# Patient Record
Sex: Male | Born: 1965 | Race: White | Hispanic: No | Marital: Single | Smoking: Never smoker
Health system: Southern US, Community
[De-identification: ages and names within clinical notes are randomized; demographics above are authoritative.]

## PROBLEM LIST (undated history)

## (undated) DIAGNOSIS — E538 Deficiency of other specified B group vitamins: Secondary | ICD-10-CM

## (undated) DIAGNOSIS — F1010 Alcohol abuse, uncomplicated: Secondary | ICD-10-CM

## (undated) DIAGNOSIS — F1910 Other psychoactive substance abuse, uncomplicated: Secondary | ICD-10-CM

## (undated) DIAGNOSIS — K635 Polyp of colon: Secondary | ICD-10-CM

## (undated) DIAGNOSIS — I219 Acute myocardial infarction, unspecified: Secondary | ICD-10-CM

## (undated) DIAGNOSIS — B009 Herpesviral infection, unspecified: Secondary | ICD-10-CM

## (undated) HISTORY — PX: BLADDER SURGERY: SHX569

## (undated) HISTORY — DX: Polyp of colon: K63.5

## (undated) HISTORY — DX: Deficiency of other specified B group vitamins: E53.8

## (undated) HISTORY — DX: Herpesviral infection, unspecified: B00.9

## (undated) HISTORY — PX: KNEE ARTHROSCOPY: SHX127

---

## 2005-12-21 ENCOUNTER — Emergency Department (HOSPITAL_COMMUNITY): Admission: EM | Admit: 2005-12-21 | Discharge: 2005-12-21 | Payer: Self-pay | Admitting: Emergency Medicine

## 2012-03-19 ENCOUNTER — Emergency Department: Payer: Self-pay | Admitting: Internal Medicine

## 2014-07-30 DIAGNOSIS — K635 Polyp of colon: Secondary | ICD-10-CM

## 2014-07-30 HISTORY — DX: Polyp of colon: K63.5

## 2015-06-02 ENCOUNTER — Other Ambulatory Visit: Payer: Self-pay | Admitting: Gastroenterology

## 2015-06-02 DIAGNOSIS — K921 Melena: Secondary | ICD-10-CM

## 2015-06-09 ENCOUNTER — Ambulatory Visit
Admission: RE | Admit: 2015-06-09 | Discharge: 2015-06-09 | Disposition: A | Discharge status: Home / Self Care | Payer: 59 | Source: Ambulatory Visit | Attending: Gastroenterology | Admitting: Gastroenterology

## 2015-06-09 DIAGNOSIS — K921 Melena: Secondary | ICD-10-CM | POA: Insufficient documentation

## 2015-06-09 DIAGNOSIS — Z7289 Other problems related to lifestyle: Secondary | ICD-10-CM | POA: Diagnosis present

## 2015-06-09 DIAGNOSIS — K760 Fatty (change of) liver, not elsewhere classified: Secondary | ICD-10-CM | POA: Insufficient documentation

## 2015-07-06 HISTORY — PX: COLONOSCOPY: SHX174

## 2015-07-06 HISTORY — PX: UPPER GASTROINTESTINAL ENDOSCOPY: SHX188

## 2016-03-21 ENCOUNTER — Encounter: Payer: Self-pay | Admitting: *Deleted

## 2016-03-22 ENCOUNTER — Ambulatory Visit (INDEPENDENT_AMBULATORY_CARE_PROVIDER_SITE_OTHER): Payer: No Typology Code available for payment source | Admitting: General Surgery

## 2016-03-22 ENCOUNTER — Encounter: Payer: Self-pay | Admitting: General Surgery

## 2016-03-22 VITALS — BP 112/80 | HR 88 | Resp 14 | Ht 75.0 in | Wt 200.0 lb

## 2016-03-22 DIAGNOSIS — K6289 Other specified diseases of anus and rectum: Secondary | ICD-10-CM

## 2016-03-22 DIAGNOSIS — R198 Other specified symptoms and signs involving the digestive system and abdomen: Secondary | ICD-10-CM

## 2016-03-22 NOTE — Patient Instructions (Addendum)
The patient is aware to call back for any questions or concerns.  The patient is scheduled for surgery at [facility] on 03/28/16. He will pre admit by phone, the patent is aware of date and instructions.

## 2016-03-22 NOTE — Progress Notes (Addendum)
Patient ID: Edward [name], male   DOB: 03/01/1966, 49 y.o.   MRN: [id number]  Chief Complaint  Patient presents with  . Rectal Problems    mass    HPI Edward [name] is a 49 y.o. male.  Here today for evaluation of palpable anal mass. He states he noticed a mass about 1-2 months ago after using a port a potty. He states it is about the size of jelly bean. He took Bactrim but it did not help. Denies rectal pain. He is here today with Edward [name], girlfriend.   HPI  Past Medical History:  Diagnosis Date  . Colon polyp 2016  . Herpes   . Vitamin B 12 deficiency     Past Surgical History:  Procedure Laterality Date  . BLADDER SURGERY    . COLONOSCOPY  07/06/2015   Dr Rein  . KNEE ARTHROSCOPY Left   . UPPER GASTROINTESTINAL ENDOSCOPY  07/06/2015    Family History  Problem Relation Age of Onset  . Colon cancer Maternal Grandmother   . Prostate cancer Paternal Uncle   . Leukemia Paternal Uncle     Social History Social History  Substance Use Topics  . Smoking status: Never Smoker  . Smokeless tobacco: Never Used  . Alcohol use Yes    Allergies  Allergen Reactions  . Codeine Nausea And Vomiting    Current Outpatient Prescriptions  Medication Sig Dispense Refill  . co-enzyme Q-10 30 MG capsule Take 30 mg by mouth daily.    . Omega-3 Fatty Acids (FISH OIL) 1000 MG CPDR Take by mouth.    . vitamin B-12 (CYANOCOBALAMIN) 500 MCG tablet Take 500 mcg by mouth daily.    . Zinc Sulfate (ZINC-220 PO) Take by mouth.     No current facility-administered medications for this visit.     Review of Systems Review of Systems  Constitutional: Negative.   Respiratory: Negative.     Blood pressure 112/80, pulse 88, resp. rate 14, height 6' 3" (1.905 m), weight 200 lb (90.7 kg).  Physical Exam Physical Exam  Constitutional: He is oriented to person, place, and time. He appears well-developed and well-nourished.  HENT:  Mouth/Throat: Oropharynx is clear and moist.  Eyes:  Conjunctivae are normal. No scleral icterus.  Neck: Neck supple.  Cardiovascular: Normal rate, regular rhythm and normal heart sounds.   Pulmonary/Chest: Effort normal and breath sounds normal.  Abdominal: Soft. Normal appearance and bowel sounds are normal. There is no tenderness.  Genitourinary:  Genitourinary Comments: 1 cm firm smooth perianal mass right side 9 o'clock   Lymphadenopathy:    He has no cervical adenopathy.  Neurological: He is alert and oriented to person, place, and time.  Skin: Skin is warm and dry.  Psychiatric: His behavior is normal.    Data Reviewed Progress notes, prior colonoscopy.  Assessment    Perianal mass-uncertain nature    Plan    Recommend excision. Pt is agreeable.    The patient is scheduled for surgery at [facility] on 03/28/16. He will pre admit by phone, the patent is aware of date and instructions.   This information has been scribed by Marsha Hatch RN, BSN,BC.    SANKAR,SEEPLAPUTHUR G 03/23/2016, 10:01 AM   

## 2016-03-23 ENCOUNTER — Encounter: Payer: Self-pay | Admitting: General Surgery

## 2016-03-26 ENCOUNTER — Other Ambulatory Visit: Payer: PRIVATE HEALTH INSURANCE

## 2016-03-26 ENCOUNTER — Encounter: Payer: Self-pay | Admitting: *Deleted

## 2016-03-26 NOTE — Patient Instructions (Signed)
  Your procedure is scheduled on: 03/28/16 Report to Day Surgery. [facility]OND FLOOR To find out your arrival time please call [phone] between 1PM - 3PM on  03/27/16  Remember: Instructions that are not followed completely may result in serious medical risk, up to and including death, or upon the discretion of your surgeon and anesthesiologist your surgery may need to be rescheduled.    _X___ 1. Do not eat food or drink liquids after midnight. No gum chewing or hard candies.     __X__ 2. No Alcohol for 24 hours before or after surgery.   _X___ 3. Do Not Smoke For 24 Hours Prior to Your Surgery.   ____ 4. Bring all medications with you on the day of surgery if instructed.    __X__ 5. Notify your doctor if there is any change in your medical condition     (cold, fever, infections).       Do not wear jewelry, make-up, hairpins, clips or nail polish.  Do not wear lotions, powders, or perfumes. You may wear deodorant.  Do not shave 48 hours prior to surgery. Men may shave face and neck.  Do not bring valuables to the hospital.    [facility] is not responsible for any belongings or valuables.               Contacts, dentures or bridgework may not be worn into surgery.  Leave your suitcase in the car. After surgery it may be brought to your room.  For patients admitted to the hospital, discharge time is determined by your                treatment team.   Patients discharged the day of surgery will not be allowed to drive home.   Please read over the following fact sheets that you were given:   Surgical Site Infection Prevention   ____ Take these medicines the morning of surgery with A SIP OF WATER:    1. NONE  2.   3.   4.  5.  6.  __X__ Fleet Enema (as directed) 1-2 HOURS BEFORE COMING TO HOSPITAL AM SURGERY  __X__ Use CHG Soap as directed  ____ Use inhalers on the day of surgery  ____ Stop metformin 2 days prior to surgery    ____ Take 1/2 of usual insulin  dose the night before surgery and none on the morning of surgery.   ____ Stop Coumadin/Plavix/aspirin on   ____ Stop Anti-inflammatories on   _X___ Stop supplements until after surgery.    ____ Bring C-Pap to the hospital.

## 2016-03-27 ENCOUNTER — Encounter
Admission: RE | Admit: 2016-03-27 | Discharge: 2016-03-27 | Disposition: A | Discharge status: Home / Self Care | Payer: No Typology Code available for payment source | Source: Ambulatory Visit | Attending: General Surgery | Admitting: General Surgery

## 2016-03-28 ENCOUNTER — Ambulatory Visit: Payer: No Typology Code available for payment source | Admitting: Anesthesiology

## 2016-03-28 ENCOUNTER — Ambulatory Visit
Admission: RE | Admit: 2016-03-28 | Discharge: 2016-03-28 | Disposition: A | Discharge status: Home / Self Care | Payer: No Typology Code available for payment source | Source: Ambulatory Visit | Attending: General Surgery | Admitting: General Surgery

## 2016-03-28 ENCOUNTER — Encounter: Payer: Self-pay | Admitting: *Deleted

## 2016-03-28 ENCOUNTER — Encounter
Admission: RE | Disposition: A | Discharge status: Home / Self Care | Payer: Self-pay | Source: Ambulatory Visit | Attending: General Surgery

## 2016-03-28 DIAGNOSIS — K603 Anal fistula: Secondary | ICD-10-CM | POA: Diagnosis not present

## 2016-03-28 DIAGNOSIS — K6289 Other specified diseases of anus and rectum: Secondary | ICD-10-CM

## 2016-03-28 DIAGNOSIS — Z539 Procedure and treatment not carried out, unspecified reason: Secondary | ICD-10-CM | POA: Insufficient documentation

## 2016-03-28 LAB — URINE DRUG SCREEN, QUALITATIVE (ARMC ONLY)
Amphetamines, Ur Screen: NOT DETECTED
BARBITURATES, UR SCREEN: NOT DETECTED
Benzodiazepine, Ur Scrn: NOT DETECTED
COCAINE METABOLITE, UR ~~LOC~~: POSITIVE — AB
Cannabinoid 50 Ng, Ur ~~LOC~~: POSITIVE — AB
MDMA (ECSTASY) UR SCREEN: NOT DETECTED
METHADONE SCREEN, URINE: NOT DETECTED
Opiate, Ur Screen: NOT DETECTED
Phencyclidine (PCP) Ur S: NOT DETECTED
TRICYCLIC, UR SCREEN: NOT DETECTED

## 2016-03-28 SURGERY — EXCISION MASS LOWER EXTREMITIES
Anesthesia: General

## 2016-03-28 MED ORDER — BACITRACIN ZINC 500 UNIT/GM EX OINT
TOPICAL_OINTMENT | CUTANEOUS | Status: AC
  Filled 2016-03-28: qty 28.35

## 2016-03-28 MED ORDER — LACTATED RINGERS IV SOLN: INTRAVENOUS | Status: DC

## 2016-03-28 MED ORDER — FAMOTIDINE 20 MG PO TABS
20.0000 mg | ORAL_TABLET | Freq: Once | ORAL | Status: AC
  Administered 2016-03-28: 20 mg via ORAL

## 2016-03-28 MED ORDER — BUPIVACAINE-EPINEPHRINE (PF) 0.5% -1:200000 IJ SOLN
INTRAMUSCULAR | Status: AC
  Filled 2016-03-28: qty 30

## 2016-03-28 MED ORDER — FLEET ENEMA 7-19 GM/118ML RE ENEM: 1.0000 | ENEMA | Freq: Once | RECTAL | Status: DC

## 2016-03-28 MED ORDER — FAMOTIDINE 20 MG PO TABS
ORAL_TABLET | ORAL | Status: AC
  Filled 2016-03-28: qty 1

## 2016-03-28 SURGICAL SUPPLY — 23 items
BRIEF STRETCH MATERNITY 2XLG (MISCELLANEOUS) ×3 IMPLANT
CANISTER SUCT 1200ML W/VALVE (MISCELLANEOUS) ×3 IMPLANT
DRAPE LAPAROTOMY 77X122 PED (DRAPES) ×3 IMPLANT
DRAPE LEGGINS SURG 28X43 STRL (DRAPES) ×3 IMPLANT
DRAPE UNDER BUTTOCK W/FLU (DRAPES) ×3 IMPLANT
ELECT REM PT RETURN 9FT ADLT (ELECTROSURGICAL) ×3
ELECTRODE REM PT RTRN 9FT ADLT (ELECTROSURGICAL) ×1 IMPLANT
GAUZE SPONGE 4X4 12PLY STRL (GAUZE/BANDAGES/DRESSINGS) ×3 IMPLANT
GLOVE BIO SURGEON STRL SZ7 (GLOVE) ×3 IMPLANT
GOWN STRL REUS W/ TWL LRG LVL3 (GOWN DISPOSABLE) ×2 IMPLANT
GOWN STRL REUS W/TWL LRG LVL3 (GOWN DISPOSABLE) ×4
KIT RM TURNOVER CYSTO AR (KITS) ×3 IMPLANT
LABEL OR SOLS (LABEL) ×3 IMPLANT
NEEDLE HYPO 25X1 1.5 SAFETY (NEEDLE) ×3 IMPLANT
NS IRRIG 500ML POUR BTL (IV SOLUTION) ×3 IMPLANT
PACK BASIN MINOR ARMC (MISCELLANEOUS) IMPLANT
PAD ABD DERMACEA PRESS 5X9 (GAUZE/BANDAGES/DRESSINGS) ×3 IMPLANT
PAD PREP 24X41 OB/GYN DISP (PERSONAL CARE ITEMS) ×3 IMPLANT
SOL PREP PVP 2OZ (MISCELLANEOUS) ×3
SOLUTION PREP PVP 2OZ (MISCELLANEOUS) ×1 IMPLANT
SURGILUBE 2OZ TUBE FLIPTOP (MISCELLANEOUS) ×3 IMPLANT
SUT CHROMIC 0 SH (SUTURE) IMPLANT
SYRINGE 10CC LL (SYRINGE) ×3 IMPLANT

## 2016-03-28 NOTE — H&P (View-Only) (Signed)
Patient ID: Edward [name], male   DOB: 09/05/1965, 49 y.o.   MRN: [id number]  Chief Complaint  Patient presents with  . Rectal Problems    mass    HPI Edward [name] is a 49 y.o. male.  Here today for evaluation of palpable anal mass. He states he noticed a mass about 1-2 months ago after using a port a potty. He states it is about the size of jelly bean. He took Bactrim but it did not help. Denies rectal pain. He is here today with Edward [name], girlfriend.   HPI  Past Medical History:  Diagnosis Date  . Colon polyp 2016  . Herpes   . Vitamin B 12 deficiency     Past Surgical History:  Procedure Laterality Date  . BLADDER SURGERY    . COLONOSCOPY  07/06/2015   Dr Rein  . KNEE ARTHROSCOPY Left   . UPPER GASTROINTESTINAL ENDOSCOPY  07/06/2015    Family History  Problem Relation Age of Onset  . Colon cancer Maternal Grandmother   . Prostate cancer Paternal Uncle   . Leukemia Paternal Uncle     Social History Social History  Substance Use Topics  . Smoking status: Never Smoker  . Smokeless tobacco: Never Used  . Alcohol use Yes    Allergies  Allergen Reactions  . Codeine Nausea And Vomiting    Current Outpatient Prescriptions  Medication Sig Dispense Refill  . co-enzyme Q-10 30 MG capsule Take 30 mg by mouth daily.    . Omega-3 Fatty Acids (FISH OIL) 1000 MG CPDR Take by mouth.    . vitamin B-12 (CYANOCOBALAMIN) 500 MCG tablet Take 500 mcg by mouth daily.    . Zinc Sulfate (ZINC-220 PO) Take by mouth.     No current facility-administered medications for this visit.     Review of Systems Review of Systems  Constitutional: Negative.   Respiratory: Negative.     Blood pressure 112/80, pulse 88, resp. rate 14, height 6' 3" (1.905 m), weight 200 lb (90.7 kg).  Physical Exam Physical Exam  Constitutional: He is oriented to person, place, and time. He appears well-developed and well-nourished.  HENT:  Mouth/Throat: Oropharynx is clear and moist.  Eyes:  Conjunctivae are normal. No scleral icterus.  Neck: Neck supple.  Cardiovascular: Normal rate, regular rhythm and normal heart sounds.   Pulmonary/Chest: Effort normal and breath sounds normal.  Abdominal: Soft. Normal appearance and bowel sounds are normal. There is no tenderness.  Genitourinary:  Genitourinary Comments: 1 cm firm smooth perianal mass right side 9 o'clock   Lymphadenopathy:    He has no cervical adenopathy.  Neurological: He is alert and oriented to person, place, and time.  Skin: Skin is warm and dry.  Psychiatric: His behavior is normal.    Data Reviewed Progress notes, prior colonoscopy.  Assessment    Perianal mass-uncertain nature    Plan    Recommend excision. Pt is agreeable.    The patient is scheduled for surgery at [facility] on 03/28/16. He will pre admit by phone, the patent is aware of date and instructions.   This information has been scribed by Marsha Hatch RN, BSN,BC.    SANKAR,SEEPLAPUTHUR G 03/23/2016, 10:01 AM   

## 2016-03-28 NOTE — Progress Notes (Signed)
+  UDS for cocaine. Dr. Evette Cristal and anesthesia notified. Both spoke with patient and family. Surgery cancelled and will be rescheduled per MD and patient. IV DCed and patient left ambulatory with family.

## 2016-03-28 NOTE — Interval H&P Note (Signed)
History and Physical Interval Note:  03/28/2016 2:24 PM  Edward [name]  has presented today for surgery, with the diagnosis of PERIANAL MASS  The various methods of treatment have been discussed with the patient and family. After consideration of risks, benefits and other options for treatment, the patient has consented to  Procedure(s): EXCISION MASS PERIANAL (N/A) as a surgical intervention .  The patient's history has been reviewed, patient examined, no change in status, stable for surgery.  I have reviewed the patient's chart and labs.  Questions were answered to the patient's satisfaction.     [name]

## 2016-03-28 NOTE — Anesthesia Preprocedure Evaluation (Deleted)
Anesthesia Evaluation  Patient identified by MRN, date of birth, ID band Patient awake    Reviewed: Allergy & Precautions, NPO status , Patient's Chart, lab work & pertinent test results  History of Anesthesia Complications Negative for: history of anesthetic complications  Airway Mallampati: II       Dental  (+) Teeth Intact   Pulmonary neg pulmonary ROS,    breath sounds clear to auscultation       Cardiovascular negative cardio ROS   Rhythm:Regular     Neuro/Psych negative neurological ROS     GI/Hepatic negative GI ROS, Neg liver ROS,   Endo/Other  negative endocrine ROS  Renal/GU negative Renal ROS     Musculoskeletal negative musculoskeletal ROS (+)   Abdominal Normal abdominal exam  (+)   Peds negative pediatric ROS (+)  Hematology negative hematology ROS (+)   Anesthesia Other Findings   Reproductive/Obstetrics                             Anesthesia Physical Anesthesia Plan  ASA: II  Anesthesia Plan: General   Post-op Pain Management:    Induction: Intravenous  Airway Management Planned: LMA  Additional Equipment:   Intra-op Plan:   Post-operative Plan: Extubation in OR  Informed Consent: I have reviewed the patients History and Physical, chart, labs and discussed the procedure including the risks, benefits and alternatives for the proposed anesthesia with the patient or authorized representative who has indicated his/her understanding and acceptance.     Plan Discussed with: CRNA  Anesthesia Plan Comments:         Anesthesia Quick Evaluation

## 2016-03-30 ENCOUNTER — Other Ambulatory Visit: Payer: Self-pay | Admitting: General Surgery

## 2016-03-30 DIAGNOSIS — K6289 Other specified diseases of anus and rectum: Secondary | ICD-10-CM

## 2016-04-03 ENCOUNTER — Encounter: Payer: Self-pay | Admitting: *Deleted

## 2016-04-03 ENCOUNTER — Ambulatory Visit
Admission: RE | Admit: 2016-04-03 | Discharge: 2016-04-03 | Disposition: A | Discharge status: Home / Self Care | Payer: No Typology Code available for payment source | Source: Ambulatory Visit | Attending: General Surgery | Admitting: General Surgery

## 2016-04-03 ENCOUNTER — Ambulatory Visit: Payer: No Typology Code available for payment source | Admitting: Certified Registered Nurse Anesthetist

## 2016-04-03 ENCOUNTER — Encounter
Admission: RE | Disposition: A | Discharge status: Home / Self Care | Payer: Self-pay | Source: Ambulatory Visit | Attending: General Surgery

## 2016-04-03 DIAGNOSIS — Z806 Family history of leukemia: Secondary | ICD-10-CM | POA: Insufficient documentation

## 2016-04-03 DIAGNOSIS — Z800 Family history of malignant neoplasm of digestive organs: Secondary | ICD-10-CM | POA: Diagnosis not present

## 2016-04-03 DIAGNOSIS — Z885 Allergy status to narcotic agent status: Secondary | ICD-10-CM | POA: Insufficient documentation

## 2016-04-03 DIAGNOSIS — E538 Deficiency of other specified B group vitamins: Secondary | ICD-10-CM | POA: Diagnosis not present

## 2016-04-03 DIAGNOSIS — K6289 Other specified diseases of anus and rectum: Secondary | ICD-10-CM

## 2016-04-03 DIAGNOSIS — K603 Anal fistula: Secondary | ICD-10-CM | POA: Insufficient documentation

## 2016-04-03 DIAGNOSIS — Z79899 Other long term (current) drug therapy: Secondary | ICD-10-CM | POA: Diagnosis not present

## 2016-04-03 DIAGNOSIS — B009 Herpesviral infection, unspecified: Secondary | ICD-10-CM | POA: Diagnosis not present

## 2016-04-03 DIAGNOSIS — Z86010 Personal history of colonic polyps: Secondary | ICD-10-CM | POA: Diagnosis not present

## 2016-04-03 DIAGNOSIS — Z9889 Other specified postprocedural states: Secondary | ICD-10-CM | POA: Insufficient documentation

## 2016-04-03 HISTORY — PX: ANAL FISTULOTOMY: SHX6423

## 2016-04-03 LAB — URINE DRUG SCREEN, QUALITATIVE (ARMC ONLY)
AMPHETAMINES, UR SCREEN: NOT DETECTED
BENZODIAZEPINE, UR SCRN: NOT DETECTED
Barbiturates, Ur Screen: NOT DETECTED
COCAINE METABOLITE, UR ~~LOC~~: NOT DETECTED
Cannabinoid 50 Ng, Ur ~~LOC~~: POSITIVE — AB
MDMA (Ecstasy)Ur Screen: NOT DETECTED
METHADONE SCREEN, URINE: NOT DETECTED
OPIATE, UR SCREEN: NOT DETECTED
Phencyclidine (PCP) Ur S: NOT DETECTED
Tricyclic, Ur Screen: NOT DETECTED

## 2016-04-03 SURGERY — ANAL FISTULOTOMY
Anesthesia: Monitor Anesthesia Care | Wound class: Clean Contaminated

## 2016-04-03 MED ORDER — ONDANSETRON HCL 4 MG/2ML IJ SOLN: 4.0000 mg | Freq: Once | INTRAMUSCULAR | Status: DC | PRN

## 2016-04-03 MED ORDER — FLEET ENEMA 7-19 GM/118ML RE ENEM
1.0000 | ENEMA | Freq: Once | RECTAL | Status: DC
Start: 2016-04-04 — End: 2016-04-03

## 2016-04-03 MED ORDER — FAMOTIDINE 20 MG PO TABS
20.0000 mg | ORAL_TABLET | Freq: Once | ORAL | Status: AC
  Administered 2016-04-03: 20 mg via ORAL

## 2016-04-03 MED ORDER — BACITRACIN 500 UNIT/GM EX OINT
TOPICAL_OINTMENT | CUTANEOUS | Status: DC | PRN
  Administered 2016-04-03: 1 via TOPICAL

## 2016-04-03 MED ORDER — BUPIVACAINE-EPINEPHRINE (PF) 0.5% -1:200000 IJ SOLN
INTRAMUSCULAR | Status: AC
  Filled 2016-04-03: qty 30

## 2016-04-03 MED ORDER — HYDRALAZINE HCL 20 MG/ML IJ SOLN
10.0000 mg | Freq: Once | INTRAMUSCULAR | Status: AC
  Administered 2016-04-03: 10 mg via INTRAVENOUS

## 2016-04-03 MED ORDER — FENTANYL CITRATE (PF) 100 MCG/2ML IJ SOLN
INTRAMUSCULAR | Status: DC | PRN
  Administered 2016-04-03 (×2): 25 ug via INTRAVENOUS
  Administered 2016-04-03: 50 ug via INTRAVENOUS

## 2016-04-03 MED ORDER — FENTANYL CITRATE (PF) 100 MCG/2ML IJ SOLN: 25.0000 ug | INTRAMUSCULAR | Status: DC | PRN

## 2016-04-03 MED ORDER — LIDOCAINE HCL (CARDIAC) 20 MG/ML IV SOLN
INTRAVENOUS | Status: DC | PRN
  Administered 2016-04-03: 60 mg via INTRAVENOUS

## 2016-04-03 MED ORDER — BUPIVACAINE-EPINEPHRINE (PF) 0.5% -1:200000 IJ SOLN
INTRAMUSCULAR | Status: DC | PRN
  Administered 2016-04-03: 15 mL via PERINEURAL

## 2016-04-03 MED ORDER — PROPOFOL 10 MG/ML IV BOLUS
INTRAVENOUS | Status: DC | PRN
  Administered 2016-04-03: 200 mg via INTRAVENOUS

## 2016-04-03 MED ORDER — LACTATED RINGERS IV SOLN
INTRAVENOUS | Status: DC
  Administered 2016-04-03 (×2): via INTRAVENOUS

## 2016-04-03 MED ORDER — KETOROLAC TROMETHAMINE 30 MG/ML IJ SOLN
INTRAMUSCULAR | Status: DC | PRN
  Administered 2016-04-03: 30 mg via INTRAVENOUS

## 2016-04-03 MED ORDER — OXYCODONE-ACETAMINOPHEN 5-325 MG PO TABS: 1.0000 | ORAL_TABLET | ORAL | 0 refills | Status: DC | PRN

## 2016-04-03 MED ORDER — HYDRALAZINE HCL 20 MG/ML IJ SOLN
INTRAMUSCULAR | Status: AC
  Administered 2016-04-03: 10 mg via INTRAVENOUS
  Filled 2016-04-03: qty 1

## 2016-04-03 MED ORDER — MIDAZOLAM HCL 5 MG/5ML IJ SOLN
INTRAMUSCULAR | Status: DC | PRN
  Administered 2016-04-03: 2 mg via INTRAVENOUS

## 2016-04-03 MED ORDER — FAMOTIDINE 20 MG PO TABS
ORAL_TABLET | ORAL | Status: AC
  Filled 2016-04-03: qty 1

## 2016-04-03 MED ORDER — ONDANSETRON HCL 4 MG/2ML IJ SOLN
INTRAMUSCULAR | Status: DC | PRN
  Administered 2016-04-03: 4 mg via INTRAVENOUS

## 2016-04-03 MED ORDER — BACITRACIN ZINC 500 UNIT/GM EX OINT
TOPICAL_OINTMENT | CUTANEOUS | Status: AC
  Filled 2016-04-03: qty 28.35

## 2016-04-03 SURGICAL SUPPLY — 23 items
BRIEF STRETCH MATERNITY 2XLG (MISCELLANEOUS) ×3 IMPLANT
CANISTER SUCT 1200ML W/VALVE (MISCELLANEOUS) ×3 IMPLANT
DRAPE LAPAROTOMY 77X122 PED (DRAPES) ×3 IMPLANT
DRAPE LEGGINS SURG 28X43 STRL (DRAPES) ×3 IMPLANT
DRAPE UNDER BUTTOCK W/FLU (DRAPES) ×3 IMPLANT
ELECT REM PT RETURN 9FT ADLT (ELECTROSURGICAL) ×3
ELECTRODE REM PT RTRN 9FT ADLT (ELECTROSURGICAL) ×2 IMPLANT
GAUZE SPONGE 4X4 12PLY STRL (GAUZE/BANDAGES/DRESSINGS) IMPLANT
GLOVE BIO SURGEON STRL SZ7 (GLOVE) ×18 IMPLANT
GOWN STRL REUS W/ TWL LRG LVL3 (GOWN DISPOSABLE) ×6 IMPLANT
GOWN STRL REUS W/TWL LRG LVL3 (GOWN DISPOSABLE) ×3
KIT RM TURNOVER CYSTO AR (KITS) ×3 IMPLANT
LABEL OR SOLS (LABEL) ×3 IMPLANT
NEEDLE HYPO 25X1 1.5 SAFETY (NEEDLE) ×3 IMPLANT
NS IRRIG 500ML POUR BTL (IV SOLUTION) ×3 IMPLANT
PACK BASIN MINOR ARMC (MISCELLANEOUS) ×3 IMPLANT
PAD ABD DERMACEA PRESS 5X9 (GAUZE/BANDAGES/DRESSINGS) ×3 IMPLANT
PAD PREP 24X41 OB/GYN DISP (PERSONAL CARE ITEMS) ×3 IMPLANT
SOL PREP PVP 2OZ (MISCELLANEOUS) ×3
SOLUTION PREP PVP 2OZ (MISCELLANEOUS) ×2 IMPLANT
SURGILUBE 2OZ TUBE FLIPTOP (MISCELLANEOUS) ×3 IMPLANT
SUT CHROMIC 0 SH (SUTURE) IMPLANT
SYRINGE 10CC LL (SYRINGE) ×3 IMPLANT

## 2016-04-03 NOTE — Anesthesia Preprocedure Evaluation (Signed)
Anesthesia Evaluation  Patient identified by MRN, date of birth, ID band Patient awake    Reviewed: Allergy & Precautions, H&P , NPO status , Patient's Chart, lab work & pertinent test results, reviewed documented beta blocker date and time   Airway Mallampati: II  TM Distance: >3 FB Neck ROM: full    Dental no notable dental hx. (+) Teeth Intact   Pulmonary neg pulmonary ROS,    Pulmonary exam normal breath sounds clear to auscultation       Cardiovascular Exercise Tolerance: Good hypertension, negative cardio ROS   Rhythm:regular Rate:Normal     Neuro/Psych negative neurological ROS  negative psych ROS   GI/Hepatic negative GI ROS, Neg liver ROS,   Endo/Other  negative endocrine ROSdiabetes  Renal/GU      Musculoskeletal   Abdominal   Peds  Hematology negative hematology ROS (+)   Anesthesia Other Findings   Reproductive/Obstetrics negative OB ROS                             Anesthesia Physical Anesthesia Plan  ASA: II  Anesthesia Plan: MAC   Post-op Pain Management:    Induction:   Airway Management Planned:   Additional Equipment:   Intra-op Plan:   Post-operative Plan:   Informed Consent: I have reviewed the patients History and Physical, chart, labs and discussed the procedure including the risks, benefits and alternatives for the proposed anesthesia with the patient or authorized representative who has indicated his/her understanding and acceptance.     Plan Discussed with: CRNA  Anesthesia Plan Comments:         Anesthesia Quick Evaluation  

## 2016-04-03 NOTE — Progress Notes (Signed)
Final dose of hydralazine given  Heart rate 51  Blood pressure 148/98

## 2016-04-03 NOTE — Progress Notes (Signed)
Heart rate ranging from mid 40's to low to mid 50's  Blood pressure DBP ranging mid 80" to 90"   Dr Pernell Dupre ordered hydralalzine 10 mg to follow with 10mg  in 15 minutes

## 2016-04-03 NOTE — H&P (View-Only) (Signed)
Patient ID: Edward [name], male   DOB: 05-27-1966, 50 y.o.   MRN: [id number]  Chief Complaint  Patient presents with  . Rectal Problems    mass    HPI MICHOLAS [name] is a 50 y.o. male.  Here today for evaluation of palpable anal mass. He states he noticed a mass about 1-2 months ago after using a port a potty. He states it is about the size of jelly bean. He took Bactrim but it did not help. Denies rectal pain. He is here today with [name], girlfriend.   HPI  Past Medical History:  Diagnosis Date  . Colon polyp 2016  . Herpes   . Vitamin B 12 deficiency     Past Surgical History:  Procedure Laterality Date  . BLADDER SURGERY    . COLONOSCOPY  07/06/2015   Dr Shelle Iron  . KNEE ARTHROSCOPY Left   . UPPER GASTROINTESTINAL ENDOSCOPY  07/06/2015    Family History  Problem Relation Age of Onset  . Colon cancer Maternal Grandmother   . Prostate cancer Paternal Uncle   . Leukemia Paternal Uncle     Social History Social History  Substance Use Topics  . Smoking status: Never Smoker  . Smokeless tobacco: Never Used  . Alcohol use Yes    Allergies  Allergen Reactions  . Codeine Nausea And Vomiting    Current Outpatient Prescriptions  Medication Sig Dispense Refill  . co-enzyme Q-10 30 MG capsule Take 30 mg by mouth daily.    . Omega-3 Fatty Acids (FISH OIL) 1000 MG CPDR Take by mouth.    . vitamin B-12 (CYANOCOBALAMIN) 500 MCG tablet Take 500 mcg by mouth daily.    . Zinc Sulfate (ZINC-220 PO) Take by mouth.     No current facility-administered medications for this visit.     Review of Systems Review of Systems  Constitutional: Negative.   Respiratory: Negative.     Blood pressure 112/80, pulse 88, resp. rate 14, height 6\' 3"  (1.905 m), weight 200 lb (90.7 kg).  Physical Exam Physical Exam  Constitutional: He is oriented to person, place, and time. He appears well-developed and well-nourished.  HENT:  Mouth/Throat: Oropharynx is clear and moist.  Eyes:  Conjunctivae are normal. No scleral icterus.  Neck: Neck supple.  Cardiovascular: Normal rate, regular rhythm and normal heart sounds.   Pulmonary/Chest: Effort normal and breath sounds normal.  Abdominal: Soft. Normal appearance and bowel sounds are normal. There is no tenderness.  Genitourinary:  Genitourinary Comments: 1 cm firm smooth perianal mass right side 9 o'clock   Lymphadenopathy:    He has no cervical adenopathy.  Neurological: He is alert and oriented to person, place, and time.  Skin: Skin is warm and dry.  Psychiatric: His behavior is normal.    Data Reviewed Progress notes, prior colonoscopy.  Assessment    Perianal mass-uncertain nature    Plan    Recommend excision. Pt is agreeable.    The patient is scheduled for surgery at [facility] on 03/28/16. He will pre admit by phone, the patent is aware of date and instructions.   This information has been scribed by Dorathy Daft RN, BSN,BC.    SANKAR,SEEPLAPUTHUR G 03/23/2016, 10:01 AM

## 2016-04-03 NOTE — Anesthesia Procedure Notes (Signed)
Procedure Name: LMA Insertion Date/Time: 04/03/2016 11:42 AM Performed by: Lily Kocher Pre-anesthesia Checklist: Patient identified, Patient being monitored, Timeout performed, Emergency Drugs available and Suction available Patient Re-evaluated:Patient Re-evaluated prior to inductionOxygen Delivery Method: Circle system utilized Preoxygenation: Pre-oxygenation with 100% oxygen Intubation Type: IV induction Ventilation: Mask ventilation without difficulty LMA: LMA inserted LMA Size: 5.0 Tube type: Oral Number of attempts: 1 Placement Confirmation: positive ETCO2 and breath sounds checked- equal and bilateral Tube secured with: Tape Dental Injury: Teeth and Oropharynx as per pre-operative assessment

## 2016-04-03 NOTE — Discharge Instructions (Signed)

## 2016-04-03 NOTE — Transfer of Care (Signed)
Immediate Anesthesia Transfer of Care Note  Patient: Edward [name]  Procedure(s) Performed: Procedure(s): ANAL FISTULOTOMY  Patient Location: PACU  Anesthesia Type:General  Level of Consciousness: awake  Airway & Oxygen Therapy: Patient Spontanous Breathing  Post-op Assessment: Report given to RN  Post vital signs: stable  Last Vitals:  Vitals:   04/03/16 1024  BP: (!) 166/106  Pulse: 60  Resp: 18  Temp: 37.1 C    Last Pain:  Vitals:   04/03/16 1024  TempSrc: Oral         Complications: No apparent anesthesia complications 

## 2016-04-03 NOTE — Anesthesia Postprocedure Evaluation (Signed)
Anesthesia Post Note  Patient: Edward [name]  Procedure(s) Performed: Procedure(s): ANAL FISTULOTOMY  Patient location during evaluation: PACU Anesthesia Type: General Level of consciousness: awake and alert Pain management: pain level controlled Vital Signs Assessment: post-procedure vital signs reviewed and stable Respiratory status: spontaneous breathing, nonlabored ventilation, respiratory function stable and patient connected to nasal cannula oxygen Cardiovascular status: blood pressure returned to baseline and stable Postop Assessment: no signs of nausea or vomiting Anesthetic complications: no    Last Vitals:  Vitals:   04/03/16 1359 04/03/16 1402  BP: (!) 142/78 (!) 161/81  Pulse: 68 77  Resp: 15 16  Temp: 36.5 C     Last Pain:  Vitals:   04/03/16 1024  TempSrc: Oral                 Yevette Edwards

## 2016-04-03 NOTE — H&P (Signed)
  Pt was scheduled for surgery last week but tested positive for cocaine and surgery was deferred. Pt was advised then on harms of drugs that may cause problems during anesthesia. Pt returns today for the same planned procedure Repeat test shows he is negative for cocaine. Still positive for cannabinoids

## 2016-04-03 NOTE — Progress Notes (Signed)
Heart rate 55   Blood pressure 134/87  Dr Pernell Dupre aware no  New orders

## 2016-04-03 NOTE — Progress Notes (Signed)
Pt. States area to his rectum has gone away, States it burst while he was working a few days Ago, states area is scabbed over.

## 2016-04-03 NOTE — Op Note (Signed)
Preop diagnosis: Perianal mass  Post op diagnosis: Submucous perianal fistula  Operation: Anal fistulotomy  Surgeon: Reina Fuse are  Assistant:     Anesthesia: Gen.  Complications: None  EBL: Minimal  Drains: None  Description: Patient was put to sleep in supine position the operating table and after placed in lithotomy. The anal area was prepped and draped as sterile field and timeout performed. The patient had a palpable 1 cm subcutaneous mass located just to the right of the anal canal but this morning he reported that he had become much smaller and he is not short of fluid drained any. With the careful distal palpation speculum exam and palpation of the skin in the 8:00 location where the mass was felt it was apparent the patient had a small fistulous tract with an obvious internal opening that was easily identified. Accordingly the skin was opened near the external end of this fistulous tract and then a probe was inserted into the tract through the internal opening in this was noted be pink predominantly submucous and subcutaneous the fistula was then laid open completely and cauterized. There did not appear to be any other abnormality in the anterolateral region. The wound was covered with the bacitracin ointment and dressed patient subsequently extubated and returned recovery room stable condition.

## 2016-04-03 NOTE — Interval H&P Note (Signed)
History and Physical Interval Note:  04/03/2016 11:16 AM  Edward [name]  has presented today for surgery, with the diagnosis of PERIANAL MASS  The various methods of treatment have been discussed with the patient and family. After consideration of risks, benefits and other options for treatment, the patient has consented to  Procedure(s): EXCISION PERIANAL MASS (N/A) as a surgical intervention .  The patient's history has been reviewed, patient examined, no change in status, stable for surgery.  I have reviewed the patient's chart and labs.  Questions were answered to the patient's satisfaction.     SANKAR,SEEPLAPUTHUR G

## 2016-04-03 NOTE — Transfer of Care (Signed)
Immediate Anesthesia Transfer of Care Note  Patient: Edward [name]  Procedure(s) Performed: Procedure(s): ANAL FISTULOTOMY  Patient Location: PACU  Anesthesia Type:General  Level of Consciousness: awake  Airway & Oxygen Therapy: Patient Spontanous Breathing  Post-op Assessment: Report given to RN  Post vital signs: stable  Last Vitals:  Vitals:   04/03/16 1024  BP: (!) 166/106  Pulse: 60  Resp: 18  Temp: 37.1 C    Last Pain:  Vitals:   04/03/16 1024  TempSrc: Oral         Complications: No apparent anesthesia complications

## 2016-04-10 ENCOUNTER — Encounter: Payer: Self-pay | Admitting: General Surgery

## 2016-04-10 ENCOUNTER — Ambulatory Visit (INDEPENDENT_AMBULATORY_CARE_PROVIDER_SITE_OTHER): Payer: No Typology Code available for payment source | Admitting: General Surgery

## 2016-04-10 VITALS — BP 140/80 | HR 68 | Resp 12 | Ht 75.0 in | Wt 205.0 lb

## 2016-04-10 DIAGNOSIS — K603 Anal fistula: Secondary | ICD-10-CM

## 2016-04-10 NOTE — Patient Instructions (Signed)
The patient is aware to call back for any questions or concerns.  

## 2016-04-10 NOTE — Progress Notes (Signed)
Patient ID: Edward [name], male   DOB: November 13, 1965, 50 y.o.   MRN: [id number]  Chief Complaint  Patient presents with  . Routine Post Op    HPI Edward [name] is a 50 y.o. male.  Here today for postop visit, anal fistulotomy. Still bleeding some with BM. Bowels are back to routine. Otherwise doing well.  HPI  Past Medical History:  Diagnosis Date  . Colon polyp 2016  . Herpes   . Vitamin B 12 deficiency     Past Surgical History:  Procedure Laterality Date  . ANAL FISTULOTOMY  04/03/2016   Procedure: ANAL FISTULOTOMY;  Surgeon: Kieth Brightly, MD;  Location: [facility] ORS;  Service: General;;  . BLADDER SURGERY    . COLONOSCOPY  07/06/2015   Dr Shelle Iron  . KNEE ARTHROSCOPY Left   . UPPER GASTROINTESTINAL ENDOSCOPY  07/06/2015    Family History  Problem Relation Age of Onset  . Colon cancer Maternal Grandmother   . Prostate cancer Paternal Uncle   . Leukemia Paternal Uncle     Social History Social History  Substance Use Topics  . Smoking status: Never Smoker  . Smokeless tobacco: Never Used  . Alcohol use Yes     Comment: Occasionally     Allergies  Allergen Reactions  . Codeine Nausea And Vomiting    Current Outpatient Prescriptions  Medication Sig Dispense Refill  . co-enzyme Q-10 30 MG capsule Take 30 mg by mouth daily.    . Omega-3 Fatty Acids (FISH OIL) 1000 MG CPDR Take by mouth.    . vitamin B-12 (CYANOCOBALAMIN) 500 MCG tablet Take 500 mcg by mouth daily.    . Zinc Sulfate (ZINC-220 PO) Take by mouth.     No current facility-administered medications for this visit.     Review of Systems Review of Systems  Respiratory: Negative.   Cardiovascular: Negative.   Neurological: Positive for weakness.    Blood pressure 140/80, pulse 68, resp. rate 12, height 6\' 3"  (1.905 m), weight 205 lb (93 kg).  Physical Exam Physical Exam  Constitutional: He is oriented to person, place, and time. He appears well-developed and well-nourished.  Genitourinary:   Genitourinary Comments: Fistulotomy site healing well. No signs of infection noted.  Neurological: He is alert and oriented to person, place, and time.  Skin: Skin is warm and dry.  Psychiatric: His behavior is normal.    Data Reviewed Prior notes.  Assessment    Stable postop exam.    Plan    Follow up one month May return to work      The patient is aware to call back for any questions or concerns. This information has been scribed by Dorathy Daft RN, BSN,BC.   SANKAR,SEEPLAPUTHUR G 04/11/2016, 7:48 PM

## 2016-04-11 ENCOUNTER — Encounter: Payer: Self-pay | Admitting: General Surgery

## 2016-05-09 ENCOUNTER — Ambulatory Visit (INDEPENDENT_AMBULATORY_CARE_PROVIDER_SITE_OTHER): Payer: No Typology Code available for payment source | Admitting: General Surgery

## 2016-05-09 ENCOUNTER — Encounter: Payer: Self-pay | Admitting: General Surgery

## 2016-05-09 VITALS — BP 140/80 | HR 82 | Resp 12 | Ht 71.0 in | Wt 203.0 lb

## 2016-05-09 DIAGNOSIS — K603 Anal fistula: Secondary | ICD-10-CM

## 2016-05-09 NOTE — Progress Notes (Signed)
Patient ID: Edward [name], male   DOB: 04-Mar-1966, 50 y.o.   MRN: [id number]  Chief Complaint  Patient presents with  . Routine Post Op    anal fiaaure    HPI Edward [name] is a 50 y.o. male here today for his post op anal fistulotomy done on 04/03/2016. Patient states he is doing well. No bleeding or pain.  I have reviewed the history of present illness with the patient.  HPI  Past Medical History:  Diagnosis Date  . Colon polyp 2016  . Herpes   . Vitamin B 12 deficiency     Past Surgical History:  Procedure Laterality Date  . ANAL FISTULOTOMY  04/03/2016   Procedure: ANAL FISTULOTOMY;  Surgeon: Kieth Brightly, MD;  Location: [facility] ORS;  Service: General;;  . BLADDER SURGERY    . COLONOSCOPY  07/06/2015   Dr Shelle Iron  . KNEE ARTHROSCOPY Left   . UPPER GASTROINTESTINAL ENDOSCOPY  07/06/2015    Family History  Problem Relation Age of Onset  . Colon cancer Maternal Grandmother   . Prostate cancer Paternal Uncle   . Leukemia Paternal Uncle     Social History Social History  Substance Use Topics  . Smoking status: Never Smoker  . Smokeless tobacco: Never Used  . Alcohol use Yes     Comment: Occasionally     Allergies  Allergen Reactions  . Codeine Nausea And Vomiting    Current Outpatient Prescriptions  Medication Sig Dispense Refill  . co-enzyme Q-10 30 MG capsule Take 30 mg by mouth daily.    . Omega-3 Fatty Acids (FISH OIL) 1000 MG CPDR Take by mouth.    . vitamin B-12 (CYANOCOBALAMIN) 500 MCG tablet Take 500 mcg by mouth daily.    . Zinc Sulfate (ZINC-220 PO) Take by mouth.     No current facility-administered medications for this visit.     Review of Systems Review of Systems  Constitutional: Negative.   Respiratory: Negative.   Cardiovascular: Negative.     Blood pressure 140/80, pulse 82, resp. rate 12, height 5\' 11"  (1.803 m), weight 203 lb (92.1 kg).  Physical Exam Physical Exam  Constitutional: He is oriented to person, place, and time.  He appears well-developed and well-nourished.  Neurological: He is alert and oriented to person, place, and time.  Skin: Skin is warm.  Anal exqm shows fistulotomy site is fully healed. No new findings  Data Reviewed Progress notes  Assessment    Anal fistula- resolved    Plan    Follow up as needed. The patient is aware to call back for any questions or concerns.      This information has been scribed by Ples Specter CMA.  Endora Teresi G 05/14/2016, 11:29 AM

## 2016-05-09 NOTE — Patient Instructions (Addendum)
Patient to return as needed. The patient is aware to call back for any questions or concerns. 

## 2016-05-14 ENCOUNTER — Encounter: Payer: Self-pay | Admitting: General Surgery

## 2018-06-23 ENCOUNTER — Ambulatory Visit (INDEPENDENT_AMBULATORY_CARE_PROVIDER_SITE_OTHER): Payer: No Typology Code available for payment source | Admitting: Nurse Practitioner

## 2018-06-23 ENCOUNTER — Encounter (INDEPENDENT_AMBULATORY_CARE_PROVIDER_SITE_OTHER): Payer: Self-pay | Admitting: Nurse Practitioner

## 2018-06-23 DIAGNOSIS — R209 Unspecified disturbances of skin sensation: Secondary | ICD-10-CM | POA: Diagnosis not present

## 2018-06-23 DIAGNOSIS — I8312 Varicose veins of left lower extremity with inflammation: Secondary | ICD-10-CM

## 2018-06-23 DIAGNOSIS — I8311 Varicose veins of right lower extremity with inflammation: Secondary | ICD-10-CM | POA: Diagnosis not present

## 2018-06-23 DIAGNOSIS — G629 Polyneuropathy, unspecified: Secondary | ICD-10-CM | POA: Diagnosis not present

## 2018-06-23 DIAGNOSIS — I8393 Asymptomatic varicose veins of bilateral lower extremities: Secondary | ICD-10-CM | POA: Insufficient documentation

## 2018-06-23 NOTE — Progress Notes (Signed)
Subjective:    Patient ID: Edward [name], male    DOB: 07-28-1966, 52 y.o.   MRN: [id number] Chief Complaint  Patient presents with  . New Patient (Initial Visit)    ref Pointer for Varicose veins    HPI  Edward [name] is a 52 y.o. male that is referred by his primary care provider Pointer, FNP.  The patient states that his bilateral toes become extremely cold and numb, and somewhat painful.  He states that the pain is worse when he is laying down or sitting.  The pain is better when he walks.  He also states that missing his dosages of Neurontin also make the pain worse.  He states that following an increase in his Neurontin it helped his numbness.  He states that this is been going on for several months.  He denies any claudication-like symptoms.  Patient also has a history of varicose veins.  He denies any significant issues with his varicose veins.  He states that generally in the morning they are better but they do swell more during the evening.  Varicose veins generally do not cause him pain or swelling.  The patient denies a history of degenerative spine disease.  The patient denies rest pain. The patient denies dangling off the affected extremity during the night for pain relief. There are no open wounds or sores at this time.  No prior vascular interventions or vascular surgeries.  The patient denies amaurosis fugax or recent TIA symptoms. There are no recent neurological changes noted. The patient denies history of DVT, PE or superficial thrombophlebitis. The patient denies recent episodes of angina or shortness of breath.    Past Medical History:  Diagnosis Date  . Colon polyp 2016  . Herpes   . Vitamin B 12 deficiency     Past Surgical History:  Procedure Laterality Date  . ANAL FISTULOTOMY  04/03/2016   Procedure: ANAL FISTULOTOMY;  Surgeon: Kieth Brightly, MD;  Location: [facility] OR[facility];  Service: General;;  . BLADDER SURGERY    . COLONOSCOPY  07/06/2015   Dr Shelle Iron    . KNEE ARTHROSCOPY Left   . UPPER GASTROINTESTINAL ENDOSCOPY  07/06/2015    Social History   Socioeconomic History  . Marital status: Single    Spouse name: Not on file  . Number of children: Not on file  . Years of education: Not on file  . Highest education level: Not on file  Occupational History  . Not on file  Social Needs  . Financial resource strain: Not on file  . Food insecurity:    Worry: Not on file    Inability: Not on file  . Transportation needs:    Medical: Not on file    Non-medical: Not on file  Tobacco Use  . Smoking status: Never Smoker  . Smokeless tobacco: Never Used  Substance and Sexual Activity  . Alcohol use: Yes    Comment: Occasionally   . Drug use: Yes    Types: Marijuana  . Sexual activity: Not on file  Lifestyle  . Physical activity:    Days per week: Not on file    Minutes per session: Not on file  . Stress: Not on file  Relationships  . Social connections:    Talks on phone: Not on file    Gets together: Not on file    Attends religious service: Not on file    Active member of club or organization: Not on file  Attends meetings of clubs or organizations: Not on file    Relationship status: Not on file  . Intimate partner violence:    Fear of current or ex partner: Not on file    Emotionally abused: Not on file    Physically abused: Not on file    Forced sexual activity: Not on file  Other Topics Concern  . Not on file  Social History Narrative  . Not on file    Family History  Problem Relation Age of Onset  . Colon cancer Maternal Grandmother   . Prostate cancer Paternal Uncle   . Leukemia Paternal Uncle     Allergies  Allergen Reactions  . Codeine Nausea And Vomiting     Review of Systems   Review of Systems: Negative Unless Checked Constitutional: [] Weight loss  [] Fever  [] Chills Cardiac: [] Chest pain   []  Atrial Fibrillation  [] Palpitations   [] Shortness of breath when laying flat   [] Shortness of breath  with exertion. Vascular:  [] Pain in legs with walking   [] Pain in legs with standing  [] History of DVT   [] Phlebitis   [] Swelling in legs   [x] Varicose veins   [] Non-healing ulcers Pulmonary:   [] Uses home oxygen   [] Productive cough   [] Hemoptysis   [] Wheeze  [] COPD   [] Asthma Neurologic:  [] Dizziness   [] Seizures   [] History of stroke   [] History of TIA  [] Aphasia   [] Vissual changes   [] Weakness or numbness in arm   [x] Weakness or numbness in leg Musculoskeletal:   [] Joint swelling   [] Joint pain   [] Low back pain  []  History of Knee Replacement Hematologic:  [] Easy bruising  [] Easy bleeding   [] Hypercoagulable state   [] Anemic Gastrointestinal:  [] Diarrhea   [] Vomiting  [] Gastroesophageal reflux/heartburn   [] Difficulty swallowing. Genitourinary:  [] Chronic kidney disease   [] Difficult urination  [] Anuric   [] Blood in urine Skin:  [] Rashes   [] Ulcers  Psychological:  [] History of anxiety   []  History of major depression  []  Memory Difficulties     Objective:   Physical Exam  BP (!) 149/90 (BP Location: Right Arm)   Pulse 67   Resp 16   Ht 6\' 3"  (1.905 m)   Wt 194 lb (88 kg)   BMI 24.25 kg/m   Gen: WD/WN, NAD Head: [place]/AT, No temporalis wasting.  Ear/Nose/Throat: Hearing grossly intact, nares w/o erythema or drainage Eyes: PER, EOMI, sclera nonicteric.  Neck: Supple, no masses.  No JVD.  Pulmonary:  Good air movement, no use of accessory muscles.  Cardiac: RRR Vascular:  Vessel Right Left  Radial Palpable Palpable  Dorsalis Pedis Palpable Palpable  Posterior Tibial Palpable Palpable   Gastrointestinal: soft, non-distended. No guarding/no peritoneal signs.  Musculoskeletal: M/S 5/5 throughout.  No deformity or atrophy.  Neurologic: Pain and light touch intact in extremities.  Symmetrical.  Speech is fluent. Motor exam as listed above. Psychiatric: Judgment intact, Mood & affect appropriate for pt's clinical situation. Dermatologic: No Venous rashes. No Ulcers Noted.  No  changes consistent with cellulitis. Lymph : No Cervical lymphadenopathy, no lichenification or skin changes of chronic lymphedema.      Assessment & Plan:   1. Neuropathy Patient has a current history of neuropathy for which she is currently taking gabapentin.  His current issues may be a worsening of his neuropathy.  See below plan.  2. Varicose veins of both lower extremities with inflammation  Recommend:  The patient has large symptomatic varicose veins that are painful and associated with  swelling.  I have had a long discussion with the patient regarding  varicose veins and why they cause symptoms.  Patient will begin wearing graduated compression stockings class 1 on a daily basis, beginning first thing in the morning and removing them in the evening. The patient is instructed specifically not to sleep in the stockings.    The patient  will also begin using over-the-counter analgesics such as Motrin 600 mg po TID to help control the symptoms.    In addition, behavioral modification including elevation during the day will be initiated.    Pending the results of these changes the  patient will be reevaluated in three months.   An  ultrasound of the venous system will be obtained.   Further plans will be based on the ultrasound results and whether conservative therapies are successful at eliminating the pain and swelling.  - VAS [place] LOWER EXTREMITY VENOUS REFLUX; Future  3. Bilateral cold feet Based upon the patient's description of pain as well as relief factors it sounds as if it could be an exacerbation of his neuropathy.  We will obtain bilateral ABIs with toe digit PPG's in order to assess blood flow to his lower extremities.  If all is normal, we will have patient follow-up with his primary care provider for further treatment of his neuropathy.  Patient understands and agrees with plan. - VAS [place] ABI WITH/WO TBI; Future    Current Outpatient Medications on File Prior to Visit    Medication Sig Dispense Refill  . gabapentin (NEURONTIN) 100 MG capsule     . meloxicam (MOBIC) 7.5 MG tablet 1 BY MOUTH DAILY FOR JOINT PAIN  5  . vitamin B-12 (CYANOCOBALAMIN) 500 MCG tablet Take 500 mcg by mouth daily.    Marland Kitchen co-enzyme Q-10 30 MG capsule Take 30 mg by mouth daily.    . Omega-3 Fatty Acids (FISH OIL) 1000 MG CPDR Take by mouth.    . Zinc Sulfate (ZINC-220 PO) Take by mouth.     No current facility-administered medications on file prior to visit.     There are no Patient Instructions on file for this visit. No follow-ups on file.   Georgiana Spinner, NP  This note was completed with Office manager.  Any errors are purely unintentional.

## 2018-07-17 ENCOUNTER — Encounter (INDEPENDENT_AMBULATORY_CARE_PROVIDER_SITE_OTHER): Payer: Self-pay

## 2018-07-17 ENCOUNTER — Ambulatory Visit (INDEPENDENT_AMBULATORY_CARE_PROVIDER_SITE_OTHER): Payer: No Typology Code available for payment source

## 2018-07-17 ENCOUNTER — Ambulatory Visit (INDEPENDENT_AMBULATORY_CARE_PROVIDER_SITE_OTHER): Payer: No Typology Code available for payment source | Admitting: Vascular Surgery

## 2018-07-17 DIAGNOSIS — R209 Unspecified disturbances of skin sensation: Secondary | ICD-10-CM | POA: Diagnosis not present

## 2018-07-17 DIAGNOSIS — I8311 Varicose veins of right lower extremity with inflammation: Secondary | ICD-10-CM

## 2018-07-17 DIAGNOSIS — I8312 Varicose veins of left lower extremity with inflammation: Secondary | ICD-10-CM

## 2018-07-31 ENCOUNTER — Ambulatory Visit (INDEPENDENT_AMBULATORY_CARE_PROVIDER_SITE_OTHER): Payer: No Typology Code available for payment source | Admitting: Vascular Surgery

## 2018-08-07 ENCOUNTER — Encounter (INDEPENDENT_AMBULATORY_CARE_PROVIDER_SITE_OTHER): Payer: Self-pay | Admitting: Vascular Surgery

## 2018-08-07 ENCOUNTER — Ambulatory Visit (INDEPENDENT_AMBULATORY_CARE_PROVIDER_SITE_OTHER): Payer: No Typology Code available for payment source | Admitting: Vascular Surgery

## 2018-08-07 VITALS — BP 144/92 | HR 95 | Resp 16 | Ht 75.0 in | Wt 196.0 lb

## 2018-08-07 DIAGNOSIS — I8393 Asymptomatic varicose veins of bilateral lower extremities: Secondary | ICD-10-CM

## 2018-08-07 DIAGNOSIS — G629 Polyneuropathy, unspecified: Secondary | ICD-10-CM | POA: Diagnosis not present

## 2018-08-07 DIAGNOSIS — M79605 Pain in left leg: Secondary | ICD-10-CM

## 2018-08-07 DIAGNOSIS — M79606 Pain in leg, unspecified: Secondary | ICD-10-CM | POA: Insufficient documentation

## 2018-08-07 DIAGNOSIS — M79604 Pain in right leg: Secondary | ICD-10-CM | POA: Diagnosis not present

## 2018-08-07 NOTE — Progress Notes (Signed)
MRN : [id number]  Edward [name] is a 53 y.o. ([birth year]) male who presents with chief complaint of  Chief Complaint  Patient presents with  . Follow-up  .  History of Present Illness:   The patient returns to the office for followup and review of the noninvasive studies. There have been no interval changes in lower extremity symptoms. No interval shortening of the patient's claudication distance or development of rest pain symptoms. No new ulcers or wounds have occurred since the last visit.  There have been no significant changes to the patient's overall health care.  The patient denies amaurosis fugax or recent TIA symptoms. There are no recent neurological changes noted. The patient denies history of DVT, PE or superficial thrombophlebitis. The patient denies recent episodes of angina or shortness of breath.   ABI Rt=1.23 and Lt=1.26   Duplex ultrasound of the lower extremity venous system is normal no acute/chronic DVT no reflux   Current Meds  Medication Sig  . co-enzyme Q-10 30 MG capsule Take 30 mg by mouth daily.  Marland Kitchen. gabapentin (NEURONTIN) 100 MG capsule   . meloxicam (MOBIC) 7.5 MG tablet 1 BY MOUTH DAILY FOR JOINT PAIN  . Omega-3 Fatty Acids (FISH OIL) 1000 MG CPDR Take by mouth.  . vitamin B-12 (CYANOCOBALAMIN) 500 MCG tablet Take 500 mcg by mouth daily.  . Zinc Sulfate (ZINC-220 PO) Take by mouth.    Past Medical History:  Diagnosis Date  . Colon polyp 2016  . Herpes   . Vitamin B 12 deficiency     Past Surgical History:  Procedure Laterality Date  . ANAL FISTULOTOMY  04/03/2016   Procedure: ANAL FISTULOTOMY;  Surgeon: Kieth BrightlySeeplaputhur G Sankar, MD;  Location: [facility] ORS;  Service: General;;  . BLADDER SURGERY    . COLONOSCOPY  07/06/2015   Dr Shelle Ironein  . KNEE ARTHROSCOPY Left   . UPPER GASTROINTESTINAL ENDOSCOPY  07/06/2015    Social History Social History   Tobacco Use  . Smoking status: Never Smoker  . Smokeless tobacco: Never Used  Substance Use Topics   . Alcohol use: Yes    Comment: Occasionally   . Drug use: Yes    Types: Marijuana    Family History Family History  Problem Relation Age of Onset  . Colon cancer Maternal Grandmother   . Prostate cancer Paternal Uncle   . Leukemia Paternal Uncle     Allergies  Allergen Reactions  . Codeine Nausea And Vomiting     REVIEW OF SYSTEMS (Negative unless checked)  Constitutional: [] Weight loss  [] Fever  [] Chills Cardiac: [] Chest pain   [] Chest pressure   [] Palpitations   [] Shortness of breath when laying flat   [] Shortness of breath with exertion. Vascular:  [] Pain in legs with walking   [x] Pain in legs at rest  [] History of DVT   [] Phlebitis   [] Swelling in legs   [] Varicose veins   [] Non-healing ulcers Pulmonary:   [] Uses home oxygen   [] Productive cough   [] Hemoptysis   [] Wheeze  [] COPD   [] Asthma Neurologic:  [] Dizziness   [] Seizures   [] History of stroke   [] History of TIA  [] Aphasia   [] Vissual changes   [] Weakness or numbness in arm   [x] Weakness or numbness in leg Musculoskeletal:   [] Joint swelling   [] Joint pain   [] Low back pain Hematologic:  [] Easy bruising  [] Easy bleeding   [] Hypercoagulable state   [] Anemic Gastrointestinal:  [] Diarrhea   [] Vomiting  [] Gastroesophageal reflux/heartburn   [] Difficulty swallowing. Genitourinary:  []   Chronic kidney disease   [] Difficult urination  [] Frequent urination   [] Blood in urine Skin:  [] Rashes   [] Ulcers  Psychological:  [] History of anxiety   []  History of major depression.  Physical Examination  Vitals:   08/07/18 1603  BP: (!) 144/92  Pulse: 95  Resp: 16  Weight: 196 lb (88.9 kg)  Height: 6\' 3"  (1.905 m)   Body mass index is 24.5 kg/m. Gen: WD/WN, NAD Head: /AT, No temporalis wasting.  Ear/Nose/Throat: Hearing grossly intact, nares w/o erythema or drainage Eyes: PER, EOMI, sclera nonicteric.  Neck: Supple, no large masses.   Pulmonary:  Good air movement, no audible wheezing bilaterally, no use of accessory  muscles.  Cardiac: RRR, no JVD Vascular:  Vessel Right Left  Radial Palpable Palpable  Gastrointestinal: Non-distended. No guarding/no peritoneal signs.  Musculoskeletal: M/S 5/5 throughout.  No deformity or atrophy.  Neurologic: CN 2-12 intact. Symmetrical.  Speech is fluent. Motor exam as listed above. Psychiatric: Judgment intact, Mood & affect appropriate for pt's clinical situation. Dermatologic: No rashes or ulcers noted.  No changes consistent with cellulitis. Lymph : No lichenification or skin changes of chronic lymphedema.  CBC No results found for: WBC, HGB, HCT, MCV, PLT  BMET No results found for: NA, K, CL, CO2, GLUCOSE, BUN, CREATININE, CALCIUM, GFRNONAA, GFRAA CrCl cannot be calculated (No successful lab value found.).  COAG No results found for: INR, PROTIME  Radiology Vas [place] Abi With/wo Tbi  Result Date: 07/17/2018 LOWER EXTREMITY DOPPLER STUDY Indications: Cold toes.  Comparison Study: none Performing Technologist: Salvadore Farber RVT  Examination Guidelines: A complete evaluation includes at minimum, Doppler waveform signals and systolic blood pressure reading at the level of bilateral brachial, anterior tibial, and posterior tibial arteries, when vessel segments are accessible. Bilateral testing is considered an integral part of a complete examination. Photoelectric Plethysmograph (PPG) waveforms and toe systolic pressure readings are included as required and additional duplex testing as needed. Limited examinations for reoccurring indications may be performed as noted.  ABI Findings: +---------+------------------+-----+---------+--------+ Right    Rt Pressure (mmHg)IndexWaveform Comment  +---------+------------------+-----+---------+--------+ Brachial 138                                      +---------+------------------+-----+---------+--------+ ATA      170               1.23 triphasic         +---------+------------------+-----+---------+--------+ PTA       163               1.18 triphasic         +---------+------------------+-----+---------+--------+ Great Toe126               0.91 Normal            +---------+------------------+-----+---------+--------+ +---------+------------------+-----+---------+-------+ Left     Lt Pressure (mmHg)IndexWaveform Comment +---------+------------------+-----+---------+-------+ Brachial 134                                     +---------+------------------+-----+---------+-------+ ATA      174               1.26 triphasic        +---------+------------------+-----+---------+-------+ PTA      169               1.22 triphasic        +---------+------------------+-----+---------+-------+  Great Toe150               1.09                  +---------+------------------+-----+---------+-------+ +-------+-----------+-----------+------------+------------+ ABI/TBIToday's ABIToday's TBIPrevious ABIPrevious TBI +-------+-----------+-----------+------------+------------+ Right  1.23                                           +-------+-----------+-----------+------------+------------+ Left   1.26                                           +-------+-----------+-----------+------------+------------+ TOES Findings: +----------+---------------+--------+-------+ Right ToesPressure (mmHg)WaveformComment +----------+---------------+--------+-------+ 1st Digit                Normal          +----------+---------------+--------+-------+ 2nd Digit                Normal          +----------+---------------+--------+-------+ 3rd Digit                Normal          +----------+---------------+--------+-------+ 4th Digit                Normal          +----------+---------------+--------+-------+ 5th Digit                Normal          +----------+---------------+--------+-------+ +---------+---------------+--------+-------+ Left ToesPressure (mmHg)WaveformComment  +---------+---------------+--------+-------+ 1st Digit               Normal          +---------+---------------+--------+-------+ 2nd Digit               Normal          +---------+---------------+--------+-------+ 3rd Digit               Normal          +---------+---------------+--------+-------+ 4th Digit               Normal          +---------+---------------+--------+-------+ 5th Digit               Normal          +---------+---------------+--------+-------+  Summary: Right: Resting right ankle-brachial index is within normal range. No evidence of significant right lower extremity arterial disease. The right toe-brachial index is normal. Left: Resting left ankle-brachial index is within normal range. No evidence of significant left lower extremity arterial disease. The left toe-brachial index is normal.  *See table(s) above for measurements and observations.  Electronically signed by Levora Dredge MD on 07/17/2018 at 5:22:43 PM.    Final    Vas [place] Lower Extremity Venous Reflux  Result Date: 07/17/2018  Lower Venous Reflux Study Other Indications: Cold toes. Comparison Study: none Performing Technologist: Salvadore Farber RVT  Examination Guidelines: A complete evaluation includes B-mode imaging, spectral Doppler, color Doppler, and power Doppler as needed of all accessible portions of each vessel. Bilateral testing is considered an integral part of a complete examination. Limited examinations for reoccurring indications may be performed as noted. The reflux portion of the exam is performed with the patient in reverse Trendelenburg.  Right Venous Findings: +---------+---------------+---------+-----------+----------+-------+  CompressibilityPhasicitySpontaneityPropertiesSummary +---------+---------------+---------+-----------+----------+-------+ CFV      Full                                                  +---------+---------------+---------+-----------+----------+-------+ SFJ      Full                                                 +---------+---------------+---------+-----------+----------+-------+ FV Prox  Full                                                 +---------+---------------+---------+-----------+----------+-------+ FV Mid   Full                                                 +---------+---------------+---------+-----------+----------+-------+ FV DistalFull                                                 +---------+---------------+---------+-----------+----------+-------+ POP      Full                                                 +---------+---------------+---------+-----------+----------+-------+ GSV      Full                                                 +---------+---------------+---------+-----------+----------+-------+ SSV      Full                                                 +---------+---------------+---------+-----------+----------+-------+                         Yes                                   +---------+---------------+---------+-----------+----------+-------+  Left Venous Findings: +---------+---------------+---------+-----------+----------+-------+          CompressibilityPhasicitySpontaneityPropertiesSummary +---------+---------------+---------+-----------+----------+-------+ CFV      Full                                                 +---------+---------------+---------+-----------+----------+-------+ SFJ      Full                                                 +---------+---------------+---------+-----------+----------+-------+  FV Prox  Full                                                 +---------+---------------+---------+-----------+----------+-------+ FV Mid   Full                                                  +---------+---------------+---------+-----------+----------+-------+ FV DistalFull                                                 +---------+---------------+---------+-----------+----------+-------+ POP      Full                                                 +---------+---------------+---------+-----------+----------+-------+ GSV      Full                                                 +---------+---------------+---------+-----------+----------+-------+ SSV      Full                                                 +---------+---------------+---------+-----------+----------+-------+                         Yes                                   +---------+---------------+---------+-----------+----------+-------+    Summary: Right: There is no evidence of deep vein thrombosis in the lower extremity.There is no evidence of chronic venous insufficiency.There is no evidence of superficial venous thrombosis. Left: There is no evidence of deep vein thrombosis in the lower extremity.There is no evidence of chronic venous insufficiency.There is no evidence of superficial venous thrombosis.  *See table(s) above for measurements and observations. Electronically signed by Levora Dredge MD on 07/17/2018 at 5:22:38 PM.    Final      Assessment/Plan 1. Pain in both lower extremities Recommend:  I do not find evidence of Vascular pathology that would explain the patient's symptoms  The patient has atypical pain symptoms for vascular disease  Noninvasive studies including venous ultrasound of the legs do not identify vascular problems  The patient should continue walking and begin a more formal exercise program. The patient should continue his antiplatelet therapy and aggressive treatment of the lipid abnormalities. The patient should begin wearing graduated compression socks 15-20 mmHg strength to control her mild edema.  Patient will follow-up with me on a PRN  basis  Further work-up of her lower extremity pain is deferred to the primary service     2. Asymptomatic varicose veins of both lower extremities  Recommend:  The patient is complaining of varicose veins.    I have had a long discussion with the patient regarding  varicose veins and why they cause symptoms.  Patient will begin wearing graduated compression stockings on a daily basis, beginning first thing in the morning and removing them in the evening. The patient is instructed specifically not to sleep in the stockings.    The patient  will also begin using over-the-counter analgesics such as Motrin 600 mg po TID to help control the symptoms as needed.    In addition, behavioral modification including elevation during the day will be initiated, utilizing a recliner was recommended.  The patient is also instructed to continue exercising such as walking 4-5 times per week.  At this time the patient wishes to continue conservative therapy and is not interested in more invasive treatments such as laser ablation and sclerotherapy.  The Patient will follow up PRN if the symptoms worsen.  3. Neuropathy See #1    Levora Dredge, MD  08/07/2018 4:08 PM

## 2018-08-11 ENCOUNTER — Other Ambulatory Visit (INDEPENDENT_AMBULATORY_CARE_PROVIDER_SITE_OTHER): Payer: Self-pay | Admitting: Vascular Surgery

## 2018-08-11 ENCOUNTER — Telehealth (INDEPENDENT_AMBULATORY_CARE_PROVIDER_SITE_OTHER): Payer: Self-pay | Admitting: Vascular Surgery

## 2018-08-11 DIAGNOSIS — M79605 Pain in left leg: Principal | ICD-10-CM

## 2018-08-11 DIAGNOSIS — G629 Polyneuropathy, unspecified: Secondary | ICD-10-CM

## 2018-08-11 DIAGNOSIS — M79604 Pain in right leg: Secondary | ICD-10-CM

## 2018-08-11 NOTE — Telephone Encounter (Signed)
I have placed a referral to Venetia Night, MD (neurosurgeon) as per Dr. Marijean Heath request.

## 2018-08-11 NOTE — Telephone Encounter (Signed)
Patient is aware and was given phone number to office to call if he wants to expedite the appointment making process, [phone].

## 2018-08-11 NOTE — Telephone Encounter (Signed)
I am unaware of referrals. Please advise on below

## 2018-08-11 NOTE — Telephone Encounter (Signed)
Patient left message on my voicemail inquiring about an MRI. After looking the patient up in the system I called for clarification as there was no indication of an order for an MRI. Upon speaking with Edward [name] he was in fact asking about a referral to "[facility]"not being sure who that is. I advised that I would contact the staff to inquired.

## 2018-08-11 NOTE — Telephone Encounter (Signed)
Called number left below [phone] (home) no answer and unable to leave voicemail. Will try again later

## 2018-12-08 ENCOUNTER — Emergency Department
Admission: EM | Admit: 2018-12-08 | Discharge: 2018-12-08 | Disposition: A | Discharge status: Home / Self Care | Payer: No Typology Code available for payment source | Attending: Emergency Medicine | Admitting: Emergency Medicine

## 2018-12-08 ENCOUNTER — Other Ambulatory Visit: Payer: Self-pay

## 2018-12-08 ENCOUNTER — Encounter: Payer: Self-pay | Admitting: Emergency Medicine

## 2018-12-08 DIAGNOSIS — Y999 Unspecified external cause status: Secondary | ICD-10-CM | POA: Diagnosis not present

## 2018-12-08 DIAGNOSIS — Z79899 Other long term (current) drug therapy: Secondary | ICD-10-CM | POA: Insufficient documentation

## 2018-12-08 DIAGNOSIS — Y93H3 Activity, building and construction: Secondary | ICD-10-CM | POA: Diagnosis not present

## 2018-12-08 DIAGNOSIS — X58XXXA Exposure to other specified factors, initial encounter: Secondary | ICD-10-CM | POA: Insufficient documentation

## 2018-12-08 DIAGNOSIS — S61411A Laceration without foreign body of right hand, initial encounter: Secondary | ICD-10-CM | POA: Diagnosis not present

## 2018-12-08 DIAGNOSIS — Z23 Encounter for immunization: Secondary | ICD-10-CM | POA: Insufficient documentation

## 2018-12-08 DIAGNOSIS — S6991XA Unspecified injury of right wrist, hand and finger(s), initial encounter: Secondary | ICD-10-CM | POA: Diagnosis present

## 2018-12-08 DIAGNOSIS — Y929 Unspecified place or not applicable: Secondary | ICD-10-CM | POA: Diagnosis not present

## 2018-12-08 MED ORDER — LIDOCAINE HCL 1 % IJ SOLN
5.0000 mL | Freq: Once | INTRAMUSCULAR | Status: AC
  Administered 2018-12-08: 5 mL
  Filled 2018-12-08: qty 10

## 2018-12-08 MED ORDER — CEPHALEXIN 500 MG PO CAPS: 500.0000 mg | ORAL_CAPSULE | Freq: Three times a day (TID) | ORAL | 0 refills | Status: AC

## 2018-12-08 MED ORDER — TETANUS-DIPHTH-ACELL PERTUSSIS 5-2.5-18.5 LF-MCG/0.5 IM SUSP
0.5000 mL | Freq: Once | INTRAMUSCULAR | Status: AC
  Administered 2018-12-08: 21:00:00 0.5 mL via INTRAMUSCULAR
  Filled 2018-12-08: qty 0.5

## 2018-12-08 NOTE — ED Triage Notes (Signed)
Pt states happened 4 hours PTA

## 2018-12-08 NOTE — ED Notes (Signed)
Patient has a cut on right hand between thumb and index finger. Bleeding controled.

## 2018-12-08 NOTE — ED Triage Notes (Signed)
Pt reports was putting fence poles in the ground and rammed hard and cut his right hand. Bleeding controlled at this time.

## 2018-12-08 NOTE — ED Provider Notes (Signed)
[facility] Emergency Department Provider Note  ____________________________________________  Time seen: Approximately 8:43 PM  I have reviewed the triage vital signs and the nursing notes.   HISTORY  Chief Complaint Hand Injury and Laceration    HPI Edward [name] is a 53 y.o. male presents to the emergency department with a 2 cm, linear laceration along the volar aspect of the right hand sustained accidentally when patient was putting in fence posts earlier in the day.  He denies numbness and tingling of the right hand.  Patient's tetanus status is out of date.  No other alleviating measures have been attempted.         Past Medical History:  Diagnosis Date  . Colon polyp 2016  . Herpes   . Vitamin B 12 deficiency     Patient Active Problem List   Diagnosis Date Noted  . Leg pain 08/07/2018  . Neuropathy 06/23/2018  . Varicose veins of both lower extremities 06/23/2018  . Bilateral cold feet 06/23/2018    Past Surgical History:  Procedure Laterality Date  . ANAL FISTULOTOMY  04/03/2016   Procedure: ANAL FISTULOTOMY;  Surgeon: Kieth Brightly, MD;  Location: [facility] ORS;  Service: General;;  . BLADDER SURGERY    . COLONOSCOPY  07/06/2015   Dr Shelle Iron  . KNEE ARTHROSCOPY Left   . UPPER GASTROINTESTINAL ENDOSCOPY  07/06/2015    Prior to Admission medications   Medication Sig Start Date End Date Taking? Authorizing Provider  cephALEXin (KEFLEX) 500 MG capsule Take 1 capsule (500 mg total) by mouth 3 (three) times daily for 7 days. 12/08/18 12/15/18  Orvil Feil, PA-C  co-enzyme Q-10 30 MG capsule Take 30 mg by mouth daily.    [provider]  gabapentin (NEURONTIN) 100 MG capsule  06/19/18   [provider]  meloxicam (MOBIC) 7.5 MG tablet 1 BY MOUTH DAILY FOR JOINT PAIN 05/29/18   [provider]  Omega-3 Fatty Acids (FISH OIL) 1000 MG CPDR Take by mouth.    [provider]  vitamin B-12 (CYANOCOBALAMIN)  500 MCG tablet Take 500 mcg by mouth daily.    [provider]  Zinc Sulfate (ZINC-220 PO) Take by mouth.    [provider]    Allergies Codeine  Family History  Problem Relation Age of Onset  . Colon cancer Maternal Grandmother   . Prostate cancer Paternal Uncle   . Leukemia Paternal Uncle     Social History Social History   Tobacco Use  . Smoking status: Never Smoker  . Smokeless tobacco: Never Used  Substance Use Topics  . Alcohol use: Yes    Comment: Occasionally   . Drug use: Yes    Types: Marijuana     Review of Systems  Constitutional: No fever/chills Eyes: No visual changes. No discharge ENT: No upper respiratory complaints. Cardiovascular: no chest pain. Respiratory: no cough. No SOB. Gastrointestinal: No abdominal pain.  No nausea, no vomiting.  No diarrhea.  No constipation. Genitourinary: Negative for dysuria. No hematuria Musculoskeletal: Negative for musculoskeletal pain. Skin: Patient has laceration of right hand.  Neurological: Negative for headaches, focal weakness or numbness.   ____________________________________________   PHYSICAL EXAM:  VITAL SIGNS: ED Triage Vitals  Enc Vitals Group     BP 12/08/18 1856 135/90     Pulse Rate 12/08/18 1856 79     Resp 12/08/18 1856 20     Temp 12/08/18 1856 98.5 F (36.9 C)     Temp Source 12/08/18 1856  Oral     SpO2 12/08/18 1856 96 %     Weight 12/08/18 1855 200 lb (90.7 kg)     Height 12/08/18 1855 6\' 3"  (1.905 m)     Head Circumference --      Peak Flow --      Pain Score 12/08/18 1855 10     Pain Loc --      Pain Edu? --      Excl. in GC? --      Constitutional: Alert and oriented. Well appearing and in no acute distress. Eyes: Conjunctivae are normal. PERRL. EOMI. Head: Atraumatic. Cardiovascular: Normal rate, regular rhythm. Normal S1 and S2.  Good peripheral circulation. Respiratory: Normal respiratory effort without tachypnea or retractions. Lungs CTAB. Good air  entry to the bases with no decreased or absent breath sounds. Musculoskeletal: Full range of motion to all extremities. No gross deformities appreciated. Neurologic:  Normal speech and language. No gross focal neurologic deficits are appreciated.  Skin: Patient has 2 cm linear laceration of right hand. Psychiatric: Mood and affect are normal. Speech and behavior are normal. Patient exhibits appropriate insight and judgement.   ____________________________________________   LABS (all labs ordered are listed, but only abnormal results are displayed)  Labs Reviewed - No data to display ____________________________________________  EKG   ____________________________________________  RADIOLOGY   No results found.  ____________________________________________    PROCEDURES  Procedure(s) performed:    Procedures  LACERATION REPAIR Performed by: Orvil FeilJaclyn M Taiz Bickle Authorized by: Orvil FeilJaclyn M Buford Gayler Consent: Verbal consent obtained. Risks and benefits: risks, benefits and alternatives were discussed Consent given by: patient Patient identity confirmed: provided demographic data Prepped and Draped in normal sterile fashion Wound explored  Laceration Location: Right hand   Laceration Length: 2 cm  No Foreign Bodies seen or palpated  Anesthesia: local infiltration  Local anesthetic: lidocaine 1% without epinephrine  Anesthetic total: 2 ml  Irrigation method: syringe Amount of cleaning: standard  Skin closure: 4-0 Ethilon   Number of sutures: 4  Technique: Simple Interrupted   Patient tolerance: Patient tolerated the procedure well with no immediate complications.   Medications  lidocaine (XYLOCAINE) 1 % (with pres) injection 5 mL (5 mLs Infiltration Given 12/08/18 2033)  Tdap (BOOSTRIX) injection 0.5 mL (0.5 mLs Intramuscular Given 12/08/18 2033)     ____________________________________________   INITIAL IMPRESSION / ASSESSMENT AND PLAN / ED COURSE  Pertinent  labs & imaging results that were available during my care of the patient were reviewed by me and considered in my medical decision making (see chart for details).  Review of the [place] CSRS was performed in accordance of the [facility] prior to dispensing any controlled drugs.           Assessment and plan Laceration Patient presents to the emergency department with a 2 cm, linear laceration of the right hand.  Laceration was repaired in the emergency department without complication.  Patient was advised to have sutures removed by primary care in 7 days.  He was discharged with Keflex.  All patient questions were answered.    ____________________________________________  FINAL CLINICAL IMPRESSION(S) / ED DIAGNOSES  Final diagnoses:  Laceration of right hand without foreign body, initial encounter      NEW MEDICATIONS STARTED DURING THIS VISIT:  ED Discharge Orders         Ordered    cephALEXin (KEFLEX) 500 MG capsule  3 times daily     12/08/18 2021  This chart was dictated using voice recognition software/Dragon. Despite best efforts to proofread, errors can occur which can change the meaning. Any change was purely unintentional.    Orvil Feil, PA-C 12/08/18 2048    Dionne Bucy, MD 12/08/18 2128

## 2018-12-17 ENCOUNTER — Inpatient Hospital Stay
Admission: EM | Admit: 2018-12-17 | Discharge: 2018-12-20 | DRG: 286 | Disposition: A | Discharge status: Home / Self Care | Payer: No Typology Code available for payment source | Attending: Internal Medicine | Admitting: Internal Medicine

## 2018-12-17 ENCOUNTER — Emergency Department: Payer: No Typology Code available for payment source

## 2018-12-17 ENCOUNTER — Other Ambulatory Visit: Payer: Self-pay

## 2018-12-17 DIAGNOSIS — Z885 Allergy status to narcotic agent status: Secondary | ICD-10-CM

## 2018-12-17 DIAGNOSIS — R0600 Dyspnea, unspecified: Secondary | ICD-10-CM

## 2018-12-17 DIAGNOSIS — R778 Other specified abnormalities of plasma proteins: Secondary | ICD-10-CM

## 2018-12-17 DIAGNOSIS — Z1159 Encounter for screening for other viral diseases: Secondary | ICD-10-CM

## 2018-12-17 DIAGNOSIS — Z7289 Other problems related to lifestyle: Secondary | ICD-10-CM

## 2018-12-17 DIAGNOSIS — R7989 Other specified abnormal findings of blood chemistry: Secondary | ICD-10-CM | POA: Diagnosis not present

## 2018-12-17 DIAGNOSIS — G629 Polyneuropathy, unspecified: Secondary | ICD-10-CM | POA: Diagnosis present

## 2018-12-17 DIAGNOSIS — Z79899 Other long term (current) drug therapy: Secondary | ICD-10-CM

## 2018-12-17 DIAGNOSIS — Z7151 Drug abuse counseling and surveillance of drug abuser: Secondary | ICD-10-CM

## 2018-12-17 DIAGNOSIS — F1010 Alcohol abuse, uncomplicated: Secondary | ICD-10-CM

## 2018-12-17 DIAGNOSIS — I110 Hypertensive heart disease with heart failure: Secondary | ICD-10-CM | POA: Diagnosis not present

## 2018-12-17 DIAGNOSIS — F419 Anxiety disorder, unspecified: Secondary | ICD-10-CM

## 2018-12-17 DIAGNOSIS — Z8719 Personal history of other diseases of the digestive system: Secondary | ICD-10-CM

## 2018-12-17 DIAGNOSIS — I5021 Acute systolic (congestive) heart failure: Secondary | ICD-10-CM | POA: Diagnosis present

## 2018-12-17 DIAGNOSIS — B009 Herpesviral infection, unspecified: Secondary | ICD-10-CM | POA: Diagnosis present

## 2018-12-17 DIAGNOSIS — Z8249 Family history of ischemic heart disease and other diseases of the circulatory system: Secondary | ICD-10-CM

## 2018-12-17 DIAGNOSIS — F1410 Cocaine abuse, uncomplicated: Secondary | ICD-10-CM | POA: Diagnosis present

## 2018-12-17 DIAGNOSIS — R079 Chest pain, unspecified: Secondary | ICD-10-CM | POA: Diagnosis not present

## 2018-12-17 DIAGNOSIS — I428 Other cardiomyopathies: Secondary | ICD-10-CM | POA: Diagnosis present

## 2018-12-17 DIAGNOSIS — F1210 Cannabis abuse, uncomplicated: Secondary | ICD-10-CM | POA: Diagnosis present

## 2018-12-17 DIAGNOSIS — F410 Panic disorder [episodic paroxysmal anxiety] without agoraphobia: Secondary | ICD-10-CM | POA: Diagnosis present

## 2018-12-17 HISTORY — DX: Alcohol abuse, uncomplicated: F10.10

## 2018-12-17 HISTORY — DX: Other psychoactive substance abuse, uncomplicated: F19.10

## 2018-12-17 LAB — CBC
HCT: 44.1 % (ref 39.0–52.0)
Hemoglobin: 15.2 g/dL (ref 13.0–17.0)
MCH: 32.9 pg (ref 26.0–34.0)
MCHC: 34.5 g/dL (ref 30.0–36.0)
MCV: 95.5 fL (ref 80.0–100.0)
Platelets: 260 10*3/uL (ref 150–400)
RBC: 4.62 MIL/uL (ref 4.22–5.81)
RDW: 13.9 % (ref 11.5–15.5)
WBC: 7.6 10*3/uL (ref 4.0–10.5)
nRBC: 0 % (ref 0.0–0.2)

## 2018-12-17 LAB — URINE DRUG SCREEN, QUALITATIVE (ARMC ONLY)
Amphetamines, Ur Screen: NOT DETECTED
Barbiturates, Ur Screen: NOT DETECTED
Benzodiazepine, Ur Scrn: NOT DETECTED
Cannabinoid 50 Ng, Ur ~~LOC~~: POSITIVE — AB
Cocaine Metabolite,Ur ~~LOC~~: POSITIVE — AB
MDMA (Ecstasy)Ur Screen: NOT DETECTED
Methadone Scn, Ur: NOT DETECTED
Opiate, Ur Screen: NOT DETECTED
Phencyclidine (PCP) Ur S: NOT DETECTED
Tricyclic, Ur Screen: NOT DETECTED

## 2018-12-17 LAB — BASIC METABOLIC PANEL
Anion gap: 14 (ref 5–15)
BUN: 8 mg/dL (ref 6–20)
CO2: 17 mmol/L — ABNORMAL LOW (ref 22–32)
Calcium: 8.6 mg/dL — ABNORMAL LOW (ref 8.9–10.3)
Chloride: 105 mmol/L (ref 98–111)
Creatinine, Ser: 0.88 mg/dL (ref 0.61–1.24)
GFR calc Af Amer: >60 mL/min (ref >60)
GFR calc non Af Amer: >60 mL/min (ref >60)
Glucose, Bld: 122 mg/dL — ABNORMAL HIGH (ref 70–99)
Potassium: 3.4 mmol/L — ABNORMAL LOW (ref 3.5–5.1)
Sodium: 136 mmol/L (ref 135–145)

## 2018-12-17 LAB — LIPID PANEL
Cholesterol: 174 mg/dL (ref 0–200)
HDL: 45 mg/dL (ref >40)
LDL Cholesterol: UNDETERMINED mg/dL (ref 0–99)
Total CHOL/HDL Ratio: 3.9 RATIO
Triglycerides: 457 mg/dL — ABNORMAL HIGH (ref <150)
VLDL: UNDETERMINED mg/dL (ref 0–40)

## 2018-12-17 LAB — LDL CHOLESTEROL, DIRECT: Direct LDL: 72 mg/dL (ref 0–99)

## 2018-12-17 LAB — TROPONIN I
Troponin I: 0.04 ng/mL (ref <0.03)
Troponin I: 0.03 ng/mL (ref <0.03)
Troponin I: 0.04 ng/mL (ref <0.03)

## 2018-12-17 LAB — SARS CORONAVIRUS 2 BY RT PCR (HOSPITAL ORDER, PERFORMED IN ~~LOC~~ HOSPITAL LAB): SARS Coronavirus 2: NEGATIVE

## 2018-12-17 LAB — FIBRIN DERIVATIVES D-DIMER (ARMC ONLY): Fibrin derivatives D-dimer (ARMC): 391.43 ng/mL (FEU) (ref 0.00–499.00)

## 2018-12-17 MED ORDER — CLONAZEPAM 0.5 MG PO TABS
0.5000 mg | ORAL_TABLET | Freq: Two times a day (BID) | ORAL | Status: DC
  Administered 2018-12-17 (×2): 0.5 mg via ORAL
  Filled 2018-12-17 (×3): qty 1

## 2018-12-17 MED ORDER — HYDRALAZINE HCL 20 MG/ML IJ SOLN
10.0000 mg | Freq: Four times a day (QID) | INTRAMUSCULAR | Status: DC | PRN
  Administered 2018-12-17: 10 mg via INTRAVENOUS
  Filled 2018-12-17: qty 1
  Filled 2018-12-17: qty 0.5

## 2018-12-17 MED ORDER — NITROGLYCERIN 2 % TD OINT
1.0000 [in_us] | TOPICAL_OINTMENT | Freq: Once | TRANSDERMAL | Status: AC
  Administered 2018-12-17: 1 [in_us] via TOPICAL
  Filled 2018-12-17: qty 1

## 2018-12-17 MED ORDER — TRAZODONE HCL 50 MG PO TABS
50.0000 mg | ORAL_TABLET | Freq: Every day | ORAL | Status: DC
  Administered 2018-12-17 – 2018-12-19 (×3): 50 mg via ORAL
  Filled 2018-12-17 (×3): qty 1

## 2018-12-17 MED ORDER — GABAPENTIN 300 MG PO CAPS
300.0000 mg | ORAL_CAPSULE | Freq: Every day | ORAL | Status: DC
  Administered 2018-12-17 – 2018-12-19 (×3): 300 mg via ORAL
  Filled 2018-12-17 (×3): qty 1

## 2018-12-17 MED ORDER — OMEGA-3-ACID ETHYL ESTERS 1 G PO CAPS
1.0000 g | ORAL_CAPSULE | Freq: Every day | ORAL | Status: DC
  Administered 2018-12-17 – 2018-12-20 (×4): 1 g via ORAL
  Filled 2018-12-17 (×4): qty 1

## 2018-12-17 MED ORDER — ACETAMINOPHEN 650 MG RE SUPP: 650.0000 mg | Freq: Four times a day (QID) | RECTAL | Status: DC | PRN

## 2018-12-17 MED ORDER — NITROGLYCERIN 0.4 MG SL SUBL: 0.4000 mg | SUBLINGUAL_TABLET | SUBLINGUAL | Status: DC | PRN

## 2018-12-17 MED ORDER — ENOXAPARIN SODIUM 40 MG/0.4ML ~~LOC~~ SOLN
40.0000 mg | SUBCUTANEOUS | Status: DC
  Administered 2018-12-17 – 2018-12-19 (×3): 40 mg via SUBCUTANEOUS
  Filled 2018-12-17 (×3): qty 0.4

## 2018-12-17 MED ORDER — ASPIRIN 81 MG PO CHEW
81.0000 mg | CHEWABLE_TABLET | Freq: Every day | ORAL | Status: DC
  Administered 2018-12-18 – 2018-12-20 (×3): 81 mg via ORAL
  Filled 2018-12-17 (×3): qty 1

## 2018-12-17 MED ORDER — MELOXICAM 7.5 MG PO TABS
15.0000 mg | ORAL_TABLET | Freq: Every day | ORAL | Status: DC
  Administered 2018-12-17 – 2018-12-20 (×4): 15 mg via ORAL
  Filled 2018-12-17 (×4): qty 2

## 2018-12-17 MED ORDER — CYANOCOBALAMIN 500 MCG PO TABS
500.0000 ug | ORAL_TABLET | Freq: Every day | ORAL | Status: DC
  Administered 2018-12-17 – 2018-12-20 (×4): 500 ug via ORAL
  Filled 2018-12-17 (×4): qty 1

## 2018-12-17 MED ORDER — ONDANSETRON HCL 4 MG/2ML IJ SOLN
4.0000 mg | Freq: Four times a day (QID) | INTRAMUSCULAR | Status: DC | PRN
  Administered 2018-12-17: 4 mg via INTRAVENOUS

## 2018-12-17 MED ORDER — LORAZEPAM 2 MG/ML IJ SOLN
1.0000 mg | Freq: Once | INTRAMUSCULAR | Status: AC
  Administered 2018-12-17: 1 mg via INTRAVENOUS
  Filled 2018-12-17: qty 1

## 2018-12-17 MED ORDER — ZINC SULFATE 220 (50 ZN) MG PO CAPS
220.0000 mg | ORAL_CAPSULE | Freq: Every day | ORAL | Status: DC
  Administered 2018-12-17 – 2018-12-20 (×4): 220 mg via ORAL
  Filled 2018-12-17 (×4): qty 1

## 2018-12-17 MED ORDER — ONDANSETRON HCL 4 MG PO TABS: 4.0000 mg | ORAL_TABLET | Freq: Four times a day (QID) | ORAL | Status: DC | PRN

## 2018-12-17 MED ORDER — POTASSIUM CHLORIDE CRYS ER 20 MEQ PO TBCR
40.0000 meq | EXTENDED_RELEASE_TABLET | Freq: Once | ORAL | Status: AC
  Administered 2018-12-17: 40 meq via ORAL
  Filled 2018-12-17: qty 2

## 2018-12-17 MED ORDER — KETOROLAC TROMETHAMINE 30 MG/ML IJ SOLN
30.0000 mg | Freq: Once | INTRAMUSCULAR | Status: AC
  Administered 2018-12-17: 09:00:00 30 mg via INTRAVENOUS
  Filled 2018-12-17: qty 1

## 2018-12-17 MED ORDER — LORAZEPAM 2 MG/ML IJ SOLN
1.0000 mg | Freq: Four times a day (QID) | INTRAMUSCULAR | Status: DC | PRN
  Administered 2018-12-17: 1 mg via INTRAVENOUS
  Filled 2018-12-17: qty 1

## 2018-12-17 MED ORDER — ACETAMINOPHEN 325 MG PO TABS: 650.0000 mg | ORAL_TABLET | Freq: Four times a day (QID) | ORAL | Status: DC | PRN

## 2018-12-17 MED ORDER — IOHEXOL 350 MG/ML SOLN
75.0000 mL | Freq: Once | INTRAVENOUS | Status: AC | PRN
  Administered 2018-12-17: 75 mL via INTRAVENOUS
  Filled 2018-12-17: qty 75

## 2018-12-17 MED ORDER — ASPIRIN 81 MG PO CHEW
324.0000 mg | CHEWABLE_TABLET | Freq: Once | ORAL | Status: AC
  Administered 2018-12-17: 324 mg via ORAL
  Filled 2018-12-17: qty 4

## 2018-12-17 MED ORDER — BUSPIRONE HCL 10 MG PO TABS
10.0000 mg | ORAL_TABLET | Freq: Every evening | ORAL | Status: DC | PRN
  Filled 2018-12-17: qty 1

## 2018-12-17 NOTE — Consult Note (Signed)
Cardiology Consultation:   Patient ID: Edward [name]; [id number]; [birth year]   Admit date: 12/17/2018 Date of Consult: 12/17/2018  Primary Care Provider: Evie Lacks, NP Primary Cardiologist: New to [facility] - consult by Mariah Milling   Patient Profile:   Edward [name] is a 53 y.o. male with a hx of polysubstance abuse including ongoing cocaine, marijuana, and alcohol abuse, peripheral neuropathy with normal noninvasive vascular imaging, anxiety and panic attacks who is being seen today for the evaluation of SOB/elevated troponin at the request of Dr. Nemiah Commander, MD.  History of Present Illness:   Edward [name] has no previously known cardiac history. He has reported occasional bilateral ankle swelling that self resolves after elevating his feet at the end of the day. Over the past several months he has noted increased SOB with a rare episode of chest discomfort. His SOB has been associated with bending over to put on socks or tie his shoes. His chest discomfort has been random. Over the past couple of days, he has noted an increase in SOB when bending over to put on his socks. This morning, while putting on his socks, he again became SOB and had an episode of nausea and vomiting. He was without chest pain, diaphoresis, dizziness, presyncope, or syncope. Because of his worsening symptoms he presented to [facility].    Upon his arrival to the ED, he was noted to be hypertensive with BP in the 140s to 160s systolic with heart rates ranging from 72-105 bpm. Labs showed an initial troponin of 0.04 trending to 0.03, D dimer negative a 391.43, drug screen positive for cocaine and marijuana, COVID-19 negative, potassium 3.4, SCr 0.88, CBC unremarkable. EKG showed sinus rhythm with LVH and nonspecific inferolateral st/t changes as below. CXR not acute. CTA chest negative for PE, thoracic aortic aneurysm or dissection. My review of the images shows mild aortic and LAD calcification. In the ED he was given ASA 324  mg, Ativan, Mobic, SL NTG, Zofran, B12, Zinc, and KCl. Upon admission cardiology was asked to evaluate. He continues to note mild SOB at this time and is on supplemental oxygen. He reports rare cocaine use, daily marijuana use, and on average a 6 pack of beer most days (not Sunday).    Past Medical History:  Diagnosis Date  . Alcohol abuse   . Colon polyp 2016  . Herpes   . Polysubstance abuse (HCC)   . Vitamin B 12 deficiency     Past Surgical History:  Procedure Laterality Date  . ANAL FISTULOTOMY  04/03/2016   Procedure: ANAL FISTULOTOMY;  Surgeon: Kieth Brightly, MD;  Location: [facility] ORS;  Service: General;;  . BLADDER SURGERY    . COLONOSCOPY  07/06/2015   Dr Shelle Iron  . KNEE ARTHROSCOPY Left   . UPPER GASTROINTESTINAL ENDOSCOPY  07/06/2015     Home Meds: Prior to Admission medications   Medication Sig Start Date End Date Taking? Authorizing Provider  busPIRone (BUSPAR) 10 MG tablet Take 10 mg by mouth at bedtime as needed for anxiety. 09/17/18  Yes [provider]  clonazePAM (KLONOPIN) 0.5 MG tablet Take 0.5 mg by mouth 2 (two) times a day. 09/03/18  Yes [provider]  co-enzyme Q-10 30 MG capsule Take 30 mg by mouth daily.   Yes [provider]  cyclobenzaprine (FLEXERIL) 10 MG tablet Take 5-10 mg by mouth at bedtime as needed for muscle pain. 07/17/18  Yes [provider]  gabapentin (NEURONTIN) 300 MG capsule Take 300 mg  by mouth daily. 11/20/18  Yes [provider]  meloxicam (MOBIC) 15 MG tablet Take 15 mg by mouth daily. 12/08/18  Yes [provider]  Omega-3 Fatty Acids (FISH OIL) 1000 MG CPDR Take 1,000 mg by mouth daily.    Yes [provider]  traZODone (DESYREL) 50 MG tablet Take 50-100 mg by mouth at bedtime. 09/19/18  Yes [provider]  vitamin B-12 (CYANOCOBALAMIN) 500 MCG tablet Take 500 mcg by mouth daily.   Yes [provider]  Zinc Sulfate (ZINC-220 PO) Take 220 mg by mouth  daily.    Yes [provider]    Inpatient Medications: Scheduled Meds: . [START ON 12/18/2018] aspirin  81 mg Oral Daily  . clonazePAM  0.5 mg Oral BID  . enoxaparin (LOVENOX) injection  40 mg Subcutaneous Q24H  . gabapentin  300 mg Oral Q2200  . meloxicam  15 mg Oral Daily  . omega-3 acid ethyl esters  1 g Oral Daily  . traZODone  50-100 mg Oral QHS  . vitamin B-12  500 mcg Oral Daily  . zinc sulfate  220 mg Oral Daily   Continuous Infusions:  PRN Meds: acetaminophen **OR** acetaminophen, busPIRone, hydrALAZINE, LORazepam, nitroGLYCERIN, ondansetron **OR** ondansetron (ZOFRAN) IV  Allergies:   Allergies  Allergen Reactions  . Codeine Nausea And Vomiting    Social History:   Social History   Socioeconomic History  . Marital status: Single    Spouse name: Not on file  . Number of children: Not on file  . Years of education: Not on file  . Highest education level: Not on file  Occupational History  . Not on file  Social Needs  . Financial resource strain: Not on file  . Food insecurity:    Worry: Not on file    Inability: Not on file  . Transportation needs:    Medical: Not on file    Non-medical: Not on file  Tobacco Use  . Smoking status: Never Smoker  . Smokeless tobacco: Never Used  Substance and Sexual Activity  . Alcohol use: Yes    Comment: moderate use- does not like to quantify  . Drug use: Yes    Types: Marijuana, Cocaine  . Sexual activity: Not on file  Lifestyle  . Physical activity:    Days per week: Not on file    Minutes per session: Not on file  . Stress: Not on file  Relationships  . Social connections:    Talks on phone: Not on file    Gets together: Not on file    Attends religious service: Not on file    Active member of club or organization: Not on file    Attends meetings of clubs or organizations: Not on file    Relationship status: Not on file  . Intimate partner violence:    Fear of current or ex partner: Not on file     Emotionally abused: Not on file    Physically abused: Not on file    Forced sexual activity: Not on file  Other Topics Concern  . Not on file  Social History Narrative   Is at home by himself.  Independent     Family History:   Family History  Problem Relation Age of Onset  . Colon cancer Maternal Grandmother   . Prostate cancer Paternal Uncle   . Leukemia Paternal Uncle   . Heart failure Father   . CAD Sister     ROS:  Review of Systems  Constitutional: Positive for malaise/fatigue. Negative for chills, diaphoresis, fever and weight loss.  HENT: Negative for congestion.   Eyes: Negative for discharge and redness.  Respiratory: Positive for shortness of breath. Negative for cough, hemoptysis, sputum production and wheezing.   Cardiovascular: Positive for chest pain and orthopnea. Negative for palpitations, claudication, leg swelling and PND.  Gastrointestinal: Positive for nausea and vomiting. Negative for abdominal pain, blood in stool, heartburn and melena.  Genitourinary: Negative for hematuria.  Musculoskeletal: Negative for falls and myalgias.  Skin: Negative for rash.  Neurological: Positive for weakness. Negative for dizziness, tingling, tremors, sensory change, speech change, focal weakness and loss of consciousness.  Endo/Heme/Allergies: Does not bruise/bleed easily.  Psychiatric/Behavioral: Positive for substance abuse. The patient is nervous/anxious.   All other systems reviewed and are negative.     Physical Exam/Data:   Vitals:   12/17/18 1229 12/17/18 1303 12/17/18 1446 12/17/18 1506  BP: (!) 158/106 (!) 157/116 (!) 158/104 (!) 153/103  Pulse: 77 98 90 (!) 105  Resp: (!) 21 16    Temp:  98.2 F (36.8 C)    TempSrc:  Oral    SpO2: 97% 100% 100% 99%  Weight:      Height:       No intake or output data in the 24 hours ending 12/17/18 1554 Filed Weights   12/17/18 0912  Weight: 90 kg   Body mass index is 24.8 kg/m.   Physical Exam: General: Well  developed, well nourished, in no acute distress. Head: Normocephalic, atraumatic, sclera non-icteric, no xanthomas, nares without discharge.  Neck: Negative for carotid bruits. JVD not elevated. Lungs: Clear bilaterally to auscultation without wheezes, rales, or rhonchi. Breathing is unlabored. Heart: RRR with S1 S2. No murmurs, rubs, or gallops appreciated. Abdomen: Soft, non-tender, non-distended with normoactive bowel sounds. No hepatomegaly. No rebound/guarding. No obvious abdominal masses. Msk:  Strength and tone appear normal for age. Extremities: No clubbing or cyanosis. No edema. Distal pedal pulses are 2+ and equal bilaterally. Neuro: Alert and oriented X 3. No facial asymmetry. No focal deficit. Moves all extremities spontaneously. Psych:  Responds to questions appropriately with a normal affect.   EKG:  The EKG was personally reviewed and demonstrates: NSR, 75 bpm, LVH, nonspecific inferolateral st/t changes  Telemetry:  Telemetry was personally reviewed and demonstrates: sinus rhythm to sinus tachycardia with heart rates in the 90s to 120s bpm  Weights: Filed Weights   12/17/18 0912  Weight: 90 kg    Relevant CV Studies: None  Laboratory Data:  Chemistry Recent Labs  Lab 12/17/18 0928  NA 136  K 3.4*  CL 105  CO2 17*  GLUCOSE 122*  BUN 8  CREATININE 0.88  CALCIUM 8.6*  GFRNONAA >60  GFRAA >60  ANIONGAP 14    No results for input(s): PROT, ALBUMIN, AST, ALT, ALKPHOS, BILITOT in the last 168 hours. Hematology Recent Labs  Lab 12/17/18 0928  WBC 7.6  RBC 4.62  HGB 15.2  HCT 44.1  MCV 95.5  MCH 32.9  MCHC 34.5  RDW 13.9  PLT 260   Cardiac Enzymes Recent Labs  Lab 12/17/18 0928 12/17/18 1444  TROPONINI 0.04* 0.03*   No results for input(s): TROPIPOC in the last 168 hours.  BNPNo results for input(s): BNP, PROBNP in the last 168 hours.  DDimer No results for input(s): DDIMER in the last 168 hours.  Radiology/Studies:  Ct Angio Chest Pe W Or Wo  Contrast  Result Date: 12/17/2018 IMPRESSION: 1. No demonstrable pulmonary embolus. No thoracic  aortic aneurysm. No dissection evident; contrast bolus is not sufficient in the aorta to exclude possible dissection as a differential consideration. There are foci of aortic atherosclerosis. 2. Areas of atelectatic change bilaterally. No edema or consolidation. 3.  No evident thoracic adenopathy. 4. Reflux of contrast into the inferior vena cava and hepatic veins, a finding that may be indicative of a degree of increase in right heart pressure. Aortic Atherosclerosis (ICD10-I70.0). Electronically Signed   By: Bretta Bang III M.D.   On: 12/17/2018 11:37   Dg Chest Port 1 View  Result Date: 12/17/2018 IMPRESSION: No active disease. Electronically Signed   By: Marlan Palau M.D.   On: 12/17/2018 10:08    Assessment and Plan:   1. Elevated troponin:  -Initial troponin 0.04 with subsequent value down trending to 0.03 -Not consistent with ACS -Possibly supply demand ischemia in the setting of cocaine abuse  -Continue to cycle one more troponin  -No indication for heparin gtt at this time without dynamic elevation  -Check echo  -Further recommendations pending echo -Recommend obtaining A1c and lipid panel for further risk stratification  -ASA   2. Polysubstance abuse:  -He is positive for cocaine and marijuana abuse on drug screen this admission as well as a history of alcohol abuse  -Complete cessation is advised  -Avoid beta blocker with ongoing cocaine abuse   3. HTN:  -Likely exacerbated in the setting of ongoing cocaine and alcohol abuse  -Recommend cessation as above  -Add antihypertensive if BP continues to be elevated  -PRN hydralazine for  4. Anxiety with panic attacks: -Per IM   For questions or updates, please contact [facility] Please consult [email or web address] for contact info under Cardiology/STEMI.   Signed, Eula Listen, PA-C [facility] Pager: [phone]  12/17/2018, 3:54 PM

## 2018-12-17 NOTE — Progress Notes (Signed)
Patient had another episode of chest pain with dyspnea- EKG ordered Ativan given for anxiety. Cardiology consulted- [facility]

## 2018-12-17 NOTE — ED Provider Notes (Signed)
[facility] Emergency Department Provider Note       Time seen: ----------------------------------------- 9:16 AM on 12/17/2018 -----------------------------------------   I have reviewed the triage vital signs and the nursing notes.  HISTORY   Chief Complaint Shortness of Breath   HPI TINA WLODARCZYK is a 53 y.o. male with no significant past medical history who presents to the ED for worsening shortness of breath over the past several mornings.  Patient states he gets short of breath when he tries to put on his socks.  States he wakes up at 3 AM and cannot go back to sleep.  He reports he has been having panic attacks, he smokes marijuana.  Denies any pain currently.  Past Medical History:  Diagnosis Date  . Colon polyp 2016  . Herpes   . Vitamin B 12 deficiency     Patient Active Problem List   Diagnosis Date Noted  . Leg pain 08/07/2018  . Neuropathy 06/23/2018  . Varicose veins of both lower extremities 06/23/2018  . Bilateral cold feet 06/23/2018    Past Surgical History:  Procedure Laterality Date  . ANAL FISTULOTOMY  04/03/2016   Procedure: ANAL FISTULOTOMY;  Surgeon: Kieth Brightly, MD;  Location: [facility] ORS;  Service: General;;  . BLADDER SURGERY    . COLONOSCOPY  07/06/2015   Dr Shelle Iron  . KNEE ARTHROSCOPY Left   . UPPER GASTROINTESTINAL ENDOSCOPY  07/06/2015    Allergies Codeine  Social History Social History   Tobacco Use  . Smoking status: Never Smoker  . Smokeless tobacco: Never Used  Substance Use Topics  . Alcohol use: Yes    Comment: Occasionally   . Drug use: Yes    Types: Marijuana   Review of Systems Constitutional: Negative for fever. Cardiovascular: Negative for chest pain. Respiratory: Positive for shortness of breath Gastrointestinal: Negative for abdominal pain, vomiting and diarrhea. Musculoskeletal: Negative for back pain. Skin: Negative for rash. Neurological: Negative for headaches, focal weakness  or numbness. Psychiatric: Positive for anxiety  All systems negative/normal/unremarkable except as stated in the HPI  ____________________________________________   PHYSICAL EXAM:  VITAL SIGNS: ED Triage Vitals  Enc Vitals Group     BP 12/17/18 0914 (!) 164/100     Pulse Rate 12/17/18 0914 79     Resp --      Temp 12/17/18 0914 98.8 F (37.1 C)     Temp Source 12/17/18 0914 Oral     SpO2 12/17/18 0914 99 %     Weight 12/17/18 0912 198 lb 6.6 oz (90 kg)     Height 12/17/18 0912 6\' 3"  (1.905 m)     Head Circumference --      Peak Flow --      Pain Score 12/17/18 0912 0     Pain Loc --      Pain Edu? --      Excl. in GC? --    Constitutional: Alert and oriented. Well appearing and in no distress. Eyes: Conjunctivae are normal. Normal extraocular movements. ENT      Head: Normocephalic and atraumatic.      Nose: No congestion/rhinnorhea.      Mouth/Throat: Mucous membranes are moist.      Neck: No stridor. Cardiovascular: Normal rate, regular rhythm. No murmurs, rubs, or gallops. Respiratory: Normal respiratory effort without tachypnea nor retractions. Breath sounds are clear and equal bilaterally. No wheezes/rales/rhonchi. Gastrointestinal: Soft and nontender. Normal bowel sounds Musculoskeletal: Nontender with normal range of motion in extremities. No lower extremity  tenderness nor edema. Neurologic:  Normal speech and language. No gross focal neurologic deficits are appreciated.  Skin:  Skin is warm, dry and intact. No rash noted. Psychiatric: Mood and affect are normal. Speech and behavior are normal.  ____________________________________________  EKG: Interpreted by me.  Sinus rhythm with rate of 75 bpm, normal PR interval, LVH with repolarization abnormality, normal QT  ____________________________________________  ED COURSE:  As part of my medical decision making, I reviewed the following data within the electronic MEDICAL RECORD NUMBER History obtained from family if  available, nursing notes, old chart and ekg, as well as notes from prior ED visits. Patient presented for shortness of breath, we will assess with labs and imaging as indicated at this time.   Procedures  Deaglan Reiser Mas was evaluated in Emergency Department on 12/17/2018 for the symptoms described in the history of present illness. He was evaluated in the context of the global COVID-19 pandemic, which necessitated consideration that the patient might be at risk for infection with the SARS-CoV-2 virus that causes COVID-19. Institutional protocols and algorithms that pertain to the evaluation of patients at risk for COVID-19 are in a state of rapid change based on information released by regulatory bodies including the CDC and federal and state organizations. These policies and algorithms were followed during the patient's care in the ED.  ____________________________________________   LABS (pertinent positives/negatives)  Labs Reviewed  BASIC METABOLIC PANEL - Abnormal; Notable for the following components:      Result Value   Potassium 3.4 (*)    CO2 17 (*)    Glucose, Bld 122 (*)    Calcium 8.6 (*)    All other components within normal limits  TROPONIN I - Abnormal; Notable for the following components:   Troponin I 0.04 (*)    All other components within normal limits  SARS CORONAVIRUS 2 (HOSPITAL ORDER, PERFORMED IN [facility])  CBC    RADIOLOGY Images were viewed by me  Chest x-ray IMPRESSION: No active disease. ____________________________________________   DIFFERENTIAL DIAGNOSIS   Panic attack, anxiety, pneumonia, coronavirus, unstable angina  FINAL ASSESSMENT AND PLAN  Shortness of breath, elevated troponin   Plan: The patient had presented for shortness of breath which is likely anxiety. Patient's labs do reveal a troponin elevation of 0.04. Patient's imaging is negative for any acute process.  His EKG is abnormal.  This in conjunction with his elevated  troponin is concerning for an STEMI.  Patient was given aspirin and a nitroglycerin paste.  I will discuss with the hospitalist for admission.   Ulice Dash, MD    Note: This note was generated in part or whole with voice recognition software. Voice recognition is usually quite accurate but there are transcription errors that can and very often do occur. I apologize for any typographical errors that were not detected and corrected.     Emily Filbert, MD 12/17/18 1049

## 2018-12-17 NOTE — ED Notes (Signed)
ED TO INPATIENT HANDOFF REPORT  ED Nurse Name and Phone #: Shanda Bumpsjessica [phone]  S Name/Age/Gender Edward [name] 53 y.o. male Room/Bed: ED32A/ED32A  Code Status   Code Status: Not on file  Home/SNF/Other Home Patient oriented to: self, place, time and situation Is this baseline? Yes   Triage Complete: Triage complete  Chief Complaint chest pain  Triage Note Pt via POV for increasing SOB for the last couple of mornings. Pt says that after he wakes up at about 3am he can;t go back to sleep and can't put his socks without feeling SOB. Pt also states that he has been having panic attacks. Pt smokes weed not cigarettes.    Allergies Allergies  Allergen Reactions  . Codeine Nausea And Vomiting    Level of Care/Admitting Diagnosis ED Disposition    ED Disposition Condition Comment   Admit  [facility] [[id number]]  Level of Care: Telemetry [5]  Covid Evaluation: N/A  Diagnosis: Chest pain [[id number]][[id number]]  Admitting Physician: Enid BaasKALISETTI, RADHIKA [[id number]][[id number]]  Attending Physician: Enid BaasKALISETTI, RADHIKA [phone][[id number]]  PT Class (Do Not Modify): Observation [104]  PT Acc Code (Do Not Modify): Observation [10022]       B Medical/Surgery History Past Medical History:  Diagnosis Date  . Colon polyp 2016  . Herpes   . Vitamin B 12 deficiency    Past Surgical History:  Procedure Laterality Date  . ANAL FISTULOTOMY  04/03/2016   Procedure: ANAL FISTULOTOMY;  Surgeon: Kieth BrightlySeeplaputhur G Sankar, MD;  Location: [facility] ORS;  Service: General;;  . BLADDER SURGERY    . COLONOSCOPY  07/06/2015   Dr Shelle Ironein  . KNEE ARTHROSCOPY Left   . UPPER GASTROINTESTINAL ENDOSCOPY  07/06/2015     A IV Location/Drains/Wounds Patient Lines/Drains/Airways Status   Active Line/Drains/Airways    Name:   Placement date:   Placement time:   Site:   Days:   Peripheral IV 04/03/16 Right Hand   04/03/16    1045    Hand   988   Peripheral IV 12/17/18 Right Antecubital   12/17/18    0922     Antecubital   less than 1   Incision (Closed) 04/03/16 Perineum   04/03/16    1158     988          Intake/Output Last 24 hours No intake or output data in the 24 hours ending 12/17/18 1209  Labs/Imaging Results for orders placed or performed during the hospital encounter of 12/17/18 (from the past 48 hour(s))  Basic metabolic panel     Status: Abnormal   Collection Time: 12/17/18  9:28 AM  Result Value Ref Range   Sodium 136 135 - 145 mmol/L   Potassium 3.4 (L) 3.5 - 5.1 mmol/L   Chloride 105 98 - 111 mmol/L   CO2 17 (L) 22 - 32 mmol/L   Glucose, Bld 122 (H) 70 - 99 mg/dL   BUN 8 6 - 20 mg/dL   Creatinine, Ser 0.860.88 0.61 - 1.24 mg/dL   Calcium 8.6 (L) 8.9 - 10.3 mg/dL   GFR calc non Af Amer >60 >60 mL/min   GFR calc Af Amer >60 >60 mL/min   Anion gap 14 5 - 15    Comment: Performed at [facility], [address] [place], [place] [address]  CBC     Status: None   Collection Time: 12/17/18  9:28 AM  Result Value Ref Range   WBC 7.6 4.0 - 10.5 K/uL   RBC 4.62  4.22 - 5.81 MIL/uL   Hemoglobin 15.2 13.0 - 17.0 g/dL   HCT 63.8 75.6 - 43.3 %   MCV 95.5 80.0 - 100.0 fL   MCH 32.9 26.0 - 34.0 pg   MCHC 34.5 30.0 - 36.0 g/dL   RDW 29.5 18.8 - 41.6 %   Platelets 260 150 - 400 K/uL   nRBC 0.0 0.0 - 0.2 %    Comment: Performed at [facility], [address] [place], [place] [address]  Troponin I - Once     Status: Abnormal   Collection Time: 12/17/18  9:28 AM  Result Value Ref Range   Troponin I 0.04 (HH) <0.03 ng/mL    Comment: CRITICAL RESULT CALLED TO, READ BACK BY AND VERIFIED WITH JESSICA [name]  AT 1000 ON 12/17/18 KLM Performed at [facility], [address], [place], [place] [address]   SARS Coronavirus 2 (CEPHEID - Performed in [facility] hospital lab), Hosp Order     Status: None   Collection Time: 12/17/18  9:34 AM  Result Value Ref Range   SARS Coronavirus 2 NEGATIVE NEGATIVE    Comment: (NOTE) If result is NEGATIVE SARS-CoV-2  target nucleic acids are NOT DETECTED. The SARS-CoV-2 RNA is generally detectable in upper and lower  respiratory specimens during the acute phase of infection. The lowest  concentration of SARS-CoV-2 viral copies this assay can detect is 250  copies / mL. A negative result does not preclude SARS-CoV-2 infection  and should not be used as the sole basis for treatment or other  patient management decisions.  A negative result may occur with  improper specimen collection / handling, submission of specimen other  than nasopharyngeal swab, presence of viral mutation(s) within the  areas targeted by this assay, and inadequate number of viral copies  (<250 copies / mL). A negative result must be combined with clinical  observations, patient history, and epidemiological information. If result is POSITIVE SARS-CoV-2 target nucleic acids are DETECTED. The SARS-CoV-2 RNA is generally detectable in upper and lower  respiratory specimens dur ing the acute phase of infection.  Positive  results are indicative of active infection with SARS-CoV-2.  Clinical  correlation with patient history and other diagnostic information is  necessary to determine patient infection status.  Positive results do  not rule out bacterial infection or co-infection with other viruses. If result is PRESUMPTIVE POSTIVE SARS-CoV-2 nucleic acids MAY BE PRESENT.   A presumptive positive result was obtained on the submitted specimen  and confirmed on repeat testing.  While 2019 novel coronavirus  (SARS-CoV-2) nucleic acids may be present in the submitted sample  additional confirmatory testing may be necessary for epidemiological  and / or clinical management purposes  to differentiate between  SARS-CoV-2 and other Sarbecovirus currently known to infect humans.  If clinically indicated additional testing with an alternate test  methodology [phone]) is advised. The SARS-CoV-2 RNA is generally  detectable in upper and lower  respiratory sp ecimens during the acute  phase of infection. The expected result is Negative. Fact Sheet for Patients:  BoilerBrush.com.cy Fact Sheet for Healthcare Providers: https://pope.com/ This test is not yet approved or cleared by the [place] FDA and has been authorized for detection and/or diagnosis of SARS-CoV-2 by FDA under an Emergency Use Authorization (EUA).  This EUA will remain in effect (meaning this test can be used) for the duration of the COVID-19 declaration under Section 564(b)(1) of the Act, 21 U.S.C. section 360bbb-3(b)(1), unless the authorization is terminated or  revoked sooner. Performed at [facility], [address] [place], [place] [address]   Urine Drug Screen, Qualitative [facility] only)     Status: Abnormal   Collection Time: 12/17/18 11:05 AM  Result Value Ref Range   Tricyclic, Ur Screen NONE DETECTED NONE DETECTED   Amphetamines, Ur Screen NONE DETECTED NONE DETECTED   MDMA (Ecstasy)Ur Screen NONE DETECTED NONE DETECTED   Cocaine Metabolite,Ur [place] POSITIVE (A) NONE DETECTED   Opiate, Ur Screen NONE DETECTED NONE DETECTED   Phencyclidine (PCP) Ur S NONE DETECTED NONE DETECTED   Cannabinoid 50 Ng, Ur [place] POSITIVE (A) NONE DETECTED   Barbiturates, Ur Screen NONE DETECTED NONE DETECTED   Benzodiazepine, Ur Scrn NONE DETECTED NONE DETECTED   Methadone Scn, Ur NONE DETECTED NONE DETECTED    Comment: (NOTE) Tricyclics + metabolites, urine    Cutoff 1000 ng/mL Amphetamines + metabolites, urine  Cutoff 1000 ng/mL MDMA (Ecstasy), urine              Cutoff 500 ng/mL Cocaine Metabolite, urine          Cutoff 300 ng/mL Opiate + metabolites, urine        Cutoff 300 ng/mL Phencyclidine (PCP), urine         Cutoff 25 ng/mL Cannabinoid, urine                 Cutoff 50 ng/mL Barbiturates + metabolites, urine  Cutoff 200 ng/mL Benzodiazepine, urine              Cutoff 200 ng/mL Methadone, urine                    Cutoff 300 ng/mL The urine drug screen provides only a preliminary, unconfirmed analytical test result and should not be used for non-medical purposes. Clinical consideration and professional judgment should be applied to any positive drug screen result due to possible interfering substances. A more specific alternate chemical method must be used in order to obtain a confirmed analytical result. Gas chromatography / mass spectrometry (GC/MS) is the preferred confirmat ory method. Performed at [facility], [address] [place], [place] [address]    Ct Angio Chest Pe W Or Wo Contrast  Result Date: 12/17/2018 CLINICAL DATA:  Chest pain and shortness of breath EXAM: CT ANGIOGRAPHY CHEST WITH CONTRAST TECHNIQUE: Multidetector CT imaging of the chest was performed using the standard protocol during bolus administration of intravenous contrast. Multiplanar CT image reconstructions and MIPs were obtained to evaluate the vascular anatomy. CONTRAST:  75mL OMNIPAQUE IOHEXOL 350 MG/ML SOLN COMPARISON:  Chest radiograph Dec 17, 2018 FINDINGS: Cardiovascular: There is no demonstrable pulmonary embolus. There is no thoracic aortic aneurysm. No dissection is evident. Note that the contrast bolus in the aorta is not sufficient to assess for potential dissection. There is aortic atherosclerosis. Visualized great vessels appear unremarkable. There is no pericardial effusion or pericardial thickening. Mediastinum/Nodes: Thyroid appears unremarkable. There is no appreciable thoracic adenopathy. No esophageal lesions are evident. Lungs/Pleura: There is no edema or consolidation. There are areas of mild patchy atelectasis bilaterally. There is no appreciable pleural effusion or pleural thickening. Upper Abdomen: There is reflux of contrast into the inferior vena cava and hepatic veins. Visualized upper abdominal structures otherwise appear unremarkable. Musculoskeletal: No blastic or lytic bone  lesions. No chest wall lesions evident. Review of the MIP images confirms the above findings. IMPRESSION: 1. No demonstrable pulmonary embolus. No thoracic aortic aneurysm. No dissection evident; contrast bolus is not sufficient in the  aorta to exclude possible dissection as a differential consideration. There are foci of aortic atherosclerosis. 2. Areas of atelectatic change bilaterally. No edema or consolidation. 3.  No evident thoracic adenopathy. 4. Reflux of contrast into the inferior vena cava and hepatic veins, a finding that may be indicative of a degree of increase in right heart pressure. Aortic Atherosclerosis (ICD10-I70.0). Electronically Signed   By: Bretta Bang III M.D.   On: 12/17/2018 11:37   Dg Chest Port 1 View  Result Date: 12/17/2018 CLINICAL DATA:  Chest pain short of breath EXAM: PORTABLE CHEST 1 VIEW COMPARISON:  12/21/2005 FINDINGS: The heart size and mediastinal contours are within normal limits. Both lungs are clear. The visualized skeletal structures are unremarkable. IMPRESSION: No active disease. Electronically Signed   By: Marlan Palau M.D.   On: 12/17/2018 10:08    Pending Labs Unresulted Labs (From admission, onward)    Start     Ordered   12/17/18 1054  Fibrin derivatives D-Dimer ([facility] only)  Once,   STAT     12/17/18 1053   Signed and Held  HIV antibody (Routine Testing)  Once,   R     Signed and Held   Signed and Held  Basic metabolic panel  Tomorrow morning,   R     Signed and Held   Signed and Held  CBC  Tomorrow morning,   R     Signed and Held   Signed and Held  Troponin I - Now Then Q6H  Now then every 6 hours,   R     Signed and Held          Vitals/Pain Today's Vitals   12/17/18 0912 12/17/18 0914 12/17/18 0916 12/17/18 1020  BP:  (!) 164/100  (!) 148/97  Pulse:  79  72  Resp:   16 (!) 21  Temp:  98.8 F (37.1 C)    TempSrc:  Oral    SpO2:  99%  97%  Weight: 90 kg     Height: 6\' 3"  (1.905 m)     PainSc: 0-No pain        Isolation Precautions No active isolations  Medications Medications  hydrALAZINE (APRESOLINE) injection 10 mg (has no administration in time range)  ketorolac (TORADOL) 30 MG/ML injection 30 mg (30 mg Intravenous Given 12/17/18 0923)  LORazepam (ATIVAN) injection 1 mg (1 mg Intravenous Given 12/17/18 2574)  aspirin chewable tablet 324 mg (324 mg Oral Given 12/17/18 1022)  nitroGLYCERIN (NITROGLYN) 2 % ointment 1 inch (1 inch Topical Given 12/17/18 1022)  iohexol (OMNIPAQUE) 350 MG/ML injection 75 mL (75 mLs Intravenous Contrast Given 12/17/18 1103)    Mobility walks Low fall risk   Focused Assessments Pulmonary Assessment Handoff:  Lung sounds:   O2 Device: Room Air        R Recommendations: See Admitting Provider Note  Report given to:   Additional Notes:

## 2018-12-17 NOTE — Progress Notes (Signed)
Patient resting in the bed at this time no distress noted .

## 2018-12-17 NOTE — ED Notes (Signed)
Patient transported to CT 

## 2018-12-17 NOTE — Progress Notes (Signed)
Patient appear to having panic attack, very anxious,  with episode of vomiting, DR Nemiah Commander notified, new order received, EKG stat, 1 mg ativan iv administer , will continue to monitor

## 2018-12-17 NOTE — ED Notes (Signed)
Admitting at bedside 

## 2018-12-17 NOTE — H&P (Signed)
[employer] -  at [facility]   PATIENT NAME: Edward [name]    MR#:  [id number]  DATE OF BIRTH:  11/26/1965  DATE OF ADMISSION:  12/17/2018  PRIMARY CARE PHYSICIAN: Evie Lacks, NP   REQUESTING/REFERRING PHYSICIAN: Dr. Daryel November  CHIEF COMPLAINT:   Chief Complaint  Patient presents with  . Shortness of Breath    HISTORY OF PRESENT ILLNESS:  Edward [name]  is a 53 y.o. male with a known history of polysubstance abuse including alcohol and marijuana, occasional cocaine use presents to hospital secondary to worsening shortness of breath for the last 2 days. Patient is a poor historian.  He has occasional swelling at his ankles at the end of the day which improves by itself.  For the last couple of days he has noted that he is getting extremely short of breath even by bending to tie his shoelaces and so presented to the emergency room.  He has an occasional dry cough which is not new.  Denies any fevers but had chills yesterday.  No nausea or vomiting.  No exposure to sick contacts or recent travel.  Chest x-ray here is negative and CT angios negative for PE in the emergency room.  His first troponin is elevated at 0.04 and EKG indicating LVH.  He is being admitted for the same.  PAST MEDICAL HISTORY:   Past Medical History:  Diagnosis Date  . Alcohol abuse   . Colon polyp 2016  . Herpes   . Polysubstance abuse (HCC)   . Vitamin B 12 deficiency     PAST SURGICAL HISTORY:   Past Surgical History:  Procedure Laterality Date  . ANAL FISTULOTOMY  04/03/2016   Procedure: ANAL FISTULOTOMY;  Surgeon: Kieth Brightly, MD;  Location: [facility] ORS;  Service: General;;  . BLADDER SURGERY    . COLONOSCOPY  07/06/2015   Dr Shelle Iron  . KNEE ARTHROSCOPY Left   . UPPER GASTROINTESTINAL ENDOSCOPY  07/06/2015    SOCIAL HISTORY:   Social History   Tobacco Use  . Smoking status: Never Smoker  . Smokeless tobacco: Never Used  Substance Use Topics  .  Alcohol use: Yes    Comment: moderate use- does not like to quantify    FAMILY HISTORY:   Family History  Problem Relation Age of Onset  . Colon cancer Maternal Grandmother   . Prostate cancer Paternal Uncle   . Leukemia Paternal Uncle   . Heart failure Father   . CAD Sister     DRUG ALLERGIES:   Allergies  Allergen Reactions  . Codeine Nausea And Vomiting    REVIEW OF SYSTEMS:   Review of Systems  Constitutional: Negative for chills, fever, malaise/fatigue and weight loss.  HENT: Negative for ear discharge, ear pain, hearing loss and nosebleeds.   Eyes: Negative for blurred vision, double vision and photophobia.  Respiratory: Positive for shortness of breath. Negative for cough, hemoptysis and wheezing.   Cardiovascular: Positive for chest pain. Negative for palpitations, orthopnea and leg swelling.  Gastrointestinal: Negative for abdominal pain, constipation, diarrhea, heartburn, melena, nausea and vomiting.  Genitourinary: Negative for dysuria, frequency, hematuria and urgency.  Musculoskeletal: Negative for back pain, myalgias and neck pain.  Skin: Negative for rash.  Neurological: Negative for dizziness, tingling, tremors, sensory change, speech change, focal weakness and headaches.  Endo/Heme/Allergies: Does not bruise/bleed easily.  Psychiatric/Behavioral: Negative for depression.    MEDICATIONS AT HOME:   Prior to Admission medications   Medication Sig Start Date End Date  Taking? Authorizing Provider  busPIRone (BUSPAR) 10 MG tablet Take 10 mg by mouth at bedtime as needed for anxiety. 09/17/18  Yes [provider]  clonazePAM (KLONOPIN) 0.5 MG tablet Take 0.5 mg by mouth 2 (two) times a day. 09/03/18  Yes [provider]  co-enzyme Q-10 30 MG capsule Take 30 mg by mouth daily.   Yes [provider]  cyclobenzaprine (FLEXERIL) 10 MG tablet Take 5-10 mg by mouth at bedtime as needed for muscle pain. 07/17/18  Yes [provider]   gabapentin (NEURONTIN) 300 MG capsule Take 300 mg by mouth daily. 11/20/18  Yes [provider]  meloxicam (MOBIC) 15 MG tablet Take 15 mg by mouth daily. 12/08/18  Yes [provider]  Omega-3 Fatty Acids (FISH OIL) 1000 MG CPDR Take 1,000 mg by mouth daily.    Yes [provider]  traZODone (DESYREL) 50 MG tablet Take 50-100 mg by mouth at bedtime. 09/19/18  Yes [provider]  vitamin B-12 (CYANOCOBALAMIN) 500 MCG tablet Take 500 mcg by mouth daily.   Yes [provider]  Zinc Sulfate (ZINC-220 PO) Take 220 mg by mouth daily.    Yes [provider]      VITAL SIGNS:  Blood pressure (!) 157/116, pulse 98, temperature 98.2 F (36.8 C), temperature source Oral, resp. rate 16, height 6\' 3"  (1.905 m), weight 90 kg, SpO2 100 %.  PHYSICAL EXAMINATION:  Physical Exam  GENERAL:  53 y.o.-year-old patient lying in the bed with no acute distress.  EYES: Pupils equal, round, reactive to light and accommodation. No scleral icterus. Extraocular muscles intact.  HEENT: Head atraumatic, normocephalic. Oropharynx and nasopharynx clear.  NECK:  Supple, no jugular venous distention. No thyroid enlargement, no tenderness.  LUNGS: Normal breath sounds bilaterally, no wheezing, rales,rhonchi or crepitation. No use of accessory muscles of respiration.  CARDIOVASCULAR: S1, S2 normal. No murmurs, rubs, or gallops.  No chest wall tenderness ABDOMEN: Soft, nontender, nondistended. Bowel sounds present. No organomegaly or mass.  EXTREMITIES: No pedal edema, cyanosis, or clubbing.  NEUROLOGIC: Cranial nerves II through XII are intact. Muscle strength 5/5 in all extremities. Sensation intact. Gait not checked.  PSYCHIATRIC: The patient is alert and oriented x 3.  SKIN: No obvious rash, lesion, or ulcer.   LABORATORY PANEL:   CBC Recent Labs  Lab 12/17/18 0928  WBC 7.6  HGB 15.2  HCT 44.1  PLT 260    ------------------------------------------------------------------------------------------------------------------  Chemistries  Recent Labs  Lab 12/17/18 0928  NA 136  K 3.4*  CL 105  CO2 17*  GLUCOSE 122*  BUN 8  CREATININE 0.88  CALCIUM 8.6*   ------------------------------------------------------------------------------------------------------------------  Cardiac Enzymes Recent Labs  Lab 12/17/18 0928  TROPONINI 0.04*   ------------------------------------------------------------------------------------------------------------------  RADIOLOGY:  Ct Angio Chest Pe W Or Wo Contrast  Result Date: 12/17/2018 CLINICAL DATA:  Chest pain and shortness of breath EXAM: CT ANGIOGRAPHY CHEST WITH CONTRAST TECHNIQUE: Multidetector CT imaging of the chest was performed using the standard protocol during bolus administration of intravenous contrast. Multiplanar CT image reconstructions and MIPs were obtained to evaluate the vascular anatomy. CONTRAST:  38mL OMNIPAQUE IOHEXOL 350 MG/ML SOLN COMPARISON:  Chest radiograph Dec 17, 2018 FINDINGS: Cardiovascular: There is no demonstrable pulmonary embolus. There is no thoracic aortic aneurysm. No dissection is evident. Note that the contrast bolus in the aorta is not sufficient to assess for potential dissection. There is aortic atherosclerosis. Visualized great vessels appear unremarkable. There is no pericardial effusion or pericardial thickening. Mediastinum/Nodes: Thyroid appears  unremarkable. There is no appreciable thoracic adenopathy. No esophageal lesions are evident. Lungs/Pleura: There is no edema or consolidation. There are areas of mild patchy atelectasis bilaterally. There is no appreciable pleural effusion or pleural thickening. Upper Abdomen: There is reflux of contrast into the inferior vena cava and hepatic veins. Visualized upper abdominal structures otherwise appear unremarkable. Musculoskeletal: No blastic or lytic bone lesions. No  chest wall lesions evident. Review of the MIP images confirms the above findings. IMPRESSION: 1. No demonstrable pulmonary embolus. No thoracic aortic aneurysm. No dissection evident; contrast bolus is not sufficient in the aorta to exclude possible dissection as a differential consideration. There are foci of aortic atherosclerosis. 2. Areas of atelectatic change bilaterally. No edema or consolidation. 3.  No evident thoracic adenopathy. 4. Reflux of contrast into the inferior vena cava and hepatic veins, a finding that may be indicative of a degree of increase in right heart pressure. Aortic Atherosclerosis (ICD10-I70.0). Electronically Signed   By: Bretta BangWilliam  Woodruff III M.D.   On: 12/17/2018 11:37   Dg Chest Port 1 View  Result Date: 12/17/2018 CLINICAL DATA:  Chest pain short of breath EXAM: PORTABLE CHEST 1 VIEW COMPARISON:  12/21/2005 FINDINGS: The heart size and mediastinal contours are within normal limits. Both lungs are clear. The visualized skeletal structures are unremarkable. IMPRESSION: No active disease. Electronically Signed   By: Marlan Palauharles  Clark M.D.   On: 12/17/2018 10:08      IMPRESSION AND PLAN:   Torrie MayersRonald Goldring  is a 53 y.o. male with a known history of polysubstance abuse including alcohol and marijuana, occasional cocaine use presents to hospital secondary to worsening shortness of breath for the last 2 days.  1.  Dyspnea and elevated troponin-concern for cardiomyopathy. -No active chest pain.  Admit under observation.  Recycle troponins.  If increasing-we will add heparin drip and consult cardiology. -Patient appears comfortable at this time. -Echocardiogram.  Started on aspirin.  Check lipid panel  2.  Hypertension-not sure if this is from cocaine.  Continue hydralazine PRN for now.  No known history of hypertension  3.  Anxiety-on Klonopin and BuSpar  4.  Neuropathy-on gabapentin and meloxicam  5.  DVT prophylaxis-Lovenox  6.  Alcohol use-denies quantification of his  alcohol use.  Confirms that he does not go into withdrawal in the hospital.  Closely monitor.    All the records are reviewed and case discussed with ED provider. Management plans discussed with the patient, family and they are in agreement.  CODE STATUS: Full Code  TOTAL TIME TAKING CARE OF THIS PATIENT: 51 minutes.    Enid Baasadhika Gali Spinney M.D on 12/17/2018 at 1:19 PM  Between 7am to 6pm - Pager - [phone]  After 6pm go to [email or web address] - Social research officer, governmentpassword [email or web address] [facility]  [facility] [place] [facility]  Office  [phone]  CC: Primary care physician; Evie LacksPointer, Elizabeth, NP   Note: This dictation was prepared with Dragon dictation along with smaller phrase technology. Any transcriptional errors that result from this process are unintentional.

## 2018-12-17 NOTE — ED Triage Notes (Signed)
Pt via POV for increasing SOB for the last couple of mornings. Pt says that after he wakes up at about 3am he can;t go back to sleep and can't put his socks without feeling SOB. Pt also states that he has been having panic attacks. Pt smokes weed not cigarettes.

## 2018-12-18 ENCOUNTER — Observation Stay (HOSPITAL_BASED_OUTPATIENT_CLINIC_OR_DEPARTMENT_OTHER)
Admit: 2018-12-18 | Discharge: 2018-12-18 | Disposition: A | Discharge status: Home / Self Care | Payer: No Typology Code available for payment source | Attending: Internal Medicine | Admitting: Internal Medicine

## 2018-12-18 ENCOUNTER — Other Ambulatory Visit: Payer: No Typology Code available for payment source

## 2018-12-18 DIAGNOSIS — F1410 Cocaine abuse, uncomplicated: Secondary | ICD-10-CM | POA: Diagnosis not present

## 2018-12-18 DIAGNOSIS — I340 Nonrheumatic mitral (valve) insufficiency: Secondary | ICD-10-CM

## 2018-12-18 DIAGNOSIS — F17200 Nicotine dependence, unspecified, uncomplicated: Secondary | ICD-10-CM

## 2018-12-18 DIAGNOSIS — R0600 Dyspnea, unspecified: Secondary | ICD-10-CM | POA: Diagnosis not present

## 2018-12-18 DIAGNOSIS — F1910 Other psychoactive substance abuse, uncomplicated: Secondary | ICD-10-CM

## 2018-12-18 DIAGNOSIS — I420 Dilated cardiomyopathy: Secondary | ICD-10-CM | POA: Diagnosis not present

## 2018-12-18 LAB — BASIC METABOLIC PANEL
Anion gap: 10 (ref 5–15)
BUN: 9 mg/dL (ref 6–20)
CO2: 22 mmol/L (ref 22–32)
Calcium: 8.3 mg/dL — ABNORMAL LOW (ref 8.9–10.3)
Chloride: 106 mmol/L (ref 98–111)
Creatinine, Ser: 1.08 mg/dL (ref 0.61–1.24)
GFR calc Af Amer: >60 mL/min (ref >60)
GFR calc non Af Amer: >60 mL/min (ref >60)
Glucose, Bld: 103 mg/dL — ABNORMAL HIGH (ref 70–99)
Potassium: 3.5 mmol/L (ref 3.5–5.1)
Sodium: 138 mmol/L (ref 135–145)

## 2018-12-18 LAB — ECHOCARDIOGRAM COMPLETE
Height: 75 in
Weight: 3078.4 oz

## 2018-12-18 LAB — HIV ANTIBODY (ROUTINE TESTING W REFLEX): HIV Screen 4th Generation wRfx: NONREACTIVE

## 2018-12-18 LAB — CBC
HCT: 41.8 % (ref 39.0–52.0)
Hemoglobin: 14.4 g/dL (ref 13.0–17.0)
MCH: 33.1 pg (ref 26.0–34.0)
MCHC: 34.4 g/dL (ref 30.0–36.0)
MCV: 96.1 fL (ref 80.0–100.0)
Platelets: 229 10*3/uL (ref 150–400)
RBC: 4.35 MIL/uL (ref 4.22–5.81)
RDW: 14 % (ref 11.5–15.5)
WBC: 7 10*3/uL (ref 4.0–10.5)
nRBC: 0 % (ref 0.0–0.2)

## 2018-12-18 LAB — HEMOGLOBIN A1C
Hgb A1c MFr Bld: 5.6 % (ref 4.8–5.6)
Mean Plasma Glucose: 114.02 mg/dL

## 2018-12-18 LAB — TROPONIN I: Troponin I: 0.03 ng/mL (ref <0.03)

## 2018-12-18 MED ORDER — CITALOPRAM HYDROBROMIDE 20 MG PO TABS
20.0000 mg | ORAL_TABLET | Freq: Every day | ORAL | Status: DC
  Administered 2018-12-18 – 2018-12-20 (×3): 20 mg via ORAL
  Filled 2018-12-18 (×3): qty 1

## 2018-12-18 MED ORDER — SODIUM CHLORIDE 0.9% FLUSH
3.0000 mL | Freq: Two times a day (BID) | INTRAVENOUS | Status: DC
  Administered 2018-12-18: 3 mL via INTRAVENOUS

## 2018-12-18 MED ORDER — SODIUM CHLORIDE 0.9 % IV SOLN: 250.0000 mL | INTRAVENOUS | Status: DC | PRN

## 2018-12-18 MED ORDER — BUSPIRONE HCL 10 MG PO TABS
10.0000 mg | ORAL_TABLET | Freq: Two times a day (BID) | ORAL | Status: DC
  Administered 2018-12-18 – 2018-12-20 (×5): 10 mg via ORAL
  Filled 2018-12-18 (×6): qty 1

## 2018-12-18 MED ORDER — SODIUM CHLORIDE 0.9 % IV SOLN
INTRAVENOUS | Status: DC
  Administered 2018-12-19: 07:00:00 via INTRAVENOUS

## 2018-12-18 MED ORDER — LOSARTAN POTASSIUM 25 MG PO TABS
12.5000 mg | ORAL_TABLET | Freq: Every day | ORAL | Status: DC
  Administered 2018-12-18 – 2018-12-19 (×2): 12.5 mg via ORAL
  Filled 2018-12-18 (×2): qty 1

## 2018-12-18 MED ORDER — SODIUM CHLORIDE 0.9% FLUSH: 3.0000 mL | INTRAVENOUS | Status: DC | PRN

## 2018-12-18 NOTE — Progress Notes (Signed)
*  PRELIMINARY RESULTS* Echocardiogram 2D Echocardiogram has been performed.  Edward [name] 12/18/2018, 10:30 AM

## 2018-12-18 NOTE — Progress Notes (Signed)
[facility] - [place] at [facility]   PATIENT NAME: Edward [name]    MR#:  [id number]  DATE OF BIRTH:  28-May-1966  SUBJECTIVE:  CHIEF COMPLAINT:   Chief Complaint  Patient presents with   Shortness of Breath   -Neuropathy in both feet.  Up and ambulating.  No chest pain. -Significantly anxious episodes yesterday requiring Ativan  REVIEW OF SYSTEMS:  Review of Systems  Constitutional: Negative for chills and fever.  Respiratory: Positive for shortness of breath. Negative for cough and wheezing.   Cardiovascular: Positive for chest pain. Negative for palpitations.  Gastrointestinal: Negative for abdominal pain, constipation, diarrhea, nausea and vomiting.  Genitourinary: Negative for dysuria.  Neurological: Positive for sensory change. Negative for dizziness, focal weakness, seizures and headaches.  Psychiatric/Behavioral: The patient is nervous/anxious.     DRUG ALLERGIES:   Allergies  Allergen Reactions   Codeine Nausea And Vomiting    VITALS:  Blood pressure (!) 138/91, pulse 68, temperature 98.2 F (36.8 C), temperature source Oral, resp. rate 16, height 6\' 3"  (1.905 m), weight 87.3 kg, SpO2 98 %.  PHYSICAL EXAMINATION:  Physical Exam   GENERAL:  53 y.o.-year-old patient lying in the bed with no acute distress.  EYES: Pupils equal, round, reactive to light and accommodation. No scleral icterus. Extraocular muscles intact.  HEENT: Head atraumatic, normocephalic. Oropharynx and nasopharynx clear.  NECK:  Supple, no jugular venous distention. No thyroid enlargement, no tenderness.  LUNGS: Normal breath sounds bilaterally, no wheezing, rales,rhonchi or crepitation. No use of accessory muscles of respiration.  CARDIOVASCULAR: S1, S2 normal. No murmurs, rubs, or gallops.  ABDOMEN: Soft, nontender, nondistended. Bowel sounds present. No organomegaly or mass.  EXTREMITIES: No pedal edema, cyanosis, or clubbing.  NEUROLOGIC: Cranial nerves II through XII  are intact. Muscle strength 5/5 in all extremities. Sensation intact. Gait not checked.  PSYCHIATRIC: The patient is alert and oriented x 3.  SKIN: No obvious rash, lesion, or ulcer.    LABORATORY PANEL:   CBC Recent Labs  Lab 12/18/18 0111  WBC 7.0  HGB 14.4  HCT 41.8  PLT 229   ------------------------------------------------------------------------------------------------------------------  Chemistries  Recent Labs  Lab 12/18/18 0111  NA 138  K 3.5  CL 106  CO2 22  GLUCOSE 103*  BUN 9  CREATININE 1.08  CALCIUM 8.3*   ------------------------------------------------------------------------------------------------------------------  Cardiac Enzymes Recent Labs  Lab 12/18/18 0111  TROPONINI 0.03*   ------------------------------------------------------------------------------------------------------------------  RADIOLOGY:  Ct Angio Chest Pe W Or Wo Contrast  Result Date: 12/17/2018 CLINICAL DATA:  Chest pain and shortness of breath EXAM: CT ANGIOGRAPHY CHEST WITH CONTRAST TECHNIQUE: Multidetector CT imaging of the chest was performed using the standard protocol during bolus administration of intravenous contrast. Multiplanar CT image reconstructions and MIPs were obtained to evaluate the vascular anatomy. CONTRAST:  7mL OMNIPAQUE IOHEXOL 350 MG/ML SOLN COMPARISON:  Chest radiograph Dec 17, 2018 FINDINGS: Cardiovascular: There is no demonstrable pulmonary embolus. There is no thoracic aortic aneurysm. No dissection is evident. Note that the contrast bolus in the aorta is not sufficient to assess for potential dissection. There is aortic atherosclerosis. Visualized great vessels appear unremarkable. There is no pericardial effusion or pericardial thickening. Mediastinum/Nodes: Thyroid appears unremarkable. There is no appreciable thoracic adenopathy. No esophageal lesions are evident. Lungs/Pleura: There is no edema or consolidation. There are areas of mild patchy  atelectasis bilaterally. There is no appreciable pleural effusion or pleural thickening. Upper Abdomen: There is reflux of contrast into the inferior vena cava and hepatic veins. Visualized upper  abdominal structures otherwise appear unremarkable. Musculoskeletal: No blastic or lytic bone lesions. No chest wall lesions evident. Review of the MIP images confirms the above findings. IMPRESSION: 1. No demonstrable pulmonary embolus. No thoracic aortic aneurysm. No dissection evident; contrast bolus is not sufficient in the aorta to exclude possible dissection as a differential consideration. There are foci of aortic atherosclerosis. 2. Areas of atelectatic change bilaterally. No edema or consolidation. 3.  No evident thoracic adenopathy. 4. Reflux of contrast into the inferior vena cava and hepatic veins, a finding that may be indicative of a degree of increase in right heart pressure. Aortic Atherosclerosis (ICD10-I70.0). Electronically Signed   By: Bretta Bang III M.D.   On: 12/17/2018 11:37   Dg Chest Port 1 View  Result Date: 12/17/2018 CLINICAL DATA:  Chest pain short of breath EXAM: PORTABLE CHEST 1 VIEW COMPARISON:  12/21/2005 FINDINGS: The heart size and mediastinal contours are within normal limits. Both lungs are clear. The visualized skeletal structures are unremarkable. IMPRESSION: No active disease. Electronically Signed   By: Marlan Palau M.D.   On: 12/17/2018 10:08    EKG:   Orders placed or performed during the hospital encounter of 12/17/18   ED EKG   ED EKG   EKG 12-Lead   EKG 12-Lead    ASSESSMENT AND PLAN:   Edward [name]  is a 52 y.o. male with a known history of polysubstance abuse including alcohol and marijuana, occasional cocaine use presents to hospital secondary to worsening shortness of breath for the last 2 days.  1.  Dyspnea and and chest pain-initially admitted for possible cardiomyopathy since troponin was elevated.  However seems more anxiety  related. -Follow-up echocardiogram.  Troponins are stable. -Appreciate cardiology consult.  No further cardiac work-up at this time.  2.  Hypertension-not sure if this is from cocaine.    Well controlled now.  No history of hypertension.  3.  Anxiety-increase BuSpar, also added Celexa.  Will need outpatient follow-up with psychiatry  4.  Neuropathy-on gabapentin and meloxicam  5.  DVT prophylaxis-Lovenox  6.  Alcohol use-denies quantification of his alcohol use.  Confirms that he does not go into withdrawal in the hospital.  Closely monitor.  If echocardiogram is normal, discharged today    All the records are reviewed and case discussed with Care Management/Social Workerr. Management plans discussed with the patient, family and they are in agreement.  CODE STATUS: Full code  TOTAL TIME TAKING CARE OF THIS PATIENT: 38 minutes.   POSSIBLE D/C today, DEPENDING ON CLINICAL CONDITION.   [name] M.D on 12/18/2018 at 9:37 AM  Between 7am to 6pm - Pager - [phone]  After 6pm go to [email or web address] - Social research officer, government  [facility] [place] [facility]  Office  [phone]  CC: Primary care physician; Evie Lacks, NP

## 2018-12-18 NOTE — Progress Notes (Signed)
    Echo showed reduced LVSF with an EF of 20-25%, mildly dilated LV cavity, global HK, normal RVSF, mild MR.   Given the above, we recommend cardiac catheterization on 12/19/2018 with Dr. Mariah Milling. We cannot exclude ICM vs NICM in the setting of polysubstance abuse. He has not been placed on a beta blocker initially secondary to cocaine positive urine drug screen this admission. Consider starting metoprolol prior to discharge. Will start losartan 12.5 mg daily with consideration to transition to [facility] as an outpatient if he can tolerate ARB and can afford Entresto. Prior to discharge, start spironolactone if able. CHF education discussed. Complete cessation of alcohol, marijuana, and cocaine is recommended. Continue ASA. Risks and benefits of cardiac catheterization have been discussed with the patient including risks of bleeding, bruising, infection, kidney damage, stroke, heart attack, urgent need for bypass, injury to a limb, and death. The patient understands these risks and is willing to proceed with the procedure. All questions have been answered and concerns listened to.

## 2018-12-18 NOTE — Progress Notes (Signed)
Echo with severe cardiomyopathy, no wall motion abnormalities.  EF down to 20 to 25%.  Cardiology recommending a cardiac catheterization.

## 2018-12-18 NOTE — Progress Notes (Signed)
Progress Note  Patient Name: Edward [name] Date of Encounter: 12/18/2018  Primary Cardiologist: new to [facility] - consult by Gollan  Subjective   No chest pain. SOB improved. Less anxious this morning.   Inpatient Medications    Scheduled Meds: . aspirin  81 mg Oral Daily  . busPIRone  10 mg Oral BID  . citalopram  20 mg Oral Daily  . enoxaparin (LOVENOX) injection  40 mg Subcutaneous Q24H  . gabapentin  300 mg Oral Q2200  . meloxicam  15 mg Oral Daily  . omega-3 acid ethyl esters  1 g Oral Daily  . traZODone  50-100 mg Oral QHS  . vitamin B-12  500 mcg Oral Daily  . zinc sulfate  220 mg Oral Daily   Continuous Infusions:  PRN Meds: acetaminophen **OR** acetaminophen, hydrALAZINE, LORazepam, nitroGLYCERIN, ondansetron **OR** ondansetron (ZOFRAN) IV   Vital Signs    Vitals:   12/17/18 1807 12/17/18 1936 12/18/18 0401 12/18/18 0731  BP: 113/66 112/61 112/79 (!) 138/91  Pulse: 85 84 82 68  Resp:      Temp:  98.2 F (36.8 C) 98.9 F (37.2 C) 98.2 F (36.8 C)  TempSrc:  Oral Oral Oral  SpO2: 96% 97% 95% 98%  Weight:   87.3 kg   Height:        Intake/Output Summary (Last 24 hours) at 12/18/2018 6861 Last data filed at 12/18/2018 6837 Gross per 24 hour  Intake -  Output 300 ml  Net -300 ml   Filed Weights   12/17/18 0912 12/18/18 0401  Weight: 90 kg 87.3 kg    Telemetry    SR - Personally Reviewed  ECG    n/a - Personally Reviewed  Physical Exam   GEN: No acute distress. Neck: No JVD. Cardiac: RRR, no murmurs, rubs, or gallops.  Respiratory: Clear to auscultation bilaterally.  GI: Soft, nontender, non-distended.   MS: No edema; No deformity. Neuro:  Alert and oriented x 3; Nonfocal.  Psych: Normal affect. Anxious.   Labs    Chemistry Recent Labs  Lab 12/17/18 0928 12/18/18 0111  NA 136 138  K 3.4* 3.5  CL 105 106  CO2 17* 22  GLUCOSE 122* 103*  BUN 8 9  CREATININE 0.88 1.08  CALCIUM 8.6* 8.3*  GFRNONAA >60 >60  GFRAA >60 >60   ANIONGAP 14 10     Hematology Recent Labs  Lab 12/17/18 0928 12/18/18 0111  WBC 7.6 7.0  RBC 4.62 4.35  HGB 15.2 14.4  HCT 44.1 41.8  MCV 95.5 96.1  MCH 32.9 33.1  MCHC 34.5 34.4  RDW 13.9 14.0  PLT 260 229    Cardiac Enzymes Recent Labs  Lab 12/17/18 0928 12/17/18 1444 12/17/18 1900 12/18/18 0111  TROPONINI 0.04* 0.03* 0.04* 0.03*   No results for input(s): TROPIPOC in the last 168 hours.   BNPNo results for input(s): BNP, PROBNP in the last 168 hours.   DDimer No results for input(s): DDIMER in the last 168 hours.   Radiology    Ct Angio Chest Pe W Or Wo Contrast  Result Date: 12/17/2018 IMPRESSION: 1. No demonstrable pulmonary embolus. No thoracic aortic aneurysm. No dissection evident; contrast bolus is not sufficient in the aorta to exclude possible dissection as a differential consideration. There are foci of aortic atherosclerosis. 2. Areas of atelectatic change bilaterally. No edema or consolidation. 3.  No evident thoracic adenopathy. 4. Reflux of contrast into the inferior vena cava and hepatic veins, a finding that may  be indicative of a degree of increase in right heart pressure. Aortic Atherosclerosis (ICD10-I70.0). Electronically Signed   By: Bretta Bang III M.D.   On: 12/17/2018 11:37   Dg Chest Port 1 View  Result Date: 12/17/2018 IMPRESSION: No active disease. Electronically Signed   By: Marlan Palau M.D.   On: 12/17/2018 10:08    Cardiac Studies   2D Echo pending  Patient Profile     53 y.o. male with history of polysubstance abuse including ongoing cocaine, marijuana, and alcohol abuse, peripheral neuropathy with normal noninvasive vascular imaging, anxiety and panic attacks who is being seen today for the evaluation of SOB/elevated troponin at the request of Dr. Nemiah Commander, MD.  Assessment & Plan    1. Elevated troponin:  -Initial troponin 0.04 with subsequent value down trending to 0.03 -Not consistent with ACS -Possibly supply  demand ischemia in the setting of cocaine abuse  -No indication for heparin gtt at this time without dynamic elevation  -Check echo  -Further recommendations pending echo -ASA   2. Polysubstance abuse:  -He is positive for cocaine and marijuana abuse on drug screen this admission as well as a history of alcohol abuse  -Complete cessation is advised  -Avoid beta blocker with ongoing cocaine abuse   3. HTN:  -Likely exacerbated in the setting of ongoing cocaine and alcohol abuse  -Recommend cessation as above  -Add antihypertensive if BP continues to be elevated  -PRN hydralazine for  4. Anxiety with panic attacks: -Improving -Per IM  For questions or updates, please contact [facility] Please consult [email or web address] for contact info under Cardiology/STEMI.    Signed, Eula Listen, PA-C [facility] Pager: [phone] 12/18/2018, 9:38 AM

## 2018-12-19 ENCOUNTER — Encounter
Admission: EM | Disposition: A | Discharge status: Home / Self Care | Payer: Self-pay | Source: Home / Self Care | Attending: Internal Medicine

## 2018-12-19 ENCOUNTER — Encounter: Payer: Self-pay | Admitting: *Deleted

## 2018-12-19 DIAGNOSIS — Z8719 Personal history of other diseases of the digestive system: Secondary | ICD-10-CM | POA: Diagnosis not present

## 2018-12-19 DIAGNOSIS — Z8249 Family history of ischemic heart disease and other diseases of the circulatory system: Secondary | ICD-10-CM | POA: Diagnosis not present

## 2018-12-19 DIAGNOSIS — Z7289 Other problems related to lifestyle: Secondary | ICD-10-CM | POA: Diagnosis not present

## 2018-12-19 DIAGNOSIS — F1410 Cocaine abuse, uncomplicated: Secondary | ICD-10-CM | POA: Diagnosis present

## 2018-12-19 DIAGNOSIS — I420 Dilated cardiomyopathy: Secondary | ICD-10-CM | POA: Diagnosis not present

## 2018-12-19 DIAGNOSIS — F1210 Cannabis abuse, uncomplicated: Secondary | ICD-10-CM | POA: Diagnosis present

## 2018-12-19 DIAGNOSIS — I110 Hypertensive heart disease with heart failure: Secondary | ICD-10-CM | POA: Diagnosis present

## 2018-12-19 DIAGNOSIS — R0602 Shortness of breath: Secondary | ICD-10-CM | POA: Diagnosis not present

## 2018-12-19 DIAGNOSIS — B009 Herpesviral infection, unspecified: Secondary | ICD-10-CM | POA: Diagnosis present

## 2018-12-19 DIAGNOSIS — Z7151 Drug abuse counseling and surveillance of drug abuser: Secondary | ICD-10-CM | POA: Diagnosis not present

## 2018-12-19 DIAGNOSIS — Z1159 Encounter for screening for other viral diseases: Secondary | ICD-10-CM | POA: Diagnosis not present

## 2018-12-19 DIAGNOSIS — Z79899 Other long term (current) drug therapy: Secondary | ICD-10-CM | POA: Diagnosis not present

## 2018-12-19 DIAGNOSIS — I5021 Acute systolic (congestive) heart failure: Secondary | ICD-10-CM | POA: Diagnosis present

## 2018-12-19 DIAGNOSIS — F410 Panic disorder [episodic paroxysmal anxiety] without agoraphobia: Secondary | ICD-10-CM | POA: Diagnosis present

## 2018-12-19 DIAGNOSIS — I428 Other cardiomyopathies: Secondary | ICD-10-CM | POA: Diagnosis present

## 2018-12-19 DIAGNOSIS — Z885 Allergy status to narcotic agent status: Secondary | ICD-10-CM | POA: Diagnosis not present

## 2018-12-19 DIAGNOSIS — F419 Anxiety disorder, unspecified: Secondary | ICD-10-CM | POA: Diagnosis not present

## 2018-12-19 DIAGNOSIS — R079 Chest pain, unspecified: Secondary | ICD-10-CM | POA: Diagnosis not present

## 2018-12-19 DIAGNOSIS — R7989 Other specified abnormal findings of blood chemistry: Secondary | ICD-10-CM | POA: Diagnosis present

## 2018-12-19 DIAGNOSIS — G629 Polyneuropathy, unspecified: Secondary | ICD-10-CM | POA: Diagnosis present

## 2018-12-19 HISTORY — PX: LEFT HEART CATH AND CORONARY ANGIOGRAPHY: CATH118249

## 2018-12-19 LAB — BASIC METABOLIC PANEL
Anion gap: 7 (ref 5–15)
BUN: 14 mg/dL (ref 6–20)
CO2: 23 mmol/L (ref 22–32)
Calcium: 8.6 mg/dL — ABNORMAL LOW (ref 8.9–10.3)
Chloride: 107 mmol/L (ref 98–111)
Creatinine, Ser: 1.16 mg/dL (ref 0.61–1.24)
GFR calc Af Amer: >60 mL/min (ref >60)
GFR calc non Af Amer: >60 mL/min (ref >60)
Glucose, Bld: 98 mg/dL (ref 70–99)
Potassium: 4 mmol/L (ref 3.5–5.1)
Sodium: 137 mmol/L (ref 135–145)

## 2018-12-19 LAB — CBC
HCT: 45.8 % (ref 39.0–52.0)
Hemoglobin: 15.6 g/dL (ref 13.0–17.0)
MCH: 32.9 pg (ref 26.0–34.0)
MCHC: 34.1 g/dL (ref 30.0–36.0)
MCV: 96.6 fL (ref 80.0–100.0)
Platelets: 239 10*3/uL (ref 150–400)
RBC: 4.74 MIL/uL (ref 4.22–5.81)
RDW: 13.5 % (ref 11.5–15.5)
WBC: 6.6 10*3/uL (ref 4.0–10.5)
nRBC: 0 % (ref 0.0–0.2)

## 2018-12-19 LAB — CARDIAC CATHETERIZATION: Cath EF Quantitative: 20 %

## 2018-12-19 SURGERY — LEFT HEART CATH AND CORONARY ANGIOGRAPHY
Anesthesia: Moderate Sedation

## 2018-12-19 MED ORDER — HEPARIN (PORCINE) IN NACL 1000-0.9 UT/500ML-% IV SOLN
INTRAVENOUS | Status: AC
  Filled 2018-12-19: qty 1000

## 2018-12-19 MED ORDER — IOPAMIDOL (ISOVUE-300) INJECTION 61%
INTRAVENOUS | Status: DC | PRN
  Administered 2018-12-19: 12:00:00 70 mL via INTRA_ARTERIAL

## 2018-12-19 MED ORDER — MIDAZOLAM HCL 2 MG/2ML IJ SOLN
INTRAMUSCULAR | Status: DC | PRN
  Administered 2018-12-19 (×2): 1 mg via INTRAVENOUS

## 2018-12-19 MED ORDER — ACETAMINOPHEN 325 MG PO TABS: 650.0000 mg | ORAL_TABLET | ORAL | Status: DC | PRN

## 2018-12-19 MED ORDER — SACUBITRIL-VALSARTAN 24-26 MG PO TABS
1.0000 | ORAL_TABLET | Freq: Two times a day (BID) | ORAL | Status: DC
  Administered 2018-12-19 – 2018-12-20 (×3): 1 via ORAL
  Filled 2018-12-19 (×3): qty 1

## 2018-12-19 MED ORDER — SODIUM CHLORIDE 0.9% FLUSH
3.0000 mL | Freq: Two times a day (BID) | INTRAVENOUS | Status: DC
  Administered 2018-12-19 – 2018-12-20 (×2): 3 mL via INTRAVENOUS

## 2018-12-19 MED ORDER — ONDANSETRON HCL 4 MG/2ML IJ SOLN: 4.0000 mg | Freq: Four times a day (QID) | INTRAMUSCULAR | Status: DC | PRN

## 2018-12-19 MED ORDER — SPIRONOLACTONE 25 MG PO TABS
12.5000 mg | ORAL_TABLET | Freq: Every day | ORAL | Status: DC
  Administered 2018-12-19 – 2018-12-20 (×2): 12.5 mg via ORAL
  Filled 2018-12-19 (×2): qty 1
  Filled 2018-12-19 (×2): qty 0.5

## 2018-12-19 MED ORDER — SODIUM CHLORIDE 0.9 % IV SOLN: 250.0000 mL | INTRAVENOUS | Status: DC | PRN

## 2018-12-19 MED ORDER — SODIUM CHLORIDE 0.9% FLUSH: 3.0000 mL | INTRAVENOUS | Status: DC | PRN

## 2018-12-19 MED ORDER — LABETALOL HCL 5 MG/ML IV SOLN: 10.0000 mg | INTRAVENOUS | Status: AC | PRN

## 2018-12-19 MED ORDER — HYDRALAZINE HCL 20 MG/ML IJ SOLN: 10.0000 mg | INTRAMUSCULAR | Status: AC | PRN

## 2018-12-19 MED ORDER — FUROSEMIDE 40 MG PO TABS
40.0000 mg | ORAL_TABLET | Freq: Every day | ORAL | Status: DC
  Administered 2018-12-19: 40 mg via ORAL
  Filled 2018-12-19: qty 1

## 2018-12-19 MED ORDER — MIDAZOLAM HCL 2 MG/2ML IJ SOLN
INTRAMUSCULAR | Status: AC
  Filled 2018-12-19: qty 2

## 2018-12-19 MED ORDER — FENTANYL CITRATE (PF) 100 MCG/2ML IJ SOLN
INTRAMUSCULAR | Status: DC | PRN
  Administered 2018-12-19 (×2): 50 ug via INTRAVENOUS

## 2018-12-19 MED ORDER — FENTANYL CITRATE (PF) 100 MCG/2ML IJ SOLN
INTRAMUSCULAR | Status: AC
  Filled 2018-12-19: qty 2

## 2018-12-19 SURGICAL SUPPLY — 9 items
CATH INFINITI 5FR ANG PIGTAIL (CATHETERS) IMPLANT
CATH INFINITI 5FR JL4 (CATHETERS) ×3 IMPLANT
CATH INFINITI JR4 5F (CATHETERS) ×3 IMPLANT
DEVICE CLOSURE MYNXGRIP 5F (Vascular Products) ×3 IMPLANT
KIT MANI 3VAL PERCEP (MISCELLANEOUS) ×3 IMPLANT
NEEDLE PERC 18GX7CM (NEEDLE) ×3 IMPLANT
PACK CARDIAC CATH (CUSTOM PROCEDURE TRAY) ×3 IMPLANT
SHEATH AVANTI 5FR X 11CM (SHEATH) ×3 IMPLANT
WIRE GUIDERIGHT .035X150 (WIRE) ×3 IMPLANT

## 2018-12-19 NOTE — Progress Notes (Signed)
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 Cardiology started him on entresto, lasix and aldactone- recommended monitoring today on meds.

## 2018-12-19 NOTE — Progress Notes (Signed)
[employer] [facility] - [place] at [facility]   PATIENT NAME: Edward [name]    MR#:  [id number]  DATE OF BIRTH:  Sep 22, 1965  SUBJECTIVE:  CHIEF COMPLAINT:   Chief Complaint  Patient presents with  . Shortness of Breath   -Feels well, denies any complaints.  Going for cardiac cath this morning.  REVIEW OF SYSTEMS:  Review of Systems  Constitutional: Negative for chills and fever.  Respiratory: Negative for cough, shortness of breath and wheezing.   Cardiovascular: Negative for chest pain and palpitations.  Gastrointestinal: Negative for abdominal pain, constipation, diarrhea, nausea and vomiting.  Genitourinary: Negative for dysuria.  Neurological: Negative for dizziness, sensory change, focal weakness, seizures and headaches.  Psychiatric/Behavioral: The patient is nervous/anxious.     DRUG ALLERGIES:   Allergies  Allergen Reactions  . Codeine Nausea And Vomiting    VITALS:  Blood pressure 131/88, pulse 63, temperature 98.1 F (36.7 C), temperature source Oral, resp. rate 18, height 6\' 3"  (1.905 m), weight 87.1 kg, SpO2 97 %.  PHYSICAL EXAMINATION:  Physical Exam   GENERAL:  53 y.o.-year-old patient lying in the bed with no acute distress.  EYES: Pupils equal, round, reactive to light and accommodation. No scleral icterus. Extraocular muscles intact.  HEENT: Head atraumatic, normocephalic. Oropharynx and nasopharynx clear.  NECK:  Supple, no jugular venous distention. No thyroid enlargement, no tenderness.  LUNGS: Normal breath sounds bilaterally, no wheezing, rales,rhonchi or crepitation. No use of accessory muscles of respiration.  CARDIOVASCULAR: S1, S2 normal. No murmurs, rubs, or gallops.  ABDOMEN: Soft, nontender, nondistended. Bowel sounds present. No organomegaly or mass.  EXTREMITIES: No pedal edema, cyanosis, or clubbing.  NEUROLOGIC: Cranial nerves II through XII are intact. Muscle strength 5/5 in all extremities. Sensation intact. Gait not checked.   PSYCHIATRIC: The patient is alert and oriented x 3.  SKIN: No obvious rash, lesion, or ulcer.    LABORATORY PANEL:   CBC Recent Labs  Lab 12/18/18 0111  WBC 7.0  HGB 14.4  HCT 41.8  PLT 229   ------------------------------------------------------------------------------------------------------------------  Chemistries  Recent Labs  Lab 12/19/18 0342  NA 137  K 4.0  CL 107  CO2 23  GLUCOSE 98  BUN 14  CREATININE 1.16  CALCIUM 8.6*   ------------------------------------------------------------------------------------------------------------------  Cardiac Enzymes Recent Labs  Lab 12/18/18 0111  TROPONINI 0.03*   ------------------------------------------------------------------------------------------------------------------  RADIOLOGY:  Ct Angio Chest Pe W Or Wo Contrast  Result Date: 12/17/2018 CLINICAL DATA:  Chest pain and shortness of breath EXAM: CT ANGIOGRAPHY CHEST WITH CONTRAST TECHNIQUE: Multidetector CT imaging of the chest was performed using the standard protocol during bolus administration of intravenous contrast. Multiplanar CT image reconstructions and MIPs were obtained to evaluate the vascular anatomy. CONTRAST:  22mL OMNIPAQUE IOHEXOL 350 MG/ML SOLN COMPARISON:  Chest radiograph Dec 17, 2018 FINDINGS: Cardiovascular: There is no demonstrable pulmonary embolus. There is no thoracic aortic aneurysm. No dissection is evident. Note that the contrast bolus in the aorta is not sufficient to assess for potential dissection. There is aortic atherosclerosis. Visualized great vessels appear unremarkable. There is no pericardial effusion or pericardial thickening. Mediastinum/Nodes: Thyroid appears unremarkable. There is no appreciable thoracic adenopathy. No esophageal lesions are evident. Lungs/Pleura: There is no edema or consolidation. There are areas of mild patchy atelectasis bilaterally. There is no appreciable pleural effusion or pleural thickening. Upper  Abdomen: There is reflux of contrast into the inferior vena cava and hepatic veins. Visualized upper abdominal structures otherwise appear unremarkable. Musculoskeletal: No blastic or lytic bone lesions.  No chest wall lesions evident. Review of the MIP images confirms the above findings. IMPRESSION: 1. No demonstrable pulmonary embolus. No thoracic aortic aneurysm. No dissection evident; contrast bolus is not sufficient in the aorta to exclude possible dissection as a differential consideration. There are foci of aortic atherosclerosis. 2. Areas of atelectatic change bilaterally. No edema or consolidation. 3.  No evident thoracic adenopathy. 4. Reflux of contrast into the inferior vena cava and hepatic veins, a finding that may be indicative of a degree of increase in right heart pressure. Aortic Atherosclerosis (ICD10-I70.0). Electronically Signed   By: Bretta Bang III M.D.   On: 12/17/2018 11:37    EKG:   Orders placed or performed during the hospital encounter of 12/17/18  . ED EKG  . ED EKG  . EKG 12-Lead  . EKG 12-Lead    ASSESSMENT AND PLAN:   Edward [name]  is a 53 y.o. male with a known history of polysubstance abuse including alcohol and marijuana, occasional cocaine use presents to hospital secondary to worsening shortness of breath for the last 2 days.  1.  Acute systolic heart failure-well compensated.  Echocardiogram showing severely reduced EF of 25%, global hypokinesis. -Appreciate cardiology consult.  Going for cardiac cath to rule out any ischemic causes. -Troponin has remained stable during this hospital course. -Started on CHF medications.  He is on losartan for now.  Will need Aldactone at discharge.  Can be changed to [facility] as outpatient -Beta-blocker not started given history of cocaine abuse and positive cocaine on admission  2.  Hypertension-on low-dose losartan.  3.  Anxiety-increased BuSpar, also added Celexa.  Will need outpatient follow-up with psychiatry   4.  Neuropathy-on gabapentin and meloxicam  5.  DVT prophylaxis-Lovenox  6.  Alcohol use-not going into withdrawals.  Counseled about polysubstance abuse.  Continue to monitor  Discharge plans based on cardiac cath results today    All the records are reviewed and case discussed with Care Management/Social Workerr. Management plans discussed with the patient, family and they are in agreement.  CODE STATUS: Full code  TOTAL TIME TAKING CARE OF THIS PATIENT: 39 minutes.   POSSIBLE D/C today, DEPENDING ON CLINICAL CONDITION.   [name] M.D on 12/19/2018 at 10:36 AM  Between 7am to 6pm - Pager - [phone]  After 6pm go to [email or web address] - Social research officer, government  [facility] [place] [facility]  Office  [phone]  CC: Primary care physician; Evie Lacks, NP

## 2018-12-19 NOTE — Progress Notes (Signed)
Progress Note  Patient Name: Edward [name] Date of Encounter: 12/19/2018  Primary Cardiologist: [facility]  Subjective   At rest no symptoms, with exertion some shortness of breath Shortness of breath associated with some anxiety Catheterization this morning showing severely reduced ejection fraction 20%, elevated end-diastolic pressure No significant coronary disease indicating dilated cardiomyopathy, nonischemic   Inpatient Medications    Scheduled Meds: . [MAR Hold] aspirin  81 mg Oral Daily  . [MAR Hold] busPIRone  10 mg Oral BID  . [MAR Hold] citalopram  20 mg Oral Daily  . [MAR Hold] enoxaparin (LOVENOX) injection  40 mg Subcutaneous Q24H  . [MAR Hold] gabapentin  300 mg Oral Q2200  . [MAR Hold] losartan  12.5 mg Oral Daily  . [MAR Hold] meloxicam  15 mg Oral Daily  . [MAR Hold] omega-3 acid ethyl esters  1 g Oral Daily  . sodium chloride flush  3 mL Intravenous Q12H  . sodium chloride flush  3 mL Intravenous Q12H  . [MAR Hold] traZODone  50-100 mg Oral QHS  . [MAR Hold] vitamin B-12  500 mcg Oral Daily  . [MAR Hold] zinc sulfate  220 mg Oral Daily   Continuous Infusions: . sodium chloride    . sodium chloride 10 mL/hr at 12/19/18 0654   PRN Meds: sodium chloride, [MAR Hold] acetaminophen **OR** [MAR Hold] acetaminophen, [MAR Hold] hydrALAZINE, [MAR Hold] LORazepam, [MAR Hold] nitroGLYCERIN, [MAR Hold] ondansetron **OR** [MAR Hold] ondansetron (ZOFRAN) IV, sodium chloride flush   Vital Signs    Vitals:   12/19/18 0651 12/19/18 0813 12/19/18 1035 12/19/18 1131  BP:  131/88 (!) 155/96   Pulse:  63 63   Resp:   15   Temp:  98.1 F (36.7 C) 98.1 F (36.7 C)   TempSrc:  Oral Oral   SpO2:  97% 98% 98%  Weight: 87.1 kg  87.5 kg   Height:   6\' 3"  (1.905 m)     Intake/Output Summary (Last 24 hours) at 12/19/2018 1216 Last data filed at 12/19/2018 0215 Gross per 24 hour  Intake -  Output 0 ml  Net 0 ml   Last 3 Weights 12/19/2018 12/19/2018 12/18/2018   Weight (lbs) 193 lb 192 lb 192 lb 6.4 oz  Weight (kg) 87.544 kg 87.091 kg 87.272 kg      Telemetry    NSR - Personally Reviewed  ECG     - Personally Reviewed  Physical Exam   GEN: No acute distress.   Neck:  JVD 8+ Cardiac: RRR, no murmurs, rubs, or gallops.  Respiratory: Clear to auscultation bilaterally. GI: Soft, nontender, non-distended  MS: No edema; No deformity. Neuro:  Nonfocal  Psych: Normal affect , mild anxiety  Labs    Chemistry Recent Labs  Lab 12/17/18 0928 12/18/18 0111 12/19/18 0342  NA 136 138 137  K 3.4* 3.5 4.0  CL 105 106 107  CO2 17* 22 23  GLUCOSE 122* 103* 98  BUN 8 9 14   CREATININE 0.88 1.08 1.16  CALCIUM 8.6* 8.3* 8.6*  GFRNONAA >60 >60 >60  GFRAA >60 >60 >60  ANIONGAP 14 10 7      Hematology Recent Labs  Lab 12/17/18 0928 12/18/18 0111  WBC 7.6 7.0  RBC 4.62 4.35  HGB 15.2 14.4  HCT 44.1 41.8  MCV 95.5 96.1  MCH 32.9 33.1  MCHC 34.5 34.4  RDW 13.9 14.0  PLT 260 229    Cardiac Enzymes Recent Labs  Lab 12/17/18 0928 12/17/18 1444 12/17/18 1900 12/18/18  0111  TROPONINI 0.04* 0.03* 0.04* 0.03*   No results for input(s): TROPIPOC in the last 168 hours.   BNPNo results for input(s): BNP, PROBNP in the last 168 hours.   DDimer No results for input(s): DDIMER in the last 168 hours.   Radiology    No results found.  Cardiac Studies   Echo  1. The left ventricle has severely reduced systolic function, with an ejection fraction of 20-25%. The cavity size was mildly dilated. Left ventricular diastolic Doppler parameters are indeterminate. Left ventrical global hypokinesis without regional  wall motion abnormalities.  2. The right ventricle has normal systolic function. The cavity was normal. There is no increase in right ventricular wall thickness.  Cardiac catheterization today No significant coronary disease, ejection fraction confirmed 20% global hypokinesis dilated LV, elevated end-diastolic pressure 20    Patient Profile     53 y.o. male 53 year old gentleman with history of alcohol abuse, cocaine marijuana, anxiety/panic attacks, being seen for shortness of breath, elevated troponin Echocardiogram confirming ejection fraction 20%   Assessment & Plan    Dilated cardiomyopathy/acute systolic CHF Catheterization today, nonischemic Dilated LV with severely reduced ejection fraction 20% Etiology unclear, denies excessive alcohol Would recommend we transition losartan to Entresto 24/26 mg p.o. twice daily Lasix 40 mg daily , spironolactone 12.5 daily -Given recent cocaine positive will delay on start of low-dose carvedilol Further titration of medications tomorrow depending on blood pressure  Polysubstance abuse/cocaine/marijuana/alcohol Cessation recommended in light of above Recent cocaine abuse, recommended alcohol cessation  Panic attacks/anxiety  history of anxiety Possibly exacerbated by polysubstance abuse, well as cardiomyopathy Previously on Klonopin, slept better  BuSpar did not seem to work for him Recommend psychiatry outpatient follow-up Reassurance provided   Total encounter time more than 25 minutes  Greater than 50% was spent in counseling and coordination of care with the patient   For questions or updates, please contact [facility] Please consult [email or web address] for contact info under        Signed, Julien Nordmann, MD  12/19/2018, 12:16 PM

## 2018-12-19 NOTE — Plan of Care (Signed)
  Problem: Safety: Goal: Ability to remain free from injury will improve Outcome: Progressing   Problem: Activity: Goal: Ability to tolerate increased activity will improve Outcome: Progressing   Problem: Cardiac: Goal: Ability to achieve and maintain adequate cardiovascular perfusion will improve Outcome: Progressing   

## 2018-12-19 NOTE — Progress Notes (Signed)
Cardiac Rehab EP Note  CHF Education:?? Educational session with patient completed.  Provided patient with "Living Better with Heart Failure" packet. Briefly reviewed definition of heart failure and signs and symptoms of an exacerbation.?Discussed potential causes of CHF.  Explained to patient that HF is a chronic illness which requires self-assessment / self-management along with help from the cardiologist/PCP.??Discussed definition of EF measurement along with normal value compared to patient's EF of 20-25%. This EP discussed recreational drug and alcohol abuse contributing to the weakening of patient's heart and the importance of stopping those habits.   ? *Reviewed importance of and reason behind checking weight daily in the AM, after using the bathroom, but before getting dressed. Patient is going to purchase scales to weigh and record daily.    *Reviewed with patient the following information: *Discussed when to call the Dr= weight gain of >2-3lb overnight of 5lb in a week,  *Discussed yellow zone= call MD: weight gain of >2-3lb overnight of 5lb in a week, increased swelling, increased SOB when lying down, chest discomfort, dizziness, increased fatigue *Red Zone= call 911: struggle to breath, fainting or near fainting, significant chest pain ?  *Heart Failure Zone Magnet given and reviewed with patient.   ? *Diet - Patient currently ordered Heart Healthy Diet.  Referral for Dietitian Consultation for diet education has been ordered. Instructed patient to follow a low sodium diet of 2000 mg or less.  Recommended foods for low sodium heart healthy nutrition. Reviewed with patient steps to reading a food label with close attention to serving size and mg of sodium.   ? *Discussed fluid intake with patient as well. Patient not currently on a fluid restriction, but advised no more than 64 ounces of fluid per day. Demonstrated this volume to patient using the bedside water pitcher.   ? *Instructed  patient to take medications as prescribed for heart failure. Explained briefly why patient is on the medications (either make you feel better, live longer or keep you out of the hospital) and discussed monitoring and side effects. Patient expressed desire to stay out of the hospital and be healthier. ? *Discussed exercise / activity. Patient has an active job but does not exercise. Patient is mobile and does not need an assistive device. This EP encouraged patient to be as active as possible. Patient was given brochure for Cardiac Rehab.   ? *Smoking Cessation- Patient is a NEVER smoker.   ? *[facility] - Explained the purpose of the HF Clinic. ?Explained to patient the HF Clinic does not replace PCP nor Cardiologist, but is an additional resource to helping patient manage heart failure at home. Patient would like to think about being a patient at the [facility]. ? Again, the 5 Steps to Living Better with Heart Failure were reviewed with patient.   Patient thanked me for providing the above information. ?  Nelson Chimes, BS [place]  [facility] Cardiac & Pulmonary Re[facility]  Exercise Physiologist Department Phone #: [phone] Fax: [phone]  Direct Line [phone] Email Address: [email or web address]

## 2018-12-19 NOTE — Progress Notes (Signed)
     Edward [name] was admitted to the [facility] on 12/17/2018 for an acute cardiac condition and is being Discharged on  12/20/18  He cannot lift heavy weights and will not be able to work until cleared by his cardiologist in 2 weeks.  so advised to stay away from work until then. So please excuse him from work for the above days  Call [name]  MD, [facility] at  [phone] with questions.  [name] M.D on 12/19/2018,at 12:32 PM  [facility] [address] [place] [address]

## 2018-12-20 LAB — BASIC METABOLIC PANEL
Anion gap: 9 (ref 5–15)
BUN: 13 mg/dL (ref 6–20)
CO2: 22 mmol/L (ref 22–32)
Calcium: 8.9 mg/dL (ref 8.9–10.3)
Chloride: 106 mmol/L (ref 98–111)
Creatinine, Ser: 1.06 mg/dL (ref 0.61–1.24)
GFR calc Af Amer: >60 mL/min (ref >60)
GFR calc non Af Amer: >60 mL/min (ref >60)
Glucose, Bld: 159 mg/dL — ABNORMAL HIGH (ref 70–99)
Potassium: 3.8 mmol/L (ref 3.5–5.1)
Sodium: 137 mmol/L (ref 135–145)

## 2018-12-20 LAB — NOVEL CORONAVIRUS, NAA (HOSP ORDER, SEND-OUT TO REF LAB; TAT 18-24 HRS): SARS-CoV-2, NAA: NOT DETECTED

## 2018-12-20 MED ORDER — FUROSEMIDE 40 MG PO TABS: 40.0000 mg | ORAL_TABLET | Freq: Every day | ORAL | 0 refills | Status: DC

## 2018-12-20 MED ORDER — SACUBITRIL-VALSARTAN 24-26 MG PO TABS: 1.0000 | ORAL_TABLET | Freq: Two times a day (BID) | ORAL | 0 refills | Status: DC

## 2018-12-20 MED ORDER — SPIRONOLACTONE 25 MG PO TABS: 12.5000 mg | ORAL_TABLET | Freq: Every day | ORAL | 0 refills | Status: DC

## 2018-12-20 MED ORDER — ASPIRIN 81 MG PO CHEW: 81.0000 mg | CHEWABLE_TABLET | Freq: Every day | ORAL | 0 refills | Status: DC

## 2018-12-20 NOTE — Discharge Summary (Signed)
[facility] [place] [facility]   PATIENT NAME: Edward MayersRonald [name]    MR#:  [id number]  DATE OF BIRTH:  03-01-1966  DATE OF ADMISSION:  12/17/2018 ADMITTING PHYSICIAN: Enid Baasadhika Kalisetti, MD  DATE OF DISCHARGE: 12/20/2018   PRIMARY CARE PHYSICIAN: Evie LacksPointer, Elizabeth, NP    ADMISSION DIAGNOSIS:  Elevated troponin I level [R79.89] Dyspnea, unspecified type [R06.00]  DISCHARGE DIAGNOSIS:  Active Problems:   Chest pain   SECONDARY DIAGNOSIS:   Past Medical History:  Diagnosis Date  . Alcohol abuse   . Colon polyp 2016  . Herpes   . Polysubstance abuse (HCC)   . Vitamin B 12 deficiency     HOSPITAL COURSE:   RonaldFoleyis a53 y.o.malewith a known history of polysubstance abuse including alcohol and marijuana, occasional cocaine use presents to hospital secondary to worsening shortness of breath for the last 2 days.  1.  Acute systolic heart failure-well compensated.  Echocardiogram showing severely reduced EF of 25%, global hypokinesis. -Appreciate cardiology consult.  Cardiac cath negative- rule out any ischemic causes. -Troponin has remained stable during this hospital course. -Started on CHF medications.   -Beta-blocker not started given history of cocaine abuse and positive cocaine on admission -Cardiology started on Entresto and Spironolactone and patient tolerated it well.  2. Hypertension-Spironolactone and Entresto as mentioned above.  3. Anxiety-increased BuSpar, also added Celexa.  Will need outpatient follow-up with psychiatry  4. Neuropathy-on gabapentin and meloxicam  5. DVT prophylaxis-Lovenox  6. Alcohol use-not going into withdrawals.  Counseled about polysubstance abuse.  Continue to monitor  DISCHARGE CONDITIONS:   Stable  CONSULTS OBTAINED:  Treatment Team:  Antonieta IbaGollan, Timothy J, MD  DRUG ALLERGIES:   Allergies  Allergen Reactions  . Codeine Nausea And Vomiting    DISCHARGE MEDICATIONS:    Allergies as of 12/20/2018      Reactions   Codeine Nausea And Vomiting      Medication List    TAKE these medications   aspirin 81 MG chewable tablet Chew 1 tablet (81 mg total) by mouth daily. Start taking on:  Dec 21, 2018   busPIRone 10 MG tablet Commonly known as:  BUSPAR Take 10 mg by mouth at bedtime as needed for anxiety.   clonazePAM 0.5 MG tablet Commonly known as:  KLONOPIN Take 0.5 mg by mouth 2 (two) times a day.   co-enzyme Q-10 30 MG capsule Take 30 mg by mouth daily.   cyclobenzaprine 10 MG tablet Commonly known as:  FLEXERIL Take 5-10 mg by mouth at bedtime as needed for muscle pain.   Fish Oil 1000 MG Cpdr Take 1,000 mg by mouth daily.   furosemide 40 MG tablet Commonly known as:  LASIX Take 1 tablet (40 mg total) by mouth daily.   gabapentin 300 MG capsule Commonly known as:  NEURONTIN Take 300 mg by mouth daily.   meloxicam 15 MG tablet Commonly known as:  MOBIC Take 15 mg by mouth daily.   sacubitril-valsartan 24-26 MG Commonly known as:  ENTRESTO Take 1 tablet by mouth 2 (two) times daily.   spironolactone 25 MG tablet Commonly known as:  ALDACTONE Take 0.5 tablets (12.5 mg total) by mouth daily. Start taking on:  Dec 21, 2018   traZODone 50 MG tablet Commonly known as:  DESYREL Take 50-100 mg by mouth at bedtime.   vitamin B-12 500 MCG tablet Commonly known as:  CYANOCOBALAMIN Take 500 mcg by mouth daily.   ZINC-220 PO Take 220 mg by mouth daily.  DISCHARGE INSTRUCTIONS:    Follow with cardiology clinic in 1 to 2 weeks.  If you experience worsening of your admission symptoms, develop shortness of breath, life threatening emergency, suicidal or homicidal thoughts you must seek medical attention immediately by calling 911 or calling your MD immediately  if symptoms less severe.  You Must read complete instructions/literature along with all the possible adverse reactions/side effects for all the Medicines you take  and that have been prescribed to you. Take any new Medicines after you have completely understood and accept all the possible adverse reactions/side effects.   Please note  You were cared for by a hospitalist during your hospital stay. If you have any questions about your discharge medications or the care you received while you were in the hospital after you are discharged, you can call the unit and asked to speak with the hospitalist on call if the hospitalist that took care of you is not available. Once you are discharged, your primary care physician will handle any further medical issues. Please note that NO REFILLS for any discharge medications will be authorized once you are discharged, as it is imperative that you return to your primary care physician (or establish a relationship with a primary care physician if you do not have one) for your aftercare needs so that they can reassess your need for medications and monitor your lab values.    Today   CHIEF COMPLAINT:   Chief Complaint  Patient presents with  . Shortness of Breath    HISTORY OF PRESENT ILLNESS:  Edward [name]  is a 53 y.o. male with a known history of polysubstance abuse including alcohol and marijuana, occasional cocaine use presents to hospital secondary to worsening shortness of breath for the last 2 days. Patient is a poor historian.  He has occasional swelling at his ankles at the end of the day which improves by itself.  For the last couple of days he has noted that he is getting extremely short of breath even by bending to tie his shoelaces and so presented to the emergency room.  He has an occasional dry cough which is not new.  Denies any fevers but had chills yesterday.  No nausea or vomiting.  No exposure to sick contacts or recent travel.  Chest x-ray here is negative and CT angios negative for PE in the emergency room.  His first troponin is elevated at 0.04 and EKG indicating LVH.  He is being admitted for the  same.  VITAL SIGNS:  Blood pressure 138/89, pulse 79, temperature 98.3 F (36.8 C), temperature source Oral, resp. rate 20, height  (1.905 m), weight 87.5 kg, SpO2 99 %.  I/O:    Intake/Output Summary (Last 24 hours) at 12/20/2018 1306 Last data filed at 12/20/2018 1005 Gross per 24 hour  Intake 483 ml  Output -  Net 483 ml    PHYSICAL EXAMINATION:   GENERAL:  53 y.o.-year-old patient lying in the bed with no acute distress.  EYES: Pupils equal, round, reactive to light and accommodation. No scleral icterus. Extraocular muscles intact.  HEENT: Head atraumatic, normocephalic. Oropharynx and nasopharynx clear.  NECK:  Supple, no jugular venous distention. No thyroid enlargement, no tenderness.  LUNGS: Normal breath sounds bilaterally, no wheezing, rales,rhonchi or crepitation. No use of accessory muscles of respiration.  CARDIOVASCULAR: S1, S2 normal. No murmurs, rubs, or gallops.  ABDOMEN: Soft, nontender, nondistended. Bowel sounds present. No organomegaly or mass.  EXTREMITIES: No pedal edema, cyanosis, or clubbing.  NEUROLOGIC: Cranial nerves II through XII are intact. Muscle strength 5/5 in all extremities. Sensation intact. Gait not checked.  PSYCHIATRIC: The patient is alert and oriented x 3.  SKIN: No obvious rash, lesion, or ulcer.   DATA REVIEW:   CBC Recent Labs  Lab 12/19/18 1316  WBC 6.6  HGB 15.6  HCT 45.8  PLT 239    Chemistries  Recent Labs  Lab 12/20/18 0904  NA 137  K 3.8  CL 106  CO2 22  GLUCOSE 159*  BUN 13  CREATININE 1.06  CALCIUM 8.9    Cardiac Enzymes Recent Labs  Lab 12/18/18 0111  TROPONINI 0.03*    Microbiology Results  Results for orders placed or performed during the hospital encounter of 12/17/18  SARS Coronavirus 2 (CEPHEID - Performed in [facility] hospital lab), Hosp Order     Status: None   Collection Time: 12/17/18  9:34 AM  Result Value Ref Range Status   SARS Coronavirus 2 NEGATIVE NEGATIVE Final    Comment:  (NOTE) If result is NEGATIVE SARS-CoV-2 target nucleic acids are NOT DETECTED. The SARS-CoV-2 RNA is generally detectable in upper and lower  respiratory specimens during the acute phase of infection. The lowest  concentration of SARS-CoV-2 viral copies this assay can detect is 250  copies / mL. A negative result does not preclude SARS-CoV-2 infection  and should not be used as the sole basis for treatment or other  patient management decisions.  A negative result may occur with  improper specimen collection / handling, submission of specimen other  than nasopharyngeal swab, presence of viral mutation(s) within the  areas targeted by this assay, and inadequate number of viral copies  (<250 copies / mL). A negative result must be combined with clinical  observations, patient history, and epidemiological information. If result is POSITIVE SARS-CoV-2 target nucleic acids are DETECTED. The SARS-CoV-2 RNA is generally detectable in upper and lower  respiratory specimens dur ing the acute phase of infection.  Positive  results are indicative of active infection with SARS-CoV-2.  Clinical  correlation with patient history and other diagnostic information is  necessary to determine patient infection status.  Positive results do  not rule out bacterial infection or co-infection with other viruses. If result is PRESUMPTIVE POSTIVE SARS-CoV-2 nucleic acids MAY BE PRESENT.   A presumptive positive result was obtained on the submitted specimen  and confirmed on repeat testing.  While 2019 novel coronavirus  (SARS-CoV-2) nucleic acids may be present in the submitted sample  additional confirmatory testing may be necessary for epidemiological  and / or clinical management purposes  to differentiate between  SARS-CoV-2 and other Sarbecovirus currently known to infect humans.  If clinically indicated additional testing with an alternate test  methodology [phone]) is advised. The SARS-CoV-2 RNA is  generally  detectable in upper and lower respiratory sp ecimens during the acute  phase of infection. The expected result is Negative. Fact Sheet for Patients:  BoilerBrush.com.cy Fact Sheet for Healthcare Providers: https://pope.com/ This test is not yet approved or cleared by the [place] FDA and has been authorized for detection and/or diagnosis of SARS-CoV-2 by FDA under an Emergency Use Authorization (EUA).  This EUA will remain in effect (meaning this test can be used) for the duration of the COVID-19 declaration under Section 564(b)(1) of the Act, 21 U.S.C. section 360bbb-3(b)(1), unless the authorization is terminated or revoked sooner. Performed at [facility], [address] [place], [place] [address]   Novel Coronavirus, NAA (  hospital order; send-out to ref lab)     Status: None   Collection Time: 12/18/18  9:21 PM  Result Value Ref Range Status   SARS-CoV-2, NAA NOT DETECTED NOT DETECTED Final    Comment: (NOTE) Testing was performed using the cobas(R) SARS-CoV-2 test. This test was developed and its performance characteristics determined by [employer]. This test has not been FDA cleared or approved. This test has been authorized by FDA under an Emergency Use Authorization (EUA). This test is only authorized for the duration of time the declaration that circumstances exist justifying the authorization of the emergency use of in vitro diagnostic tests for detection of SARS-CoV-2 virus and/or diagnosis of COVID-19 infection under section 564(b)(1) of the Act, 21 U.S.C. 001VCB-4(W)(9), unless the authorization is terminated or revoked sooner. When diagnostic testing is negative, the possibility of a false negative result should be considered in the context of a patient's recent exposures and the presence of clinical signs and symptoms consistent with COVID-19. An individual without symptoms  of COVID-19 and who is not shedding SARS-CoV-2 virus would expect to have  a negative (not detected) result in this assay. Performed At: [facility] [address] [place], [place] [id number] Jolene Schimke MD YK:[id number]    Coronavirus Source NASOPHARYNGEAL  Final    Comment: Performed at [facility], [address] [place], [place] [address]    RADIOLOGY:  No results found.  EKG:   Orders placed or performed during the hospital encounter of 12/17/18  . ED EKG  . ED EKG  . EKG 12-Lead  . EKG 12-Lead      Management plans discussed with the patient, family and they are in agreement.  CODE STATUS:     Code Status Orders  (From admission, onward)         Start     Ordered   12/19/18 1233  Full code  Continuous     12/19/18 1232        Code Status History    Date Active Date Inactive Code Status Order ID Comments User Context   12/17/2018 1305 12/19/2018 1232 Full Code [id number]  [name], MD Inpatient      TOTAL TIME TAKING CARE OF THIS PATIENT: 35 minutes.    Altamese Dilling M.D on 12/20/2018 at 1:06 PM  Between 7am to 6pm - Pager - [phone]  After 6pm go to [email or web address] - password [email or web address]  [facility]  Office  [phone]  CC: Primary care physician; Evie Lacks, NP   Note: This dictation was prepared with Dragon dictation along with smaller phrase technology. Any transcriptional errors that result from this process are unintentional.

## 2018-12-20 NOTE — Plan of Care (Signed)
  Problem: Cardiovascular: Goal: Ability to achieve and maintain adequate cardiovascular perfusion will improve Outcome: Progressing Goal: Vascular access site(s) Level 0-1 will be maintained Outcome: Progressing   

## 2018-12-20 NOTE — Progress Notes (Signed)
Patient given discharge instructions. Patient verbalized understanding with no questions or concerns. IV's taken out and tele monitor off. Patient going home in family vehicle.

## 2018-12-20 NOTE — Plan of Care (Signed)
  Problem: Health Behavior/Discharge Planning: Goal: Ability to manage health-related needs will improve Outcome: Adequate for Discharge   Problem: Safety: Goal: Ability to remain free from injury will improve Outcome: Adequate for Discharge   Problem: Education: Goal: Understanding of cardiac disease, CV risk reduction, and recovery process will improve Outcome: Adequate for Discharge Goal: Individualized Educational Video(s) Outcome: Adequate for Discharge   Problem: Activity: Goal: Ability to tolerate increased activity will improve Outcome: Adequate for Discharge   Problem: Cardiac: Goal: Ability to achieve and maintain adequate cardiovascular perfusion will improve Outcome: Adequate for Discharge   Problem: Health Behavior/Discharge Planning: Goal: Ability to safely manage health-related needs after discharge will improve Outcome: Adequate for Discharge   Problem: Education: Goal: Understanding of CV disease, CV risk reduction, and recovery process will improve Outcome: Adequate for Discharge Goal: Individualized Educational Video(s) Outcome: Adequate for Discharge   Problem: Activity: Goal: Ability to return to baseline activity level will improve Outcome: Adequate for Discharge   Problem: Cardiovascular: Goal: Ability to achieve and maintain adequate cardiovascular perfusion will improve Outcome: Adequate for Discharge Goal: Vascular access site(s) Level 0-1 will be maintained Outcome: Adequate for Discharge   Problem: Health Behavior/Discharge Planning: Goal: Ability to safely manage health-related needs after discharge will improve Outcome: Adequate for Discharge

## 2018-12-20 NOTE — Progress Notes (Signed)
Patient refusing to watch heart failure videos tonight. Agreeable to watch videos in the morning.

## 2018-12-23 ENCOUNTER — Telehealth: Payer: Self-pay | Admitting: Cardiovascular Disease

## 2018-12-23 ENCOUNTER — Encounter: Payer: Self-pay | Admitting: Cardiovascular Disease

## 2018-12-23 NOTE — Telephone Encounter (Signed)
Patient contacted regarding discharge from [facility] on 12/20/18.  Patient understands to follow up with provider Gollan on 01/09/19 at 1:20pm at [facility]. Patient understands discharge instructions? yes Patient understands medications and regiment? yes Patient understands to bring all medications to this visit? yes  Patient asked if we could prescribe him something to help him sleep. Advised him he would need to see his PCP for that.   He says Edward [name] will be the one handling his STD paperwork. I advised him it is a fee of about $25 to process and to call our office when ready.

## 2018-12-23 NOTE — Telephone Encounter (Signed)
Attempted to schedule [facility]   No ans no vm   ----- Message -----  From: Laurey Morale, MD  Sent: 12/20/2018 11:28 AM EDT  To: Oneida Arenas  Subject: [facility] office followups                 Edward [name] discharged today, will need followup appt

## 2018-12-23 NOTE — Telephone Encounter (Signed)
Virtual Visit Pre-Appointment Phone Call  "(Name), I am calling you today to discuss your upcoming appointment. We are currently trying to limit exposure to the virus that causes COVID-19 by seeing patients at home rather than in the office."  1. "What is the BEST phone number to call the day of the visit?" - include this in appointment notes  2. Do you have or have access to (through a family member/friend) a smartphone with video capability that we can use for your visit?" a. If yes - list this number in appt notes as cell (if different from BEST phone #) and list the appointment type as a VIDEO visit in appointment notes b. If no - list the appointment type as a PHONE visit in appointment notes  3. Confirm consent - "In the setting of the current Covid19 crisis, you are scheduled for a (phone or video) visit with your provider on (date) at (time).  Just as we do with many in-office visits, in order for you to participate in this visit, we must obtain consent.  If you'd like, I can send this to your mychart (if signed up) or email for you to review.  Otherwise, I can obtain your verbal consent now.  All virtual visits are billed to your insurance company just like a normal visit would be.  By agreeing to a virtual visit, we'd like you to understand that the technology does not allow for your provider to perform an examination, and thus may limit your provider's ability to fully assess your condition. If your provider identifies any concerns that need to be evaluated in person, we will make arrangements to do so.  Finally, though the technology is pretty good, we cannot assure that it will always work on either your or our end, and in the setting of a video visit, we may have to convert it to a phone-only visit.  In either situation, we cannot ensure that we have a secure connection.  Are you willing to proceed?" STAFF: Did the patient verbally acknowledge consent to telehealth visit? Document  YES/NO here: yes   4. Advise patient to be prepared - "Two hours prior to your appointment, go ahead and check your blood pressure, pulse, oxygen saturation, and your weight (if you have the equipment to check those) and write them all down. When your visit starts, your provider will ask you for this information. If you have an Apple Watch or Kardia device, please plan to have heart rate information ready on the day of your appointment. Please have a pen and paper handy nearby the day of the visit as well."  5. Give patient instructions for MyChart download to smartphone OR Doximity/Doxy.me as below if video visit (depending on what platform provider is using)  6. Inform patient they will receive a phone call 15 minutes prior to their appointment time (may be from unknown caller ID) so they should be prepared to answer    TELEPHONE CALL NOTE  Edward [name] has been deemed a candidate for a follow-up tele-health visit to limit community exposure during the Covid-19 pandemic. I spoke with the patient via phone to ensure availability of phone/video source, confirm preferred email & phone number, and discuss instructions and expectations.  I reminded Edward [name] to be prepared with any vital sign and/or heart rhythm information that could potentially be obtained via home monitoring, at the time of his visit. I reminded Edward [name] to expect a phone call prior  to his visit.  Joline Maxcy 12/23/2018 11:24 AM   INSTRUCTIONS FOR DOWNLOADING THE MYCHART APP TO SMARTPHONE  - The patient must first make sure to have activated MyChart and know their login information - If Apple, go to [employer] and type in MyChart in the search bar and download the app. If Android, ask patient to go to [employer] and type in [place] in the search bar and download the app. The app is free but as with any other app downloads, their phone may require them to verify saved payment information or Apple/Android  password.  - The patient will need to then log into the app with their MyChart username and password, and select [facility] as their healthcare provider to link the account. When it is time for your visit, go to the MyChart app, find appointments, and click Begin Video Visit. Be sure to Select Allow for your device to access the Microphone and Camera for your visit. You will then be connected, and your provider will be with you shortly.  **If they have any issues connecting, or need assistance please contact MyChart service desk [phone])**  **If using a computer, in order to ensure the best quality for their visit they will need to use either of the following Internet Browsers: [employer] or Google Chrome**  IF USING DOXIMITY or DOXY.ME - The patient will receive a link just prior to their visit by text.     FULL LENGTH CONSENT FOR TELE-HEALTH VISIT   I hereby voluntarily request, consent and authorize [facility] and its employed or contracted physicians, physician assistants, nurse practitioners or other licensed health care professionals (the Practitioner), to provide me with telemedicine health care services (the Services") as deemed necessary by the treating Practitioner. I acknowledge and consent to receive the Services by the Practitioner via telemedicine. I understand that the telemedicine visit will involve communicating with the Practitioner through live audiovisual communication technology and the disclosure of certain medical information by electronic transmission. I acknowledge that I have been given the opportunity to request an in-person assessment or other available alternative prior to the telemedicine visit and am voluntarily participating in the telemedicine visit.  I understand that I have the right to withhold or withdraw my consent to the use of telemedicine in the course of my care at any time, without affecting my right to future care or treatment,  and that the Practitioner or I may terminate the telemedicine visit at any time. I understand that I have the right to inspect all information obtained and/or recorded in the course of the telemedicine visit and may receive copies of available information for a reasonable fee.  I understand that some of the potential risks of receiving the Services via telemedicine include:   Delay or interruption in medical evaluation due to technological equipment failure or disruption;  Information transmitted may not be sufficient (e.g. poor resolution of images) to allow for appropriate medical decision making by the Practitioner; and/or   In rare instances, security protocols could fail, causing a breach of personal health information.  Furthermore, I acknowledge that it is my responsibility to provide information about my medical history, conditions and care that is complete and accurate to the best of my ability. I acknowledge that Practitioner's advice, recommendations, and/or decision may be based on factors not within their control, such as incomplete or inaccurate data provided by me or distortions of diagnostic images or specimens that may result from electronic transmissions.  I understand that the practice of medicine is not an exact science and that Practitioner makes no warranties or guarantees regarding treatment outcomes. I acknowledge that I will receive a copy of this consent concurrently upon execution via email to the email address I last provided but may also request a printed copy by calling the office of [facility].    I understand that my insurance will be billed for this visit.   I have read or had this consent read to me.  I understand the contents of this consent, which adequately explains the benefits and risks of the Services being provided via telemedicine.   I have been provided ample opportunity to ask questions regarding this consent and the Services and have had my questions  answered to my satisfaction.  I give my informed consent for the services to be provided through the use of telemedicine in my medical care  By participating in this telemedicine visit I agree to the above.

## 2018-12-23 NOTE — Telephone Encounter (Signed)
TCM....  Patient is being discharged   They saw [facility]   They are scheduled to see [facility] for a virtual visit 6/12 at 120 pm   They were seen for cath / elevated Trop  They need to be seen within 1-2 weeks     Patient wants to talk about return to work and restrictions concerns with new meds   Please call

## 2018-12-26 ENCOUNTER — Telehealth: Payer: Self-pay

## 2018-12-26 NOTE — Telephone Encounter (Signed)
Called patient to make him aware there has been a change in Dr. Windell Hummingbird schedule and I was going to move is appointment to a different time if he was agreeable to that. Patient stated that was fine. Moved his appointment time to 3:00pm.   While patient was on the phone he stated he has been out of work for about 5 weeks now. He stated he does have STD through his company. He stated that we need to contact Estrella Deeds (the lady that handles the STD) at [phone].   Made him aware that I would send this to the nurse for review.

## 2019-01-02 NOTE — Telephone Encounter (Signed)
Spoke with patient and reviewed that any short term disability papers can be faxed to our office and that there is a $25 fee. He was wanting to know when he would have to go back to work and reviewed that at his upcoming appointment Dr. Mariah Milling would review over that with him. I told him that I would call the contact person at the company to provide her our fax number. He verbalized understanding with no further questions at this time. When I was trying to see if he would like to enroll for Mychart the phone disconnected and I tried to call back and it went straight to voicemail.

## 2019-01-02 NOTE — Telephone Encounter (Signed)
Spoke with Andrey Campanile at patients employer and she reports that she just emailed him all of the paperwork that he needs to complete with information for him. She states that he has some portion to fill out and then he has a physician part. Reviewed that he just needs to drop off the forms to [place] and we will help get those processed. Instructed her to please call back if I can assist any further.

## 2019-01-06 ENCOUNTER — Other Ambulatory Visit: Payer: Self-pay

## 2019-01-06 ENCOUNTER — Telehealth: Payer: Self-pay | Admitting: Cardiovascular Disease

## 2019-01-06 ENCOUNTER — Emergency Department
Admission: EM | Admit: 2019-01-06 | Discharge: 2019-01-06 | Disposition: A | Discharge status: Home / Self Care | Payer: No Typology Code available for payment source | Attending: Emergency Medicine | Admitting: Emergency Medicine

## 2019-01-06 ENCOUNTER — Emergency Department: Payer: No Typology Code available for payment source

## 2019-01-06 ENCOUNTER — Encounter: Payer: Self-pay | Admitting: *Deleted

## 2019-01-06 DIAGNOSIS — M79672 Pain in left foot: Secondary | ICD-10-CM | POA: Diagnosis present

## 2019-01-06 DIAGNOSIS — F1210 Cannabis abuse, uncomplicated: Secondary | ICD-10-CM | POA: Diagnosis not present

## 2019-01-06 DIAGNOSIS — F1410 Cocaine abuse, uncomplicated: Secondary | ICD-10-CM | POA: Diagnosis not present

## 2019-01-06 DIAGNOSIS — R2242 Localized swelling, mass and lump, left lower limb: Secondary | ICD-10-CM | POA: Insufficient documentation

## 2019-01-06 DIAGNOSIS — I252 Old myocardial infarction: Secondary | ICD-10-CM | POA: Diagnosis not present

## 2019-01-06 DIAGNOSIS — L03116 Cellulitis of left lower limb: Secondary | ICD-10-CM | POA: Insufficient documentation

## 2019-01-06 DIAGNOSIS — Z79899 Other long term (current) drug therapy: Secondary | ICD-10-CM | POA: Insufficient documentation

## 2019-01-06 DIAGNOSIS — Z7982 Long term (current) use of aspirin: Secondary | ICD-10-CM | POA: Diagnosis not present

## 2019-01-06 HISTORY — DX: Acute myocardial infarction, unspecified: I21.9

## 2019-01-06 LAB — BASIC METABOLIC PANEL
Anion gap: 11 (ref 5–15)
BUN: 13 mg/dL (ref 6–20)
CO2: 21 mmol/L — ABNORMAL LOW (ref 22–32)
Calcium: 9.2 mg/dL (ref 8.9–10.3)
Chloride: 104 mmol/L (ref 98–111)
Creatinine, Ser: 1.25 mg/dL — ABNORMAL HIGH (ref 0.61–1.24)
GFR calc Af Amer: >60 mL/min (ref >60)
GFR calc non Af Amer: >60 mL/min (ref >60)
Glucose, Bld: 122 mg/dL — ABNORMAL HIGH (ref 70–99)
Potassium: 3.5 mmol/L (ref 3.5–5.1)
Sodium: 136 mmol/L (ref 135–145)

## 2019-01-06 LAB — CBC WITH DIFFERENTIAL/PLATELET
Abs Immature Granulocytes: 0.07 10*3/uL (ref 0.00–0.07)
Basophils Absolute: 0 10*3/uL (ref 0.0–0.1)
Basophils Relative: 0 %
Eosinophils Absolute: 0 10*3/uL (ref 0.0–0.5)
Eosinophils Relative: 0 %
HCT: 42.7 % (ref 39.0–52.0)
Hemoglobin: 14.7 g/dL (ref 13.0–17.0)
Immature Granulocytes: 1 %
Lymphocytes Relative: 20 %
Lymphs Abs: 2.7 10*3/uL (ref 0.7–4.0)
MCH: 32.7 pg (ref 26.0–34.0)
MCHC: 34.4 g/dL (ref 30.0–36.0)
MCV: 94.9 fL (ref 80.0–100.0)
Monocytes Absolute: 1.3 10*3/uL — ABNORMAL HIGH (ref 0.1–1.0)
Monocytes Relative: 9 %
Neutro Abs: 9.6 10*3/uL — ABNORMAL HIGH (ref 1.7–7.7)
Neutrophils Relative %: 70 %
Platelets: 272 10*3/uL (ref 150–400)
RBC: 4.5 MIL/uL (ref 4.22–5.81)
RDW: 13.4 % (ref 11.5–15.5)
WBC: 13.8 10*3/uL — ABNORMAL HIGH (ref 4.0–10.5)
nRBC: 0 % (ref 0.0–0.2)

## 2019-01-06 MED ORDER — OXYCODONE-ACETAMINOPHEN 5-325 MG PO TABS: 1.0000 | ORAL_TABLET | Freq: Four times a day (QID) | ORAL | 0 refills | Status: DC | PRN

## 2019-01-06 MED ORDER — CEPHALEXIN 500 MG PO CAPS
500.0000 mg | ORAL_CAPSULE | Freq: Once | ORAL | Status: AC
  Administered 2019-01-06: 500 mg via ORAL
  Filled 2019-01-06: qty 1

## 2019-01-06 MED ORDER — CEPHALEXIN 500 MG PO CAPS: 500.0000 mg | ORAL_CAPSULE | Freq: Three times a day (TID) | ORAL | 0 refills | Status: DC

## 2019-01-06 MED ORDER — SULFAMETHOXAZOLE-TRIMETHOPRIM 800-160 MG PO TABS: 1.0000 | ORAL_TABLET | Freq: Two times a day (BID) | ORAL | 0 refills | Status: DC

## 2019-01-06 MED ORDER — SULFAMETHOXAZOLE-TRIMETHOPRIM 800-160 MG PO TABS
1.0000 | ORAL_TABLET | Freq: Once | ORAL | Status: AC
  Administered 2019-01-06: 1 via ORAL
  Filled 2019-01-06: qty 1

## 2019-01-06 MED ORDER — ONDANSETRON 4 MG PO TBDP: 4.0000 mg | ORAL_TABLET | Freq: Three times a day (TID) | ORAL | 0 refills | Status: DC | PRN

## 2019-01-06 MED ORDER — OXYCODONE-ACETAMINOPHEN 5-325 MG PO TABS
1.0000 | ORAL_TABLET | Freq: Once | ORAL | Status: AC
  Administered 2019-01-06: 1 via ORAL
  Filled 2019-01-06: qty 1

## 2019-01-06 MED ORDER — NAPROXEN 500 MG PO TABS: 500.0000 mg | ORAL_TABLET | Freq: Two times a day (BID) | ORAL | 0 refills | Status: DC

## 2019-01-06 MED ORDER — NAPROXEN 500 MG PO TABS
500.0000 mg | ORAL_TABLET | Freq: Once | ORAL | Status: AC
  Administered 2019-01-06: 500 mg via ORAL
  Filled 2019-01-06: qty 1

## 2019-01-06 NOTE — ED Notes (Signed)
Sent rainbow to lab. Gave patient urine cup with bag.

## 2019-01-06 NOTE — ED Notes (Addendum)
Pt c/o left foot swollen and inability to walk X3 days (however pt walked to the room from triage) - pt thinks he has a "blood clot" - denies [facility] or chest pain - pt reports that the left foot has increased in size since he arrived to the ED

## 2019-01-06 NOTE — Telephone Encounter (Signed)
Spoke with patient. States he was on his way to the Emergency room. Says he just spoke with his PCP who advised this. He said his foot is very swollen. He is also short of breath of the phone. He is on his way there now.

## 2019-01-06 NOTE — ED Triage Notes (Signed)
PT to ED with left sided foot swelling and calf pain. Significant swelling noted to left foot at this time. Pt reporting he is not able to ambulate on foot at this time. Swelling reported since Saturday without injury.

## 2019-01-06 NOTE — ED Provider Notes (Signed)
[facility] Emergency Department Provider Note  ____________________________________________  Time seen: Approximately 7:32 PM  I have reviewed the triage vital signs and the nursing notes.   HISTORY  Chief Complaint Leg Swelling    HPI Edward [name] is a 52 y.o. male with a history of alcohol abuse and CAD status post MI who complains of pain and swelling of the left foot, gradual onset over the past 3 to 4 days.  Nonradiating, worse with walking, no alleviating factors.  Moderate to severe in intensity.  No chest pain or shortness of breath.  No fevers or chills or body aches.  Reports he was recently bitten in that foot by ants.   Denies any recent travel trauma hospitalizations or surgeries.  No history of DVT.  Past Medical History:  Diagnosis Date  . Alcohol abuse   . Colon polyp 2016  . Herpes   . MI (myocardial infarction) (HCC)   . Polysubstance abuse (HCC)   . Vitamin B 12 deficiency      Patient Active Problem List   Diagnosis Date Noted  . Chest pain 12/17/2018  . Leg pain 08/07/2018  . Neuropathy 06/23/2018  . Varicose veins of both lower extremities 06/23/2018  . Bilateral cold feet 06/23/2018     Past Surgical History:  Procedure Laterality Date  . ANAL FISTULOTOMY  04/03/2016   Procedure: ANAL FISTULOTOMY;  Surgeon: Kieth Brightly, MD;  Location: [facility] ORS;  Service: General;;  . BLADDER SURGERY    . COLONOSCOPY  07/06/2015   Dr Shelle Iron  . KNEE ARTHROSCOPY Left   . LEFT HEART CATH AND CORONARY ANGIOGRAPHY N/A 12/19/2018   Procedure: LEFT HEART CATH AND CORONARY ANGIOGRAPHY;  Surgeon: Antonieta Iba, MD;  Location: [facility] INVASIVE CV LAB;  Service: Cardiovascular;  Laterality: N/A;  . UPPER GASTROINTESTINAL ENDOSCOPY  07/06/2015     Prior to Admission medications   Medication Sig Start Date End Date Taking? Authorizing Provider  aspirin 81 MG chewable tablet Chew 1 tablet (81 mg total) by mouth daily. 12/21/18    Altamese Dilling, MD  busPIRone (BUSPAR) 10 MG tablet Take 10 mg by mouth at bedtime as needed for anxiety. 09/17/18   [provider]  cephALEXin (KEFLEX) 500 MG capsule Take 1 capsule (500 mg total) by mouth 3 (three) times daily. 01/06/19   Sharman Cheek, MD  clonazePAM (KLONOPIN) 0.5 MG tablet Take 0.5 mg by mouth 2 (two) times a day. 09/03/18   [provider]  co-enzyme Q-10 30 MG capsule Take 30 mg by mouth daily.    [provider]  cyclobenzaprine (FLEXERIL) 10 MG tablet Take 5-10 mg by mouth at bedtime as needed for muscle pain. 07/17/18   [provider]  furosemide (LASIX) 40 MG tablet Take 1 tablet (40 mg total) by mouth daily. 12/20/18   Altamese Dilling, MD  gabapentin (NEURONTIN) 300 MG capsule Take 300 mg by mouth daily. 11/20/18   [provider]  meloxicam (MOBIC) 15 MG tablet Take 15 mg by mouth daily. 12/08/18   [provider]  naproxen (NAPROSYN) 500 MG tablet Take 1 tablet (500 mg total) by mouth 2 (two) times daily with a meal. 01/06/19   Sharman Cheek, MD  Omega-3 Fatty Acids (FISH OIL) 1000 MG CPDR Take 1,000 mg by mouth daily.     [provider]  ondansetron (ZOFRAN ODT) 4 MG disintegrating tablet Take 1 tablet (4 mg total) by mouth every 8 (eight) hours as needed for nausea or  vomiting. 01/06/19   Sharman CheekStafford, Thuan Tippett, MD  oxyCODONE-acetaminophen (PERCOCET) 5-325 MG tablet Take 1 tablet by mouth every 6 (six) hours as needed for severe pain. 01/06/19 01/06/20  Sharman CheekStafford, Johanne Mcglade, MD  sacubitril-valsartan (ENTRESTO) 24-26 MG Take 1 tablet by mouth 2 (two) times daily. 12/20/18   Altamese DillingVachhani, Vaibhavkumar, MD  spironolactone (ALDACTONE) 25 MG tablet Take 0.5 tablets (12.5 mg total) by mouth daily. 12/21/18   Altamese DillingVachhani, Vaibhavkumar, MD  sulfamethoxazole-trimethoprim (BACTRIM DS) 800-160 MG tablet Take 1 tablet by mouth 2 (two) times daily. 01/06/19   Sharman CheekStafford, Krissy Orebaugh, MD  traZODone (DESYREL) 50 MG tablet Take 50-100  mg by mouth at bedtime. 09/19/18   [provider]  vitamin B-12 (CYANOCOBALAMIN) 500 MCG tablet Take 500 mcg by mouth daily.    [provider]  Zinc Sulfate (ZINC-220 PO) Take 220 mg by mouth daily.     [provider]     Allergies Codeine   Family History  Problem Relation Age of Onset  . Colon cancer Maternal Grandmother   . Prostate cancer Paternal Uncle   . Leukemia Paternal Uncle   . Heart failure Father   . CAD Sister     Social History Social History   Tobacco Use  . Smoking status: Never Smoker  . Smokeless tobacco: Never Used  Substance Use Topics  . Alcohol use: Yes    Comment: moderate use- does not like to quantify  . Drug use: Yes    Types: Marijuana, Cocaine    Review of Systems  Constitutional:   No fever or chills.  ENT:   No sore throat. No rhinorrhea. Cardiovascular:   No chest pain or syncope. Respiratory:   No dyspnea or cough. Gastrointestinal:   Negative for abdominal pain, vomiting and diarrhea.  Musculoskeletal:   Left foot pain and swelling as above All other systems reviewed and are negative except as documented above in ROS and HPI.  ____________________________________________   PHYSICAL EXAM:  VITAL SIGNS: ED Triage Vitals  Enc Vitals Group     BP 01/06/19 1620 129/82     Pulse Rate 01/06/19 1620 91     Resp 01/06/19 1620 19     Temp 01/06/19 1620 100 F (37.8 C)     Temp Source 01/06/19 1620 Oral     SpO2 01/06/19 1620 98 %     Weight 01/06/19 1620 195 lb (88.5 kg)     Height 01/06/19 1620 6\' 3"  (1.905 m)     Head Circumference --      Peak Flow --      Pain Score 01/06/19 1630 9     Pain Loc --      Pain Edu? --      Excl. in GC? --     Vital signs reviewed, nursing assessments reviewed.   Constitutional:   Alert and oriented. Non-toxic appearance. Eyes:   Conjunctivae are normal. EOMI. PERRL. ENT      Head:   Normocephalic and atraumatic.         Neck:   No meningismus. Full  ROM. Hematological/Lymphatic/Immunilogical:   No cervical lymphadenopathy. Cardiovascular:   RRR. Symmetric bilateral radial and DP pulses.  No murmurs. Cap refill less than 2 seconds. Respiratory:   Normal respiratory effort without tachypnea/retractions. Breath sounds are clear and equal bilaterally. No wheezes/rales/rhonchi. Gastrointestinal:   Soft and nontender. Non distended. There is no CVA tenderness.  No rebound, rigidity, or guarding. Genitourinary:   deferred Musculoskeletal:   Normal range of motion in all extremities.  No joint effusions.  There is warmth erythema tenderness and swelling of the left lateral foot.  No bony tenderness, fluctuance, or crepitus.  No lymphangitis. Neurologic:   Normal speech and language.  Motor grossly intact. No acute focal neurologic deficits are appreciated.  Skin:    Skin is warm, dry and intact.  Inflammatory changes of left foot as above, otherwise no rash petechia purpura or bullae.  No ulceration.  ____________________________________________    LABS (pertinent positives/negatives) (all labs ordered are listed, but only abnormal results are displayed) Labs Reviewed  CBC WITH DIFFERENTIAL/PLATELET - Abnormal; Notable for the following components:      Result Value   WBC 13.8 (*)    Neutro Abs 9.6 (*)    Monocytes Absolute 1.3 (*)    All other components within normal limits  BASIC METABOLIC PANEL - Abnormal; Notable for the following components:   CO2 21 (*)    Glucose, Bld 122 (*)    Creatinine, Ser 1.25 (*)    All other components within normal limits   ____________________________________________   EKG    ____________________________________________    RADIOLOGY  US Venous Img Lower Unilateral Left  Result Date: 01/06/2019 CLINICAL DATA:  Left lower extremity pain and edema. EXAM: LEFT LOWER EXTREMITY VENOUS DOPPLER ULTRASOUND TECHNIQUE: Gray-scale sonography with graded compression, as well as color Doppler and duplex  ultrasound were performed to evaluate the lower extremity deep venous systems from the level of the common femoral vein and including the common femoral, femoral, profunda femoral, popliteal and calf veins including the posterior tibial, peroneal and gastrocnemius veins when visible. The superficial great saphenous vein was also interrogated. Spectral Doppler was utilized to evaluate flow at rest and with distal augmentation maneuvers in the common femoral, femoral and popliteal veins. COMPARISON:  None. FINDINGS: Contralateral Common Femoral Vein: Respiratory phasicity is normal and symmetric with the symptomatic side. No evidence of thrombus. Normal compressibility. Common Femoral Vein: No evidence of thrombus. Normal compressibility, respiratory phasicity and response to augmentation. Saphenofemoral Junction: No evidence of thrombus. Normal compressibility and flow on color Doppler imaging. Profunda Femoral Vein: No evidence of thrombus. Normal compressibility and flow on color Doppler imaging. Femoral Vein: No evidence of thrombus. Normal compressibility, respiratory phasicity and response to augmentation. Popliteal Vein: No evidence of thrombus. Normal compressibility, respiratory phasicity and response to augmentation. Calf Veins: No evidence of thrombus. Normal compressibility and flow on color Doppler imaging. Superficial Great Saphenous Vein: No evidence of thrombus. Normal compressibility. Venous Reflux:  None. Other Findings: No evidence of superficial thrombophlebitis or abnormal fluid collection. IMPRESSION: No evidence of left lower extremity deep venous thrombosis. Electronically Signed   By: Aletta Edouard M.D.   On: 01/06/2019 17:41    ____________________________________________   PROCEDURES Procedures  ____________________________________________    CLINICAL IMPRESSION / ASSESSMENT AND PLAN / ED COURSE  Medications ordered in the ED: Medications  sulfamethoxazole-trimethoprim  (BACTRIM DS) 800-160 MG per tablet 1 tablet (1 tablet Oral Given 01/06/19 1923)  cephALEXin (KEFLEX) capsule 500 mg (500 mg Oral Given 01/06/19 1922)  oxyCODONE-acetaminophen (PERCOCET/ROXICET) 5-325 MG per tablet 1 tablet (1 tablet Oral Given 01/06/19 1923)  naproxen (NAPROSYN) tablet 500 mg (500 mg Oral Given 01/06/19 1923)    Pertinent labs & imaging results that were available during my care of the patient were reviewed by me and considered in my medical decision making (see chart for details).  Edward [name] was evaluated in Emergency Department on 01/06/2019 for the symptoms described in the history of  present illness. He was evaluated in the context of the global COVID-19 pandemic, which necessitated consideration that the patient might be at risk for infection with the SARS-CoV-2 virus that causes COVID-19. Institutional protocols and algorithms that pertain to the evaluation of patients at risk for COVID-19 are in a state of rapid change based on information released by regulatory bodies including the CDC and federal and state organizations. These policies and algorithms were followed during the patient's care in the ED.   Patient presents with left foot pain and swelling.  Borderline elevated body temperature, otherwise normal vital signs.  Exam consistent with a simple cellulitis of the left foot without evidence of osteomyelitis necrotizing fasciitis or abscess.  Labs show a mild leukocytosis as well consistent with cellulitis.  Ultrasound of the left lower extremity is negative for DVT.  The patient on Keflex and Bactrim.  Pain control with NSAIDs and a very limited course of Percocet.  I reviewed his substance abuse history with him which predominantly entails alcohol abuse, denies a history of opioid abuse or dependence or recovery from such..  Controlled substance reporting system reviewed, and he does not appear to be engaged in diversion or substance abuse currently.       ____________________________________________   FINAL CLINICAL IMPRESSION(S) / ED DIAGNOSES    Final diagnoses:  Cellulitis of left foot     ED Discharge Orders         Ordered    cephALEXin (KEFLEX) 500 MG capsule  3 times daily     01/06/19 1932    sulfamethoxazole-trimethoprim (BACTRIM DS) 800-160 MG tablet  2 times daily     01/06/19 1932    ondansetron (ZOFRAN ODT) 4 MG disintegrating tablet  Every 8 hours PRN     01/06/19 1932    naproxen (NAPROSYN) 500 MG tablet  2 times daily with meals     01/06/19 1932    oxyCODONE-acetaminophen (PERCOCET) 5-325 MG tablet  Every 6 hours PRN     01/06/19 1932          Portions of this note were generated with dragon dictation software. Dictation errors may occur despite best attempts at proofreading.   Sharman CheekStafford, Camauri Craton, MD 01/06/19 [phone]

## 2019-01-06 NOTE — Telephone Encounter (Signed)
Pt c/o swelling: STAT is pt has developed SOB within 24 hours  1) How much weight have you gained and in what time span? no  2) If swelling, where is the swelling located? Left leg and foot, states he was bitten by red ants, and not sure if that is causing the swelling or not  3) Are you currently taking a fluid pill? yes  4) Are you currently SOB? Last several days, yes.   5) Do you have a log of your daily weights (if so, list)? no  6) Have you gained 3 pounds in a day or 5 pounds in a week? no  7) Have you traveled recently? No  Please call to discuss

## 2019-01-07 ENCOUNTER — Telehealth: Payer: Self-pay | Admitting: Cardiovascular Disease

## 2019-01-07 NOTE — Telephone Encounter (Signed)
Recieved request from : patient   Scanned to roi   Forwarded to [employer] for processing   Patient made aware that forms a sent to [employer] and then Dr. Rockey Situ will review and sign   Patient made aware that there is a charge for these forms to be completed and the processing time is 10-14 business days   Patient states that is a "crock of shit" several times while exiting the office

## 2019-01-08 DIAGNOSIS — F1910 Other psychoactive substance abuse, uncomplicated: Secondary | ICD-10-CM | POA: Insufficient documentation

## 2019-01-08 DIAGNOSIS — I420 Dilated cardiomyopathy: Secondary | ICD-10-CM | POA: Insufficient documentation

## 2019-01-08 DIAGNOSIS — F419 Anxiety disorder, unspecified: Secondary | ICD-10-CM | POA: Insufficient documentation

## 2019-01-08 NOTE — Progress Notes (Signed)
Virtual Visit via Video Note   This visit type was conducted due to national recommendations for restrictions regarding the COVID-19 Pandemic (e.g. social distancing) in an effort to limit this patient's exposure and mitigate transmission in our community.  Due to his co-morbid illnesses, this patient is at least at moderate risk for complications without adequate follow up.  This format is felt to be most appropriate for this patient at this time.  All issues noted in this document were discussed and addressed.  A limited physical exam was performed with this format.  Please refer to the patient's chart for his consent to telehealth for [facility].   I connected with  Edward [name] on 01/09/19 by a video enabled telemedicine application and verified that I am speaking with the correct person using two identifiers. I discussed the limitations of evaluation and management by telemedicine. The patient expressed understanding and agreed to proceed.   Evaluation Performed:  Follow-up visit  Date:  01/09/2019   ID:  Edward [name], DOB 03/16/1966, MRN [id number]  Patient Location:  [address] [place] [address]   Provider location[facility]   [facility] [place] office  PCP:  Evie LacksPointer, Elizabeth, NP  Cardiologist:  Fonnie MuGollan, [facility]   Chief Complaint:  cellulitis   History of Present Illness:    Edward [name] is a 53 y.o. male who presents via audio/video conferencing for a telehealth visit today.   The patient does not symptoms concerning for COVID-19 infection (fever, chills, cough, or new SHORTNESS OF BREATH).   Patient has a past medical history of alcohol abuse,  cocaine marijuana,  anxiety/panic attacks,   seen in the hospital May 2020 for shortness of breath, elevated troponin Echocardiogram confirming ejection fraction 20% Who presents to establish care in the office, follow-up of his cardiomyopathy  Fire ants x3 bites on foot Went to the ER,  WBC 13.8 Cellulitis left foot, fever 100 On bactrim and keflex Can stand now  Still with some SOB on exertion, Tolerating current meds Drank some wine the other night Stopped cocaine   Recently in the hospital 11/2018 Catheterization, nonischemic Dilated LV with severely reduced ejection fraction 20% Etiology unclear, denies excessive alcohol  Entresto 24/26 mg p.o. twice daily Lasix 40 mg daily , spironolactone 12.5 daily -Given recent cocaine positive will delay on start of low-dose carvedilol  Polysubstance abuse/cocaine/marijuana/alcohol Cessation recommended in light of above Recent cocaine abuse, recommended alcohol cessation  Panic attacks/anxiety  history of anxiety Possibly exacerbated by polysubstance abuse, well as cardiomyopathy Previously on Klonopin, slept better  BuSpar did not seem to work for him Recommend psychiatry outpatient follow-up  Prior CV studies:   The following studies were reviewed today:   Past Medical History:  Diagnosis Date  . Alcohol abuse   . Colon polyp 2016  . Herpes   . MI (myocardial infarction) (HCC)   . Polysubstance abuse (HCC)   . Vitamin B 12 deficiency    Past Surgical History:  Procedure Laterality Date  . ANAL FISTULOTOMY  04/03/2016   Procedure: ANAL FISTULOTOMY;  Surgeon: Kieth BrightlySeeplaputhur G Sankar, MD;  Location: [facility] ORS;  Service: General;;  . BLADDER SURGERY    . COLONOSCOPY  07/06/2015   Dr Shelle Ironein  . KNEE ARTHROSCOPY Left   . LEFT HEART CATH AND CORONARY ANGIOGRAPHY N/A 12/19/2018   Procedure: LEFT HEART CATH AND CORONARY ANGIOGRAPHY;  Surgeon: Antonieta IbaGollan, Timothy J, MD;  Location: [facility] INVASIVE CV LAB;  Service:  Cardiovascular;  Laterality: N/A;  . UPPER GASTROINTESTINAL ENDOSCOPY  07/06/2015     Current Meds  Medication Sig  . aspirin 81 MG chewable tablet Chew 1 tablet (81 mg total) by mouth daily.  . busPIRone (BUSPAR) 10 MG tablet Take 10 mg by mouth at bedtime as needed for anxiety.  . cephALEXin (KEFLEX) 500  MG capsule Take 1 capsule (500 mg total) by mouth 3 (three) times daily.  Marland Kitchen co-enzyme Q-10 30 MG capsule Take 30 mg by mouth daily.  . furosemide (LASIX) 40 MG tablet Take 1 tablet (40 mg total) by mouth daily.  . naproxen (NAPROSYN) 500 MG tablet Take 1 tablet (500 mg total) by mouth 2 (two) times daily with a meal.  . Omega-3 Fatty Acids (FISH OIL) 1000 MG CPDR Take 1,000 mg by mouth daily.   . ondansetron (ZOFRAN ODT) 4 MG disintegrating tablet Take 1 tablet (4 mg total) by mouth every 8 (eight) hours as needed for nausea or vomiting.  Marland Kitchen oxyCODONE-acetaminophen (PERCOCET) 5-325 MG tablet Take 1 tablet by mouth every 6 (six) hours as needed for severe pain.  . sacubitril-valsartan (ENTRESTO) 24-26 MG Take 1 tablet by mouth 2 (two) times daily.  Marland Kitchen spironolactone (ALDACTONE) 25 MG tablet Take 0.5 tablets (12.5 mg total) by mouth daily.  Marland Kitchen sulfamethoxazole-trimethoprim (BACTRIM DS) 800-160 MG tablet Take 1 tablet by mouth 2 (two) times daily.  . traZODone (DESYREL) 50 MG tablet Take 50 mg by mouth 4 (four) times daily. Patient takes 4 tablets at bedtime.  . vitamin B-12 (CYANOCOBALAMIN) 500 MCG tablet Take 500 mcg by mouth daily.  . Zinc Sulfate (ZINC-220 PO) Take 220 mg by mouth daily.      Allergies:   Codeine   Social History   Tobacco Use  . Smoking status: Never Smoker  . Smokeless tobacco: Never Used  Substance Use Topics  . Alcohol use: Yes    Comment: moderate use- does not like to quantify  . Drug use: Yes    Types: Marijuana, Cocaine     Current Outpatient Medications on File Prior to Visit  Medication Sig Dispense Refill  . aspirin 81 MG chewable tablet Chew 1 tablet (81 mg total) by mouth daily. 30 tablet 0  . busPIRone (BUSPAR) 10 MG tablet Take 10 mg by mouth at bedtime as needed for anxiety.    . cephALEXin (KEFLEX) 500 MG capsule Take 1 capsule (500 mg total) by mouth 3 (three) times daily. 21 capsule 0  . co-enzyme Q-10 30 MG capsule Take 30 mg by mouth daily.    .  furosemide (LASIX) 40 MG tablet Take 1 tablet (40 mg total) by mouth daily. 30 tablet 0  . naproxen (NAPROSYN) 500 MG tablet Take 1 tablet (500 mg total) by mouth 2 (two) times daily with a meal. 20 tablet 0  . Omega-3 Fatty Acids (FISH OIL) 1000 MG CPDR Take 1,000 mg by mouth daily.     . ondansetron (ZOFRAN ODT) 4 MG disintegrating tablet Take 1 tablet (4 mg total) by mouth every 8 (eight) hours as needed for nausea or vomiting. 20 tablet 0  . oxyCODONE-acetaminophen (PERCOCET) 5-325 MG tablet Take 1 tablet by mouth every 6 (six) hours as needed for severe pain. 8 tablet 0  . sacubitril-valsartan (ENTRESTO) 24-26 MG Take 1 tablet by mouth 2 (two) times daily. 60 tablet 0  . spironolactone (ALDACTONE) 25 MG tablet Take 0.5 tablets (12.5 mg total) by mouth daily. 30 tablet 0  . sulfamethoxazole-trimethoprim (BACTRIM DS) 800-160  MG tablet Take 1 tablet by mouth 2 (two) times daily. 14 tablet 0  . traZODone (DESYREL) 50 MG tablet Take 50 mg by mouth 4 (four) times daily. Patient takes 4 tablets at bedtime.    . vitamin B-12 (CYANOCOBALAMIN) 500 MCG tablet Take 500 mcg by mouth daily.    . Zinc Sulfate (ZINC-220 PO) Take 220 mg by mouth daily.      No current facility-administered medications on file prior to visit.      Family Hx: The patient's family history includes CAD in his sister; Colon cancer in his maternal grandmother; Heart failure in his father; Leukemia in his paternal uncle; Prostate cancer in his paternal uncle.  ROS:   Please see the history of present illness.    Review of Systems  Constitutional: Negative.   HENT: Negative.   Respiratory: Negative.   Cardiovascular: Negative.   Gastrointestinal: Negative.   Musculoskeletal: Negative.   Neurological: Negative.   Psychiatric/Behavioral: Negative.   All other systems reviewed and are negative.    Labs/Other Tests and Data Reviewed:    Recent Labs: 01/06/2019: BUN 13; Creatinine, Ser 1.25; Hemoglobin 14.7; Platelets 272;  Potassium 3.5; Sodium 136   Recent Lipid Panel Lab Results  Component Value Date/Time   CHOL 174 12/17/2018 02:44 PM   TRIG 457 (H) 12/17/2018 02:44 PM   HDL 45 12/17/2018 02:44 PM   CHOLHDL 3.9 12/17/2018 02:44 PM   LDLCALC UNABLE TO CALCULATE IF TRIGLYCERIDE OVER 400 mg/dL 23/95/3202 33:43 PM   LDLDIRECT 72.0 12/17/2018 02:44 PM    Wt Readings from Last 3 Encounters:  01/09/19 197 lb (89.4 kg)  01/06/19 195 lb (88.5 kg)  12/19/18 193 lb (87.5 kg)     Exam:    Vital Signs: Vital signs may also be detailed in the HPI BP 101/69   Pulse 89   Ht 6\' 3"  (1.905 m)   Wt 197 lb (89.4 kg)   BMI 24.62 kg/m   Wt Readings from Last 3 Encounters:  01/09/19 197 lb (89.4 kg)  01/06/19 195 lb (88.5 kg)  12/19/18 193 lb (87.5 kg)   Temp Readings from Last 3 Encounters:  01/06/19 100 F (37.8 C) (Oral)  12/20/18 98.3 F (36.8 C) (Oral)  12/08/18 98.5 F (36.9 C) (Oral)   BP Readings from Last 3 Encounters:  01/09/19 101/69  01/06/19 131/84  12/20/18 138/89   Pulse Readings from Last 3 Encounters:  01/09/19 89  01/06/19 73  12/20/18 79     Well nourished, well developed male in no acute distress. Constitutional:  oriented to person, place, and time. No distress.    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 -He is short of breath with exertion, suspect will take him some time to recover We will see him again in 2 months time He will call us before  then, in the next week or so,  if there is room on his blood pressure to advance his Entresto  Polysubstance abuse (  HCC)  Needs for smoking/cocaine and ETOH cessation discussed  Anxiety -  Recommend he try to relax, elevate the foot which has cellulitis Continue antibiotics as below  Cellulitis On ABX, bactrim, keflex Feels the foot swelling is getting better   COVID-19 Education: The signs and symptoms of COVID-19 were discussed with the patient and how to seek care for testing (follow up with PCP or arrange E-visit).  The importance of social distancing was discussed today.  Patient Risk:   After full review of this patients clinical status, I feel that they are at least moderate risk at this time.  Time:   Today, I have spent 25 minutes with the patient with telehealth technology discussing the cardiac and medical problems/diagnoses detailed above   10 min spent reviewing the chart prior to patient visit today   Medication Adjustments/Labs and Tests Ordered: Current medicines are reviewed at length with the patient today.  Concerns regarding medicines are outlined above.   Tests Ordered: No tests ordered   Medication Changes: No changes made   Disposition: Follow-up in 2 months   Signed, Julien Nordmannimothy Gollan, MD  01/09/2019 3:30 PM    [facility] [place] [facility] [address], [place], [place] [address]

## 2019-01-09 ENCOUNTER — Other Ambulatory Visit: Payer: Self-pay

## 2019-01-09 ENCOUNTER — Telehealth (INDEPENDENT_AMBULATORY_CARE_PROVIDER_SITE_OTHER): Payer: No Typology Code available for payment source | Admitting: Cardiovascular Disease

## 2019-01-09 DIAGNOSIS — L03119 Cellulitis of unspecified part of limb: Secondary | ICD-10-CM

## 2019-01-09 DIAGNOSIS — F1910 Other psychoactive substance abuse, uncomplicated: Secondary | ICD-10-CM

## 2019-01-09 DIAGNOSIS — I420 Dilated cardiomyopathy: Secondary | ICD-10-CM | POA: Diagnosis not present

## 2019-01-09 DIAGNOSIS — F419 Anxiety disorder, unspecified: Secondary | ICD-10-CM

## 2019-01-09 MED ORDER — SPIRONOLACTONE 25 MG PO TABS: 12.5000 mg | ORAL_TABLET | Freq: Every day | ORAL | 3 refills | Status: DC

## 2019-01-09 MED ORDER — FUROSEMIDE 40 MG PO TABS: 40.0000 mg | ORAL_TABLET | Freq: Every day | ORAL | 3 refills | Status: DC

## 2019-01-09 MED ORDER — SACUBITRIL-VALSARTAN 24-26 MG PO TABS: 1.0000 | ORAL_TABLET | Freq: Two times a day (BID) | ORAL | 0 refills | Status: DC

## 2019-01-09 NOTE — Patient Instructions (Addendum)
Medication Instructions:  Your physician has recommended you make the following change in your medication:  1. Entresto coupon applied for free 30 days.  There is a $10 copay card for a 90 day supply. Once you get the first one filled we can then send in another prescription for the 90 day supply to allow you to get it at cheaper price. I will mail this to you for your next refills.   If you need a refill on your cardiac medications before your next appointment, please call your pharmacy.    Lab work: No new labs needed   If you have labs (blood work) drawn today and your tests are completely normal, you will receive your results only by: Marland Kitchen MyChart Message (if you have MyChart) OR . A paper copy in the mail If you have any lab test that is abnormal or we need to change your treatment, we will call you to review the results.   Testing/Procedures: No new testing needed   Follow-Up: At [facility], you and your health needs are our priority.  As part of our continuing mission to provide you with exceptional heart care, we have created designated Provider Care Teams.  These Care Teams include your primary Cardiologist (physician) and Advanced Practice Providers (APPs -  Physician Assistants and Nurse Practitioners) who all work together to provide you with the care you need, when you need it.  . You will need a follow up appointment in 2 months  . Providers on your designated Care Team:   . Murray Hodgkins, NP . Christell Faith, PA-C . Marrianne Mood, PA-C  Any Other Special Instructions Will Be Listed Below (If Applicable).  For educational health videos Log in to : [email or web address] Or : [email or web address], password : [email or web address]

## 2019-01-14 NOTE — Telephone Encounter (Signed)
Forms placed on Dr. Gwenyth Ober desk for him to complete for patient [employer] forms.

## 2019-01-14 NOTE — Telephone Encounter (Signed)
RECEIVED FORMS FOR PHYSICIAN TO COMPLETE, PLACED IN MA BOX/SG

## 2019-01-15 ENCOUNTER — Telehealth: Payer: Self-pay

## 2019-01-15 NOTE — Telephone Encounter (Signed)
Prior Authorization sent for Entresto 24-26mg  to [email or web address] [id number] Awaiting approval.

## 2019-01-16 NOTE — Telephone Encounter (Signed)
Pt made aware that we are currently awaiting PA response at this time can normally take up to 72 hrs for response. He will contact office Monday to check on status.

## 2019-01-16 NOTE — Telephone Encounter (Signed)
Patient calling for update on medication approval

## 2019-01-20 NOTE — Telephone Encounter (Signed)
Request Reference Number: [id number].  ENTRESTO TAB 24-26MG  is approved through 01/15/2020.  For further questions, call [phone]

## 2019-01-20 NOTE — Telephone Encounter (Signed)
Sent completed forms to [employer] via interoffice. 

## 2019-01-22 NOTE — Telephone Encounter (Signed)
Patient calling to check the status of forms Was informed they are en route back to [employer] to be completed Stated he would need them faxed to [phone]

## 2019-01-23 ENCOUNTER — Telehealth: Payer: Self-pay | Admitting: Cardiovascular Disease

## 2019-01-23 NOTE — Telephone Encounter (Signed)
Spoke with patient. He is trying to figure out what to do with his foot. States he had cellulitis and June 15th it seemed it was healed. The past day it started to hurt again.  He went to his PCP first think this morning and they said they would call him later to see him this afternoon. However, PCP never called and they closed at 1:30 pm.  He was told his only option was the ED. Advised patient he could go to an urgent care as well. However, patient says his insurance copay is $300.  I offered my support and apologized that we are not able to prescribe the antibiotics and he needs to be seen for this first. Patient started to cry over the phone and said he knew that was probably the case and understood. He is going to go on to the ED for treatment.

## 2019-01-23 NOTE — Telephone Encounter (Signed)
Patient calling  Wanted to make Dr Rockey Situ / office aware that his foot infection is back with a lot of swelling He is working with PCP to get in so he can get antibiotics Since his heart condition can not begin treatment until after this infection is gone he feels it will be a while before he can return to work Would like to discuss Please call

## 2019-02-04 NOTE — Telephone Encounter (Signed)
Edward [name] from [employer] they want to go over history regarding cardiac issues the forms completed vs what patient is stating does not match Informed them we may not be able to discuss   Please call [phone]

## 2019-02-05 ENCOUNTER — Emergency Department
Admission: EM | Admit: 2019-02-05 | Discharge: 2019-02-05 | Disposition: A | Discharge status: Home / Self Care | Payer: No Typology Code available for payment source | Attending: Emergency Medicine | Admitting: Emergency Medicine

## 2019-02-05 ENCOUNTER — Emergency Department: Payer: No Typology Code available for payment source

## 2019-02-05 ENCOUNTER — Telehealth: Payer: Self-pay | Admitting: Cardiovascular Disease

## 2019-02-05 ENCOUNTER — Encounter: Payer: Self-pay | Admitting: Emergency Medicine

## 2019-02-05 ENCOUNTER — Other Ambulatory Visit: Payer: Self-pay

## 2019-02-05 DIAGNOSIS — M79672 Pain in left foot: Secondary | ICD-10-CM | POA: Diagnosis present

## 2019-02-05 DIAGNOSIS — Z79899 Other long term (current) drug therapy: Secondary | ICD-10-CM | POA: Diagnosis not present

## 2019-02-05 DIAGNOSIS — M10072 Idiopathic gout, left ankle and foot: Secondary | ICD-10-CM | POA: Insufficient documentation

## 2019-02-05 DIAGNOSIS — Z7982 Long term (current) use of aspirin: Secondary | ICD-10-CM | POA: Insufficient documentation

## 2019-02-05 LAB — CBC WITH DIFFERENTIAL/PLATELET
Abs Immature Granulocytes: 0.05 10*3/uL (ref 0.00–0.07)
Basophils Absolute: 0.1 10*3/uL (ref 0.0–0.1)
Basophils Relative: 1 %
Eosinophils Absolute: 0.1 10*3/uL (ref 0.0–0.5)
Eosinophils Relative: 1 %
HCT: 39.8 % (ref 39.0–52.0)
Hemoglobin: 13.7 g/dL (ref 13.0–17.0)
Immature Granulocytes: 1 %
Lymphocytes Relative: 25 %
Lymphs Abs: 2.5 10*3/uL (ref 0.7–4.0)
MCH: 32.9 pg (ref 26.0–34.0)
MCHC: 34.4 g/dL (ref 30.0–36.0)
MCV: 95.4 fL (ref 80.0–100.0)
Monocytes Absolute: 1 10*3/uL (ref 0.1–1.0)
Monocytes Relative: 10 %
Neutro Abs: 6.6 10*3/uL (ref 1.7–7.7)
Neutrophils Relative %: 62 %
Platelets: 240 10*3/uL (ref 150–400)
RBC: 4.17 MIL/uL — ABNORMAL LOW (ref 4.22–5.81)
RDW: 14.2 % (ref 11.5–15.5)
WBC: 10.3 10*3/uL (ref 4.0–10.5)
nRBC: 0 % (ref 0.0–0.2)

## 2019-02-05 LAB — URIC ACID: Uric Acid, Serum: 13.5 mg/dL — ABNORMAL HIGH (ref 3.7–8.6)

## 2019-02-05 LAB — BASIC METABOLIC PANEL
Anion gap: 14 (ref 5–15)
BUN: 11 mg/dL (ref 6–20)
CO2: 21 mmol/L — ABNORMAL LOW (ref 22–32)
Calcium: 8.9 mg/dL (ref 8.9–10.3)
Chloride: 100 mmol/L (ref 98–111)
Creatinine, Ser: 1.21 mg/dL (ref 0.61–1.24)
GFR calc Af Amer: >60 mL/min (ref >60)
GFR calc non Af Amer: >60 mL/min (ref >60)
Glucose, Bld: 180 mg/dL — ABNORMAL HIGH (ref 70–99)
Potassium: 2.8 mmol/L — ABNORMAL LOW (ref 3.5–5.1)
Sodium: 135 mmol/L (ref 135–145)

## 2019-02-05 LAB — SEDIMENTATION RATE: Sed Rate: 16 mm/hr (ref 0–20)

## 2019-02-05 MED ORDER — OXYCODONE-ACETAMINOPHEN 5-325 MG PO TABS: 1.0000 | ORAL_TABLET | Freq: Four times a day (QID) | ORAL | 0 refills | Status: DC | PRN

## 2019-02-05 MED ORDER — NAPROXEN 500 MG PO TABS: 500.0000 mg | ORAL_TABLET | Freq: Two times a day (BID) | ORAL | 0 refills | Status: DC

## 2019-02-05 MED ORDER — POTASSIUM CHLORIDE ER 10 MEQ PO TBCR: 10.0000 meq | EXTENDED_RELEASE_TABLET | Freq: Two times a day (BID) | ORAL | 0 refills | Status: DC

## 2019-02-05 MED ORDER — METHYLPREDNISOLONE SODIUM SUCC 125 MG IJ SOLR
125.0000 mg | Freq: Once | INTRAMUSCULAR | Status: AC
  Administered 2019-02-05: 125 mg via INTRAVENOUS
  Filled 2019-02-05: qty 2

## 2019-02-05 MED ORDER — PREDNISONE 20 MG PO TABS: ORAL_TABLET | ORAL | 0 refills | Status: AC

## 2019-02-05 MED ORDER — KETOROLAC TROMETHAMINE 30 MG/ML IJ SOLN
15.0000 mg | INTRAMUSCULAR | Status: AC
  Administered 2019-02-05: 09:00:00 15 mg via INTRAVENOUS
  Filled 2019-02-05: qty 1

## 2019-02-05 NOTE — ED Triage Notes (Signed)
Says left foot is infected again.  Pain "to the bone"

## 2019-02-05 NOTE — Telephone Encounter (Signed)
Patient called in to let Dr. Rockey Situ know that he is back in the hospital with his foot infection

## 2019-02-05 NOTE — ED Notes (Signed)
Patient transported to MRI 

## 2019-02-05 NOTE — ED Notes (Signed)
Pt had 2 MI 5/20 - then 2 weeks later had staph in left foot and rx'd atb, it began to get better and then got worse again and he took a friends atb - 3 days ago he felt the "infection coming back" Pt is here today for more atb  Left foot red and swollen - painful to touch and pt has difficulty moving foot

## 2019-02-05 NOTE — ED Notes (Signed)
Pt screened by MRI.  °

## 2019-02-05 NOTE — Telephone Encounter (Signed)
FYI only- Sending to [place]. Went to ED for foot pain. Hx of staph, treating as gout w referral to podiatry. Sounds like he may have osteo.   Pt wanted you to know although it doesn't sound like he has any cardiology needs at this time.

## 2019-02-05 NOTE — ED Provider Notes (Signed)
[facility] Emergency Department Provider Note  ____________________________________________  Time seen: Approximately 8:44 AM  I have reviewed the triage vital signs and the nursing notes.   HISTORY  Chief Complaint Foot Pain and Wound Check    HPI Edward [name] is a 53 y.o. male with a history of polysubstance abuse, heart attack, recently seen in the ED 1 month ago by me with left foot pain and swelling.  At that time his evaluation was consistent with cellulitis with a mild leukocytosis, negative ultrasound, and he was started on Bactrim and Keflex.  He reports that he took those antibiotics and the symptoms did improve, and then started getting worse again over the past 3 days.  He took some leftover antibiotics that her friend had, but his symptoms have continued to worsen over this past 3 days, constant, worse with weightbearing, no alleviating factors.  Nonradiating.  Pain is moderate to severe.  Denies fevers chills body aches chest pain or shortness of breath      Past Medical History:  Diagnosis Date  . Alcohol abuse   . Colon polyp 2016  . Herpes   . MI (myocardial infarction) (HCC)   . Polysubstance abuse (HCC)   . Vitamin B 12 deficiency      Patient Active Problem List   Diagnosis Date Noted  . Dilated cardiomyopathy (HCC) 01/08/2019  . Polysubstance abuse (HCC) 01/08/2019  . Anxiety 01/08/2019  . Chest pain 12/17/2018  . Leg pain 08/07/2018  . Neuropathy 06/23/2018  . Varicose veins of both lower extremities 06/23/2018  . Bilateral cold feet 06/23/2018     Past Surgical History:  Procedure Laterality Date  . ANAL FISTULOTOMY  04/03/2016   Procedure: ANAL FISTULOTOMY;  Surgeon: Kieth Brightly, MD;  Location: [facility] ORS;  Service: General;;  . BLADDER SURGERY    . COLONOSCOPY  07/06/2015   Dr Shelle Iron  . KNEE ARTHROSCOPY Left   . LEFT HEART CATH AND CORONARY ANGIOGRAPHY N/A 12/19/2018   Procedure: LEFT HEART CATH AND CORONARY  ANGIOGRAPHY;  Surgeon: Antonieta Iba, MD;  Location: [facility] INVASIVE CV LAB;  Service: Cardiovascular;  Laterality: N/A;  . UPPER GASTROINTESTINAL ENDOSCOPY  07/06/2015     Prior to Admission medications   Medication Sig Start Date End Date Taking? Authorizing Provider  aspirin 81 MG chewable tablet Chew 1 tablet (81 mg total) by mouth daily. 12/21/18   Altamese Dilling, MD  busPIRone (BUSPAR) 10 MG tablet Take 10 mg by mouth at bedtime as needed for anxiety. 09/17/18   [provider]  cephALEXin (KEFLEX) 500 MG capsule Take 1 capsule (500 mg total) by mouth 3 (three) times daily. 01/06/19   Sharman Cheek, MD  co-enzyme Q-10 30 MG capsule Take 30 mg by mouth daily.    [provider]  furosemide (LASIX) 40 MG tablet Take 1 tablet (40 mg total) by mouth daily. 01/09/19   Antonieta Iba, MD  naproxen (NAPROSYN) 500 MG tablet Take 1 tablet (500 mg total) by mouth 2 (two) times daily with a meal. 01/06/19   Sharman Cheek, MD  Omega-3 Fatty Acids (FISH OIL) 1000 MG CPDR Take 1,000 mg by mouth daily.     [provider]  ondansetron (ZOFRAN ODT) 4 MG disintegrating tablet Take 1 tablet (4 mg total) by mouth every 8 (eight) hours as needed for nausea or vomiting. 01/06/19   Sharman Cheek, MD  oxyCODONE-acetaminophen (PERCOCET) 5-325 MG tablet Take 1 tablet by mouth every 6 (six) hours as  needed for severe pain. 01/06/19 01/06/20  Sharman CheekStafford, Carianne Taira, MD  sacubitril-valsartan (ENTRESTO) 24-26 MG Take 1 tablet by mouth 2 (two) times daily. 01/09/19   Antonieta IbaGollan, Timothy J, MD  spironolactone (ALDACTONE) 25 MG tablet Take 0.5 tablets (12.5 mg total) by mouth daily. 01/09/19   Antonieta IbaGollan, Timothy J, MD  sulfamethoxazole-trimethoprim (BACTRIM DS) 800-160 MG tablet Take 1 tablet by mouth 2 (two) times daily. 01/06/19   Sharman CheekStafford, Daisy Lites, MD  traZODone (DESYREL) 50 MG tablet Take 50 mg by mouth 4 (four) times daily. Patient takes 4 tablets at bedtime. 09/19/18   [provider]   vitamin B-12 (CYANOCOBALAMIN) 500 MCG tablet Take 500 mcg by mouth daily.    [provider]  Zinc Sulfate (ZINC-220 PO) Take 220 mg by mouth daily.     [provider]     Allergies Codeine   Family History  Problem Relation Age of Onset  . Colon cancer Maternal Grandmother   . Prostate cancer Paternal Uncle   . Leukemia Paternal Uncle   . Heart failure Father   . CAD Sister     Social History Social History   Tobacco Use  . Smoking status: Never Smoker  . Smokeless tobacco: Never Used  Substance Use Topics  . Alcohol use: Yes    Comment: moderate use- does not like to quantify  . Drug use: Yes    Types: Marijuana, Cocaine    Review of Systems  Constitutional:   No fever or chills.  ENT:   No sore throat. No rhinorrhea. Cardiovascular:   No chest pain or syncope. Respiratory:   No dyspnea or cough. Gastrointestinal:   Negative for abdominal pain, vomiting and diarrhea.  Musculoskeletal:   Left foot pain and swelling as above. All other systems reviewed and are negative except as documented above in ROS and HPI.  ____________________________________________   PHYSICAL EXAM:  VITAL SIGNS: ED Triage Vitals  Enc Vitals Group     BP 02/05/19 0748 109/75     Pulse Rate 02/05/19 0748 78     Resp --      Temp 02/05/19 0748 98 F (36.7 C)     Temp Source 02/05/19 0748 Oral     SpO2 02/05/19 0748 98 %     Weight 02/05/19 0748 193 lb (87.5 kg)     Height 02/05/19 0748 6\' 3"  (1.905 m)     Head Circumference --      Peak Flow --      Pain Score 02/05/19 0740 8     Pain Loc --      Pain Edu? --      Excl. in GC? --     Vital signs reviewed, nursing assessments reviewed.   Constitutional:   Alert and oriented. Non-toxic appearance.  Ambulatory Eyes:   Conjunctivae are normal. EOMI. PERRL. ENT      Head:   Normocephalic and atraumatic.      Nose:   No congestion/rhinnorhea.       Mouth/Throat:   MMM, no pharyngeal erythema. No  peritonsillar mass.       Neck:   No meningismus. Full ROM. Hematological/Lymphatic/Immunilogical:   No cervical lymphadenopathy. Cardiovascular:   RRR. Symmetric bilateral radial and DP pulses.  No murmurs. Cap refill less than 2 seconds. Respiratory:   Normal respiratory effort without tachypnea/retractions. Breath sounds are clear and equal bilaterally. No wheezes/rales/rhonchi. Gastrointestinal:   Soft and nontender. Non distended. There is no CVA tenderness.  No rebound, rigidity, or guarding.  Musculoskeletal:  Normal range of motion in all extremities. No joint effusions.  Swelling erythema and tenderness over the left dorsal forefoot.  No crepitus or fluctuance.  No induration.  No bony point tenderness.  Pain and tenderness seems to be centered around the second distal metatarsal and second toe.  Good distal perfusion and capillary refill in all toes.  No discoloration or evidence of necrosis.  No open wounds or drainage. Neurologic:   Normal speech and language.  Motor grossly intact. No acute focal neurologic deficits are appreciated.  Skin:    Skin is warm, dry and intact. No rash noted.  No petechiae, purpura, or bullae.  ____________________________________________    LABS (pertinent positives/negatives) (all labs ordered are listed, but only abnormal results are displayed) Labs Reviewed  BASIC METABOLIC PANEL - Abnormal; Notable for the following components:      Result Value   Potassium 2.8 (*)    CO2 21 (*)    Glucose, Bld 180 (*)    All other components within normal limits  CBC WITH DIFFERENTIAL/PLATELET - Abnormal; Notable for the following components:   RBC 4.17 (*)    All other components within normal limits  SEDIMENTATION RATE   ____________________________________________   EKG    ____________________________________________    RADIOLOGY  No results  found.  ____________________________________________   PROCEDURES Procedures  ____________________________________________    CLINICAL IMPRESSION / ASSESSMENT AND PLAN / ED COURSE  Medications ordered in the ED: Medications  ketorolac (TORADOL) 30 MG/ML injection 15 mg (has no administration in time range)    Pertinent labs & imaging results that were available during my care of the patient were reviewed by me and considered in my medical decision making (see chart for details).  Edward [name] was evaluated in Emergency Department on 02/05/2019 for the symptoms described in the history of present illness. He was evaluated in the context of the global COVID-19 pandemic, which necessitated consideration that the patient might be at risk for infection with the SARS-CoV-2 virus that causes COVID-19. Institutional protocols and algorithms that pertain to the evaluation of patients at risk for COVID-19 are in a state of rapid change based on information released by regulatory bodies including the CDC and federal and state organizations. These policies and algorithms were followed during the patient's care in the ED.   Patient presents with pain redness and swelling of the left foot, possibly recurrent cellulitis versus osteomyelitis.  Will obtain labs and an x-ray of the foot.  If x-ray is nondiagnostic, at this point he will need an MRI of the foot to further evaluate.  Clinical Course as of Feb 05 1211  Thu Feb 05, 2019  [id number] Xray foot unremarkable. Finding of possible prox. 5th MT fx unrelated to current sx, likely chronic/remote injury. Will order MRI.    [PS]  1034 Uric acid severely elevated.  We will give potassium replacement, give Solu-Medrol for gout flare.  Uric Acid, Serum(!): 13.5 [PS]  1143 MRI reviewed which does show inflammatory changes at the second MTP joint.  Results discussed with Dr. Graciela HusbandsKlein of podiatry who agrees that with the very high uric acid level, this is all likely  to be due to gout and not actually osteomyelitis or septic arthritis.  Recommends steroids and follow-up.   [PS]    Clinical Course User Index [PS] Sharman CheekStafford, Marah Park, MD    ----------------------------------------- 12:13 PM on 02/05/2019 -----------------------------------------  Patient informed of results and plan.  Agreeable with follow-up with podiatry and primary care.  Steroids and NSAIDs for now, very limited prescription for Percocet for immediate pain needs.  With normal white blood cell count, normal vital signs, normal sed rate, normal x-ray, agrees to presentation is unlikely to be due to osteomyelitis or septic arthritis and very likely gout related with his hyperuricemia.  ____________________________________________   FINAL CLINICAL IMPRESSION(S) / ED DIAGNOSES    Final diagnoses:  None     ED Discharge Orders    None      Portions of this note were generated with dragon dictation software. Dictation errors may occur despite best attempts at proofreading.   Carrie Mew, MD 02/05/19 1213

## 2019-02-05 NOTE — Telephone Encounter (Signed)
Spoke with Elmyra Ricks and she needed to answer some questions regarding his return to work. Reviewed that per last visit he was to stay out until repeat visit in August. She took all information down and had no further questions at this time. Will route to scheduling to get his follow up appointment in August.

## 2019-02-15 ENCOUNTER — Other Ambulatory Visit: Payer: Self-pay | Admitting: Cardiovascular Disease

## 2019-03-11 NOTE — Progress Notes (Signed)
Cardiology Office Note    Date:  03/12/2019   ID:  Edward [name], DOB 1966/01/29, MRN [id number]  PCP:  Evie LacksPointer, Elizabeth, NP  Cardiologist:  Julien Nordmannimothy Gollan, MD  Electrophysiologist:  None   Chief Complaint: Follow up  History of Present Illness:   Edward BostonRonald M Cavanaugh is a 53 y.o. male with history of HFrEF secondary to NICM, polysubstance abuse with cocaine, alcohol, and marijuana abuse, peripheral neuropathy with prior normal noninvasive vascular workup, anxiety, and panic attack who presents for follow up of his NICM.   Patient was admitted to the hospital in 11/2018 with increased SOB and chest discomfort with minimal troponin elevation of 0.04. CTA chest negative for PE, thoracic aortic dissection or aneurysm with mild LAD and aortic calcification. He reported rare cocaine use, daily marijuana use, and on average drinking 6 beers daily. Echo showed an EF of 20-25%, mildly dilated left ventricle, global HK without RWMA, normal RVSF and cavity size, mild MR. In this setting, he underwent LHC on 12/19/2018 that showed no significant CAD as outlined below with elevated LVEDP of 22 mmHg with optimization of medications advised. He was seen virtually on 01/09/2019 and reported cessation of cocaine with continued alcohol use. He noted continued SOB with exertion. Following this visit, he was seen in the ED on 7/9 with staph infection of the foot with imaging concerning for osteomyelitis with recommendation to follow up with PCP.   Patient comes in doing well today.  He denies any chest pain, shortness of breath, palpitations, presyncope, or syncope.  He does note some positional dizziness if he stands up quickly.  He denies any lower extremity swelling, abdominal tension, orthopnea, PND, or early satiety.  He is not adding salt to any foods.  He does drink just over 2 L of fluids on a daily basis.  He indicates he last used cocaine prior to his admission in 11/2018.  He continues to smoke marijuana daily.   He drinks 4-6 beers approximately 3 to 4 days/week which he indicates is about a 50% reduction in alcohol consumption.  He reports compliance with all medications.  He has not had any falls, BRBPR, or melena since last visit.  He was prescribed 1 week of potassium repletion following his ED visit secondary to hypokalemia as outlined above.  He has since completed that therapy.  Lower extremity wounds have healed.  Patient now notes no further lower extremity paresthesias and states "I can now feel my toes."  Overall, he is pleased with his progress.  Labs: 01/2019 - WBC 10.3, HGB 13.7, PLT 240, K+ 2.8, SCr 1.21 11/2018 - A1c 5.6, direct LDL 72, TG 457   Past Medical History:  Diagnosis Date   Alcohol abuse    Colon polyp 2016   Herpes    MI (myocardial infarction) (HCC)    Polysubstance abuse (HCC)    Vitamin B 12 deficiency     Past Surgical History:  Procedure Laterality Date   ANAL FISTULOTOMY  04/03/2016   Procedure: ANAL FISTULOTOMY;  Surgeon: Kieth BrightlySeeplaputhur G Sankar, MD;  Location: [facility] ORS;  Service: General;;   BLADDER SURGERY     COLONOSCOPY  07/06/2015   Dr Shelle Ironein   KNEE ARTHROSCOPY Left    LEFT HEART CATH AND CORONARY ANGIOGRAPHY N/A 12/19/2018   Procedure: LEFT HEART CATH AND CORONARY ANGIOGRAPHY;  Surgeon: Antonieta IbaGollan, Timothy J, MD;  Location: [facility] INVASIVE CV LAB;  Service: Cardiovascular;  Laterality: N/A;   UPPER GASTROINTESTINAL ENDOSCOPY  07/06/2015  Current Medications: Current Meds  Medication Sig   aspirin 81 MG chewable tablet Chew 1 tablet (81 mg total) by mouth daily.   ENTRESTO 24-26 MG TAKE 1 TABLET BY MOUTH TWICE A DAY   furosemide (LASIX) 40 MG tablet Take 1 tablet (40 mg total) by mouth daily.   Omega-3 Fatty Acids (FISH OIL) 1000 MG CPDR Take 1,000 mg by mouth daily.    spironolactone (ALDACTONE) 25 MG tablet Take 0.5 tablets (12.5 mg total) by mouth daily.   vitamin B-12 (CYANOCOBALAMIN) 500 MCG tablet Take 500 mcg by mouth daily.   Zinc  Sulfate (ZINC-220 PO) Take 220 mg by mouth daily.      Allergies:   Codeine   Social History   Socioeconomic History   Marital status: Single    Spouse name: Not on file   Number of children: Not on file   Years of education: Not on file   Highest education level: Not on file  Occupational History   Not on file  Social Needs   Financial resource strain: Not on file   Food insecurity    Worry: Not on file    Inability: Not on file   Transportation needs    Medical: Not on file    Non-medical: Not on file  Tobacco Use   Smoking status: Never Smoker   Smokeless tobacco: Never Used  Substance and Sexual Activity   Alcohol use: Yes    Comment: moderate use- does not like to quantify   Drug use: Yes    Types: Marijuana, Cocaine   Sexual activity: Not on file  Lifestyle   Physical activity    Days per week: Not on file    Minutes per session: Not on file   Stress: Not on file  Relationships   Social connections    Talks on phone: Not on file    Gets together: Not on file    Attends religious service: Not on file    Active member of club or organization: Not on file    Attends meetings of clubs or organizations: Not on file    Relationship status: Not on file  Other Topics Concern   Not on file  Social History Narrative   Is at home by himself.  Independent     Family History:  The patient's family history includes CAD in his sister; Colon cancer in his maternal grandmother; Heart failure in his father; Leukemia in his paternal uncle; Prostate cancer in his paternal uncle.  ROS:   Review of Systems  Constitutional: Negative for chills, diaphoresis, fever, malaise/fatigue and weight loss.  HENT: Negative for congestion.   Eyes: Negative for discharge and redness.  Respiratory: Negative for cough, hemoptysis, sputum production, shortness of breath and wheezing.   Cardiovascular: Negative for chest pain, palpitations, orthopnea, claudication, leg  swelling and PND.  Gastrointestinal: Negative for abdominal pain, blood in stool, heartburn, melena, nausea and vomiting.  Genitourinary: Negative for hematuria.  Musculoskeletal: Negative for falls and myalgias.  Skin: Negative for rash.  Neurological: Positive for dizziness. Negative for tingling, tremors, sensory change, speech change, focal weakness, loss of consciousness and weakness.  Endo/Heme/Allergies: Does not bruise/bleed easily.  Psychiatric/Behavioral: Positive for substance abuse. The patient is not nervous/anxious.   All other systems reviewed and are negative.    EKGs/Labs/Other Studies Reviewed:    Studies reviewed were summarized above. The additional studies were reviewed today:  LHC 11/2018: Coronary angiography:  Coronary dominance: Left dominant  Left mainstem:  Large vessel that bifurcates into the LAD and left circumflex, no significant disease noted  Left anterior descending (LAD):   Large vessel that extends to the apical region, diagonal branch 2 of moderate size, no significant disease noted  Left circumflex (LCx):  Large vessel with OM branch 2, no significant disease noted  Right coronary artery (RCA):  Right nondominant vessel with PL and PDA, no significant disease noted  Left ventriculography: Left ventricular systolic function is severely reduced, LVEF is estimated at 20 %, there is no significant mitral regurgitation , no significant aortic valve stenosis  Left ventricular end-diastolic pressure 22  Final Conclusions:   No significant coronary artery disease Elevated left ventricular  end-diastolic pressure Severely reduced left ventricular function, EF estimated 20%, global hypokinesis dilated LV  Recommendations:  Nonischemic cardiomyopathy Etiology of his dilated cardiomyopathy is unclear, denies excessive alcohol, no recent viral illness Appears idiopathic We have recommended cessation of alcohol and cocaine Suggested we start  Entresto 24/26 mg p.o. twice daily, stop losartan We will hold off on beta-blockers at this time given recent cocaine use Start Spironolactone 12.5 mg daily Given elevated left ventricular end-diastolic pressure, start Lasix 40 mg this evening Up titration of medications as blood pressure tolerates __________  2D Echo 11/2018: 1. The left ventricle has severely reduced systolic function, with an ejection fraction of 20-25%. The cavity size was mildly dilated. Left ventricular diastolic Doppler parameters are indeterminate. Left ventrical global hypokinesis without regional wall motion abnormalities.  2. The right ventricle has normal systolic function. The cavity was normal. There is no increase in right ventricular wall thickness.   EKG:  EKG is ordered today.  The EKG ordered today demonstrates NSR, 73 bpm, inferolateral TWI  Recent Labs: 02/05/2019: BUN 11; Creatinine, Ser 1.21; Hemoglobin 13.7; Platelets 240; Potassium 2.8; Sodium 135  Recent Lipid Panel    Component Value Date/Time   CHOL 174 12/17/2018 1444   TRIG 457 (H) 12/17/2018 1444   HDL 45 12/17/2018 1444   CHOLHDL 3.9 12/17/2018 1444   VLDL UNABLE TO CALCULATE IF TRIGLYCERIDE OVER 400 mg/dL 88/28/0034 9179   LDLCALC UNABLE TO CALCULATE IF TRIGLYCERIDE OVER 400 mg/dL 15/11/6977 4801   LDLDIRECT 72.0 12/17/2018 1444    PHYSICAL EXAM:    VS:  BP 120/80 (BP Location: Left Arm, Patient Position: Sitting, Cuff Size: Normal)    Pulse 73    Ht 6\' 3"  (1.905 m)    Wt 191 lb 8 oz (86.9 kg)    BMI 23.94 kg/m   BMI: Body mass index is 23.94 kg/m.  Physical Exam  Constitutional: He is oriented to person, place, and time. He appears well-developed and well-nourished.  HENT:  Head: Normocephalic and atraumatic.  Eyes: Right eye exhibits no discharge. Left eye exhibits no discharge.  Neck: Normal range of motion. No JVD present.  Cardiovascular: Normal rate, regular rhythm, S1 normal, S2 normal and normal heart sounds. Exam reveals  no distant heart sounds, no friction rub, no midsystolic click and no opening snap.  No murmur heard. Pulses:      Dorsalis pedis pulses are 2+ on the right side and 1+ on the left side.       Posterior tibial pulses are 2+ on the right side and 1+ on the left side.  Pulmonary/Chest: Effort normal and breath sounds normal. No respiratory distress. He has no decreased breath sounds. He has no wheezes. He has no rales. He exhibits no tenderness.  Abdominal: Soft. He exhibits no distension. There is  no abdominal tenderness.  Musculoskeletal:        General: No edema.  Neurological: He is alert and oriented to person, place, and time.  Skin: Skin is warm and dry. No cyanosis. Nails show no clubbing.  Psychiatric: He has a normal mood and affect. His speech is normal and behavior is normal. Judgment and thought content normal.    Wt Readings from Last 3 Encounters:  03/12/19 191 lb 8 oz (86.9 kg)  02/05/19 193 lb (87.5 kg)  01/09/19 197 lb (89.4 kg)     ASSESSMENT & PLAN:   1. HFrEF secondary to NICM: Patient appears euvolemic and well compensated.  Likely in the setting of longstanding polysubstance abuse including alcohol and cocaine.  Patient's weight is down 6 pounds, compared to his last visit.  Blood pressure typically runs in the 90s to 1 teens systolic.  He does note some positional dizziness if he stands quickly.  Continue aspirin, Lasix, Entresto, and spironolactone.  Unable to titrate medical therapy at this time secondary to relative hypotension with noted positional dizziness.  Not currently on beta-blocker given recent cocaine use.  Continue to defer initiation of beta-blocker at this time secondary to reported positional dizziness and relative hypotension.  Also, there are: Some concerns of possible relapse of substance abuse.  Recommend obtaining echo in approximately 1 month to evaluate for improvement in LV systolic function.  2. Polysubstance abuse: Patient indicates he has not  abused cocaine since prior to his admission in 11/2018.  He continues to smoke marijuana on a daily basis.  He is drinking 4-6 beers approximately 3 to 4 days/week and indicates this is a 50% reduction in use.  Recommend complete cessation.  3. Lower extremity paresthesias/decreased distal pedal pulses on the left side: Obtain lower extremity ABI.  Cannot exclude underlying alcohol abuse.  If lower extremity ABI is unrevealing recommend patient follow-up with PCP.  4. Hypokalemia: Noted at his recent ED visit.  Status post 1 week of KCl 10 mEq 3 times daily.  Remains on Entresto and spironolactone.  I advised patient I recommend checking a BMP today to evaluate his renal function and potassium however, patient declines this today stating "I do not want my blood drawn today."  In this setting, when patient comes for his follow-up echo and lower extremity ultrasound recommend obtaining BMP at that time.  Disposition: F/u with Dr. Mariah MillingGollan or an APP in 4 to 6 weeks.   Medication Adjustments/Labs and Tests Ordered: Current medicines are reviewed at length with the patient today.  Concerns regarding medicines are outlined above. Medication changes, Labs and Tests ordered today are summarized above and listed in the Patient Instructions accessible in Encounters.   Signed, Eula Listenyan Lilymae Swiech, PA-C 03/12/2019 8:53 AM     [facility] [place] [address] [place], [place] [address] [phone]) [phone]

## 2019-03-12 ENCOUNTER — Ambulatory Visit (INDEPENDENT_AMBULATORY_CARE_PROVIDER_SITE_OTHER): Payer: No Typology Code available for payment source | Admitting: Physician Assistant

## 2019-03-12 ENCOUNTER — Encounter: Payer: Self-pay | Admitting: Physician Assistant

## 2019-03-12 ENCOUNTER — Other Ambulatory Visit: Payer: Self-pay

## 2019-03-12 VITALS — BP 120/80 | HR 73 | Ht 75.0 in | Wt 191.5 lb

## 2019-03-12 DIAGNOSIS — R0989 Other specified symptoms and signs involving the circulatory and respiratory systems: Secondary | ICD-10-CM

## 2019-03-12 DIAGNOSIS — F1910 Other psychoactive substance abuse, uncomplicated: Secondary | ICD-10-CM

## 2019-03-12 DIAGNOSIS — I428 Other cardiomyopathies: Secondary | ICD-10-CM | POA: Diagnosis not present

## 2019-03-12 DIAGNOSIS — E876 Hypokalemia: Secondary | ICD-10-CM

## 2019-03-12 DIAGNOSIS — I5022 Chronic systolic (congestive) heart failure: Secondary | ICD-10-CM

## 2019-03-12 DIAGNOSIS — R202 Paresthesia of skin: Secondary | ICD-10-CM | POA: Diagnosis not present

## 2019-03-12 NOTE — Patient Instructions (Addendum)
Medication Instructions:  - Your physician recommends that you continue on your current medications as directed. Please refer to the Current Medication list given to you today.  If you need a refill on your cardiac medications before your next appointment, please call your pharmacy.   Lab work: - none ordered  If you have labs (blood work) drawn today and your tests are completely normal, you will receive your results only by: Marland Kitchen MyChart Message (if you have MyChart) OR . A paper copy in the mail If you have any lab test that is abnormal or we need to change your treatment, we will call you to review the results.  Testing/Procedures: - Your physician has requested that you have an ankle brachial index (ABI)- in 1 month. During this test an ultrasound and blood pressure cuff are used to evaluate the arteries that supply the arms and legs with blood. Allow thirty minutes for this exam. There are no restrictions or special instructions.  - Your physician has requested that you have a lower extremity arterial duplex- in 1 month. This test is an ultrasound of the arteries in the legs. It looks at arterial blood flow in the legs. Allow one hour for Lower Arterial scans. There are no restrictions or special instructions  - Your physician has requested that you have an echocardiogram- in 1 month. Echocardiography is a painless test that uses sound waves to create images of your heart. It provides your doctor with information about the size and shape of your heart and how well your heart's chambers and valves are working. This procedure takes approximately one hour. There are no restrictions for this procedure.  Follow-Up: At [facility], you and your health needs are our priority.  As part of our continuing mission to provide you with exceptional heart care, we have created designated Provider Care Teams.  These Care Teams include your primary Cardiologist (physician) and Advanced Practice Providers  (APPs -  Physician Assistants and Nurse Practitioners) who all work together to provide you with the care you need, when you need it. . in 6 weeks with Dr. Rockey Situ  Any Other Special Instructions Will Be Listed Below (If Applicable). - N/A   Ankle-Brachial Index Test Why am I having this test? The ankle-brachial index (ABI) test is used to diagnose peripheral vascular disease (PVD). PVD is also known as peripheral arterial disease (PAD). PVD is the blocking or hardening of the arteries anywhere within the circulatory system beyond the heart. PVD is caused by:  Cholesterol deposits in your blood vessels (atherosclerosis). This is the most common cause of this condition.  Irritation and swelling (inflammation) in the blood vessels.  Blood clots in the vessels. Cholesterol deposits cause arteries to narrow. Normal delivery of oxygen to your tissues is affected, causing muscle pain and fatigue. This is called claudication. PVD means that there may also be a buildup of cholesterol:  In your heart. This increases the risk of heart attacks.  In your brain. This increases the risk of strokes. What is being tested? The ankle-brachial index test measures the blood flow in your arms and legs. The blood flow will show if blood vessels in your legs have been narrowed by cholesterol deposits. How do I prepare for this test?  Wear loose clothing.  Do not use any tobacco products, including cigarettes, chewing tobacco, or e-cigarettes, for at least 30 minutes before the test. What happens during the test?  1. You will lie down in a resting position. 2.  Your health care provider will use a blood pressure machine and a small ultrasound device (Doppler) to measure the systolic pressures on your upper arms and ankles. Systolic pressure is the pressure inside your arteries when your heart pumps. 3. Systolic pressure measurements will be taken several times, and at several points, on both the ankle and the  arm. 4. Your health care provider will divide the highest systolic pressure of the ankle by the highest systolic pressure of the arm. The result is the ankle-brachial pressure ratio, or ABI. Sometimes this test will be repeated after you have exercised on a treadmill for 5 minutes. You may have leg pain during the exercise portion of the test if you suffer from PVD. If the index number drops after exercise, this may show that PVD is present. How are the results reported? Your test results will be reported as a value that shows the ratio of your ankle pressure to your arm pressure (ABI ratio). Your health care provider will compare your results to normal ranges that were established after testing a large group of people (reference ranges). Reference ranges may vary among labs and hospitals. For this test, a common reference range is:  ABI ratio of 0.9 to 1.3. What do the results mean? An ABI ratio that is below the reference range is considered abnormal and may indicate PVD in the legs. Talk with your health care provider about what your results mean. Questions to ask your health care provider Ask your health care provider, or the department that is doing the test:  When will my results be ready?  How will I get my results?  What are my treatment options?  What other tests do I need?  What are my next steps? Summary  The ankle-brachial index (ABI) test is used to diagnose peripheral vascular disease (PVD). PVD is also known as peripheral arterial disease (PAD).  The ankle-brachial index test measures the blood flow in your arms and legs.  The highest systolic pressure of the ankle is divided by the highest systolic pressure of the arm. The result is the ABI ratio.  An ABI ratio that is below 0.9 is considered abnormal and may indicate PVD in the legs. This information is not intended to replace advice given to you by your health care provider. Make sure you discuss any questions you  have with your health care provider. Document Released: 07/20/2004 Document Revised: 05/08/2018 Document Reviewed: 04/09/2017 Elsevier Patient Education  2020 [employer].    Echocardiogram An echocardiogram is a procedure that uses painless sound waves (ultrasound) to produce an image of the heart. Images from an echocardiogram can provide important information about:  Signs of coronary artery disease (CAD).  Aneurysm detection. An aneurysm is a weak or damaged part of an artery wall that bulges out from the normal force of blood pumping through the body.  Heart size and shape. Changes in the size or shape of the heart can be associated with certain conditions, including heart failure, aneurysm, and CAD.  Heart muscle function.  Heart valve function.  Signs of a past heart attack.  Fluid buildup around the heart.  Thickening of the heart muscle.  A tumor or infectious growth around the heart valves. Tell a health care provider about:  Any allergies you have.  All medicines you are taking, including vitamins, herbs, eye drops, creams, and over-the-counter medicines.  Any blood disorders you have.  Any surgeries you have had.  Any medical conditions you have.  Whether you are pregnant or may be pregnant. What are the risks? Generally, this is a safe procedure. However, problems may occur, including:  Allergic reaction to dye (contrast) that may be used during the procedure. What happens before the procedure? No specific preparation is needed. You may eat and drink normally. What happens during the procedure?   An IV tube may be inserted into one of your veins.  You may receive contrast through this tube. A contrast is an injection that improves the quality of the pictures from your heart.  A gel will be applied to your chest.  A wand-like tool (transducer) will be moved over your chest. The gel will help to transmit the sound waves from the transducer.  The  sound waves will harmlessly bounce off of your heart to allow the heart images to be captured in real-time motion. The images will be recorded on a computer. The procedure may vary among health care providers and hospitals. What happens after the procedure?  You may return to your normal, everyday life, including diet, activities, and medicines, unless your health care provider tells you not to do that. Summary  An echocardiogram is a procedure that uses painless sound waves (ultrasound) to produce an image of the heart.  Images from an echocardiogram can provide important information about the size and shape of your heart, heart muscle function, heart valve function, and fluid buildup around your heart.  You do not need to do anything to prepare before this procedure. You may eat and drink normally.  After the echocardiogram is completed, you may return to your normal, everyday life, unless your health care provider tells you not to do that. This information is not intended to replace advice given to you by your health care provider. Make sure you discuss any questions you have with your health care provider. Document Released: 07/13/2000 Document Revised: 11/06/2018 Document Reviewed: 08/18/2016 Elsevier Patient Education  2020 [employer]

## 2019-03-14 ENCOUNTER — Other Ambulatory Visit: Payer: Self-pay | Admitting: Cardiovascular Disease

## 2019-04-01 ENCOUNTER — Telehealth: Payer: Self-pay | Admitting: Cardiovascular Disease

## 2019-04-01 NOTE — Telephone Encounter (Signed)
Patient calling in about his condition/diagnosis. Patient wants to discuss outcomes and such with nurse or provider. Please advise

## 2019-04-01 NOTE — Telephone Encounter (Signed)
Pt reports that he is worried about his heart condition and said that he googled his condition and said that he has less than 5 years to live. Pt sounding concerned. I told him that I am not able to comment on his prognosis and it would be best to talk to Dr. Rockey Situ about it. Pt has f/u scheduled, Echo on 9/25 and OV on 9/28. appts confirmed with patient.   Pt appreciative of call.   Advised pt to call for any further questions or concerns.

## 2019-04-02 NOTE — Telephone Encounter (Signed)
Spoke with patient and reviewed that I understand his concerns and that if he can continue medications, testing, and following up with providers his prognosis can be much better. Provided some emotional support to him and encouraged him to keep pushing and that once his testing has been done we will have more information to review with him. He verbalized understanding was very appreciative for the call and had no further questions at this time.

## 2019-04-24 ENCOUNTER — Other Ambulatory Visit: Payer: Self-pay

## 2019-04-24 ENCOUNTER — Ambulatory Visit (INDEPENDENT_AMBULATORY_CARE_PROVIDER_SITE_OTHER): Payer: No Typology Code available for payment source

## 2019-04-24 DIAGNOSIS — I428 Other cardiomyopathies: Secondary | ICD-10-CM

## 2019-04-25 NOTE — Progress Notes (Signed)
Cardiology Office Note  Date:  04/27/2019   ID:  Edward [name], DOB 05/26/1966, MRN [id number]  PCP:  Evie Lacks, NP   Chief Complaint  Patient presents with  . other    6 wk f/u. CHF/ cardiomyopathy/lea and echo.BP elevated at times.Medications reviewed verbally.     HPI:  Edward [name] is a 53 y.o. male who presents via audio/video conferencing for a telehealth visit today.   The patient does not symptoms concerning for COVID-19 infection (fever, chills, cough, or new SHORTNESS OF BREATH).   Patient has a past medical history of alcohol abuse,  cocaine marijuana,  anxiety/panic attacks,   seen in the hospital May 2020 for shortness of breath, elevated troponin Echocardiogram confirming ejection fraction 20% Who presents to establish care in the office, follow-up of his cardiomyopathy  echo 04/24/2019, results reviewed with him  1. Left ventricular ejection fraction, by visual estimation, is 40 to 45%. The left ventricle has mild to moderately decreased function. Normal left ventricular size. There is mildly increased left ventricular hypertrophy.Global hypokinesis.  2. Left ventricular diastolic Doppler parameters are consistent with impaired relaxation pattern of LV diastolic filling.  3. Global right ventricle has normal systolic function.The right ventricular size is normal. No increase in right ventricular wall thickness.  4. TR signal is inadequate for assessing pulmonary artery systolic pressure.  5. Left atrial size was normal.  Cath 11/2018  No significant coronary artery disease Elevated left ventricular  end-diastolic pressure Severely reduced left ventricular function, EF estimated 20%, global hypokinesis dilated LV   On today's visit reports feeling somewhat better, occasionally with shortness of breath and palpitations  Woke up with gout, Has no meds for it  Stopped cocaine Moderating his alcohol  Prior history of panic attacks, anxiety Possibly  exacerbated by polysubstance abuse, well as cardiomyopathy Previously on Klonopin, slept better  BuSpar did not seem to work for him Recommend psychiatry outpatient follow-up  EKG personally reviewed by myself on todays visit Shows normal sinus rhythm/sinus tachycardia rate 104 bpm ST-T wave abnormality V3 through V6, 1, 2, aVF  PMH:   has a past medical history of Alcohol abuse, Colon polyp (2016), Herpes, MI (myocardial infarction) (HCC), Polysubstance abuse (HCC), and Vitamin B 12 deficiency.  PSH:    Past Surgical History:  Procedure Laterality Date  . ANAL FISTULOTOMY  04/03/2016   Procedure: ANAL FISTULOTOMY;  Surgeon: Kieth Brightly, MD;  Location: [facility] ORS;  Service: General;;  . BLADDER SURGERY    . COLONOSCOPY  07/06/2015   Dr Shelle Iron  . KNEE ARTHROSCOPY Left   . LEFT HEART CATH AND CORONARY ANGIOGRAPHY N/A 12/19/2018   Procedure: LEFT HEART CATH AND CORONARY ANGIOGRAPHY;  Surgeon: Antonieta Iba, MD;  Location: [facility] INVASIVE CV LAB;  Service: Cardiovascular;  Laterality: N/A;  . UPPER GASTROINTESTINAL ENDOSCOPY  07/06/2015    Current Outpatient Medications  Medication Sig Dispense Refill  . aspirin 81 MG chewable tablet Chew 1 tablet (81 mg total) by mouth daily. 30 tablet 0  . ENTRESTO 24-26 MG TAKE 1 TABLET BY MOUTH TWICE A DAY 60 tablet 1  . furosemide (LASIX) 40 MG tablet Take 1 tablet (40 mg total) by mouth daily. 90 tablet 3  . Omega-3 Fatty Acids (FISH OIL) 1000 MG CPDR Take 1,000 mg by mouth daily.     Marland Kitchen oxyCODONE-acetaminophen (PERCOCET) 5-325 MG tablet Take 1 tablet by mouth every 6 (six) hours as needed for severe pain. 8 tablet 0  . spironolactone (ALDACTONE) 25  MG tablet Take 0.5 tablets (12.5 mg total) by mouth daily. 45 tablet 3  . vitamin B-12 (CYANOCOBALAMIN) 500 MCG tablet Take 500 mcg by mouth daily.    . Zinc Sulfate (ZINC-220 PO) Take 220 mg by mouth daily.      No current facility-administered medications for this visit.      Allergies:    Codeine   Social History:  The patient  reports that he has never smoked. He has never used smokeless tobacco. He reports current alcohol use. He reports current drug use. Drugs: Marijuana and Cocaine.   Family History:   family history includes CAD in his sister; Colon cancer in his maternal grandmother; Heart failure in his father; Leukemia in his paternal uncle; Prostate cancer in his paternal uncle.    Review of Systems: Review of Systems  Constitutional: Negative.   HENT: Negative.   Respiratory: Negative.   Cardiovascular: Negative.   Gastrointestinal: Negative.   Musculoskeletal: Negative.   Neurological: Negative.   Psychiatric/Behavioral: Negative.   All other systems reviewed and are negative.   PHYSICAL EXAM: VS:  BP 128/72 (BP Location: Left Arm, Patient Position: Sitting, Cuff Size: Normal)   Pulse (!) 104   Ht 6\' 3"  (1.905 m)   Wt 188 lb (85.3 kg)   BMI 23.50 kg/m  , BMI Body mass index is 23.5 kg/m. GEN: Well nourished, well developed, in no acute distress HEENT: normal Neck: no JVD, carotid bruits, or masses Cardiac: Regular, tachycardic; no murmurs, rubs, or gallops,no edema  Respiratory:  clear to auscultation bilaterally, normal work of breathing GI: soft, nontender, nondistended, + BS MS: no deformity or atrophy Pain in his toes bilaterally Skin: warm and dry, no rash Neuro:  Strength and sensation are intact Psych: euthymic mood, full affect   Recent Labs: 02/05/2019: BUN 11; Creatinine, Ser 1.21; Hemoglobin 13.7; Platelets 240; Potassium 2.8; Sodium 135    Lipid Panel Lab Results  Component Value Date   CHOL 174 12/17/2018   HDL 45 12/17/2018   LDLCALC UNABLE TO CALCULATE IF TRIGLYCERIDE OVER 400 mg/dL 12/17/2018   TRIG 457 (H) 12/17/2018      Wt Readings from Last 3 Encounters:  04/27/19 188 lb (85.3 kg)  03/12/19 191 lb 8 oz (86.9 kg)  02/05/19 193 lb (87.5 kg)      ASSESSMENT AND PLAN:  Problem List Items Addressed This Visit       Cardiology Problems   Dilated cardiomyopathy ([place]) - Primary     Other   Polysubstance abuse ([place])   Anxiety    Other Visit Diagnoses    Chronic systolic CHF (congestive heart failure) ([place])         Dilated cardiomyopathy Recommend he start carvedilol 6.25 mg twice daily for 1 week then up to 12.5 mg twice daily if tolerated Continue Entresto and spironolactone with diuretic Beta-blocker previously held given cocaine use Should be cleared to go back to work once he is tolerating the carvedilol, once gout symptoms improved  Gout We will start colchicine twice daily as needed with NSAIDs Follow-up with primary care  Polysubstance abuse Recommend cessation from alcohol and cocaine  Disposition:   F/U  3 months   Total encounter time more than 25 minutes  Greater than 50% was spent in counseling and coordination of care with the patient    Signed, Esmond Plants, M.D., Ph.D. [place], [place]

## 2019-04-27 ENCOUNTER — Ambulatory Visit (INDEPENDENT_AMBULATORY_CARE_PROVIDER_SITE_OTHER): Payer: No Typology Code available for payment source | Admitting: Cardiovascular Disease

## 2019-04-27 ENCOUNTER — Encounter: Payer: Self-pay | Admitting: Cardiovascular Disease

## 2019-04-27 ENCOUNTER — Telehealth: Payer: Self-pay | Admitting: Cardiovascular Disease

## 2019-04-27 ENCOUNTER — Other Ambulatory Visit: Payer: Self-pay | Admitting: Cardiovascular Disease

## 2019-04-27 ENCOUNTER — Other Ambulatory Visit: Payer: Self-pay

## 2019-04-27 VITALS — BP 128/72 | HR 104 | Ht 75.0 in | Wt 188.0 lb

## 2019-04-27 DIAGNOSIS — I5022 Chronic systolic (congestive) heart failure: Secondary | ICD-10-CM | POA: Diagnosis not present

## 2019-04-27 DIAGNOSIS — F1910 Other psychoactive substance abuse, uncomplicated: Secondary | ICD-10-CM

## 2019-04-27 DIAGNOSIS — M109 Gout, unspecified: Secondary | ICD-10-CM

## 2019-04-27 DIAGNOSIS — F419 Anxiety disorder, unspecified: Secondary | ICD-10-CM

## 2019-04-27 DIAGNOSIS — I420 Dilated cardiomyopathy: Secondary | ICD-10-CM

## 2019-04-27 MED ORDER — COLCHICINE 0.6 MG PO TABS: 0.6000 mg | ORAL_TABLET | Freq: Two times a day (BID) | ORAL | 1 refills | Status: DC | PRN

## 2019-04-27 MED ORDER — CARVEDILOL 12.5 MG PO TABS: 6.2500 mg | ORAL_TABLET | Freq: Two times a day (BID) | ORAL | 2 refills | Status: DC

## 2019-04-27 NOTE — Telephone Encounter (Signed)
Patient would like a phone call back regarding a prior authorization for Colchicine; this was a new Rx sent in today by Dr. Rockey Situ. The Colchicine is not on formulary.

## 2019-04-27 NOTE — Telephone Encounter (Signed)
Please advise if ok to refill Mitigare 0.6 mg qd not cardiac medication.

## 2019-04-27 NOTE — Patient Instructions (Addendum)
Medication Instructions:   1. START colchicine 0.6 mg twice a day as needed for gout  2. Take with naproxen or IBProfen over the counter  3. Start coreg 6.25 mg twice a day (1/2 pill twice a day) After one week if heart rate elevated, increase up to whole pill twice a day (12.5 mg twice a day)  If you need a refill on your cardiac medications before your next appointment, please call your pharmacy.    Lab work: [employer] today, for hypokalemia   If you have labs (blood work) drawn today and your tests are completely normal, you will receive your results only by: Marland Kitchen MyChart Message (if you have MyChart) OR . A paper copy in the mail If you have any lab test that is abnormal or we need to change your treatment, we will call you to review the results.   Testing/Procedures:  1. No new testing needed   Follow-Up: At [facility], you and your health needs are our priority.  As part of our continuing mission to provide you with exceptional heart care, we have created designated Provider Care Teams.  These Care Teams include your primary Cardiologist (physician) and Advanced Practice Providers (APPs -  Physician Assistants and Nurse Practitioners) who all work together to provide you with the care you need, when you need it.  . You will need a follow up appointment in 3 months .   Please call our office 2 months in advance to schedule this appointment.    . Providers on your designated Care Team:   . Murray Hodgkins, NP . Christell Faith, PA-C . Marrianne Mood, PA-C  Any Other Special Instructions Will Be Listed Below (If Applicable).  For educational health videos Log in to : [email or web address] Or : [email or web address], password : [email or web address]

## 2019-04-27 NOTE — Telephone Encounter (Signed)
Patient states his insurance company needs to speak to Edward [name] regarding Colchicine. He is not sure what needs to be discussed. Please call.

## 2019-04-27 NOTE — Telephone Encounter (Signed)
Pt c/o medication issue:  1. Name of Medication COLCHICINE    2. How are you currently taking this medication (dosage and times per day)? 0.6 MG PO bid PRN  3. Are you having a reaction (difficulty breathing--STAT)? NO   4. What is your medication issue? Insurance needs prior auth patient prefers this asap as he is in a lot of pain.  Please call to discuss

## 2019-04-27 NOTE — Telephone Encounter (Signed)
Error- Mitigare 0.6 mg bid prn.

## 2019-04-28 LAB — BASIC METABOLIC PANEL
BUN/Creatinine Ratio: 11 (ref 9–20)
BUN: 13 mg/dL (ref 6–24)
CO2: 19 mmol/L — ABNORMAL LOW (ref 20–29)
Calcium: 10.3 mg/dL — ABNORMAL HIGH (ref 8.7–10.2)
Chloride: 97 mmol/L (ref 96–106)
Creatinine, Ser: 1.16 mg/dL (ref 0.76–1.27)
GFR calc Af Amer: 81 mL/min/{1.73_m2} (ref >59)
GFR calc non Af Amer: 70 mL/min/{1.73_m2} (ref >59)
Glucose: 99 mg/dL (ref 65–99)
Potassium: 4.8 mmol/L (ref 3.5–5.2)
Sodium: 135 mmol/L (ref 134–144)

## 2019-04-28 NOTE — Telephone Encounter (Signed)
Spoke with pharmacy staff at patient's [facility].  States the colchicine is not covered and needs prior authorization.  We need to call [phone] to initiate the prior authorization.

## 2019-04-29 NOTE — Telephone Encounter (Signed)
Call to patient to discuss results. No answer, mailbox full.

## 2019-04-29 NOTE — Telephone Encounter (Signed)
-----   Message from Minna Merritts, MD sent at 04/29/2019  3:10 PM EDT ----- BMP Dramatic improvement in potassium, stay on current regiment

## 2019-04-30 NOTE — Telephone Encounter (Signed)
Called [phone] and initiated prior authorization for colchicine. Reference # [id number]. Awaiting determination.   Attempted to reach patient on mobile and went to VM. Unable to leave message because VM full. Called home number and no answer/VM after several rings.

## 2019-05-01 ENCOUNTER — Telehealth: Payer: Self-pay | Admitting: Cardiovascular Disease

## 2019-05-01 NOTE — Telephone Encounter (Signed)
Prior authorization per [employer] has denied for Colchicine 0.6 mg tab.   The medication is covered only if:  You require a reduced dose of 0.3 mg (half of a 0.6 mg tablet) due to one of the following criteria.  A. Severe renal impairment(for example, estimated creatinine clearance less that 30 mL/min).   B. Severe hepatic impairment ( example, child-pugh score of B or C). C. Concomitant use of a CYP3A4 inhibitor (for example, clarithromycin, itraconazole), P- glycoprotein.  D. The treatment of familial Mediterranean fever.  E. Intolerable side effects that cannot be managed by extending the dosing interval.  The information providie does not show that you meet the criteria listed above.   Please review above for an appeals process. If there is a alternative medication you can prescribe, please contact the patient with instructions.

## 2019-05-01 NOTE — Telephone Encounter (Signed)
Patient calling  States that his disability has been cut off and he needs to be cleared or released to go back to work, which he does not feel ready to do  Asked if we could call [phone] - Disability Claims at [facility] Please call to discuss

## 2019-05-03 NOTE — Telephone Encounter (Signed)
We don't perform outreach to disability offices We can fill out paperwork for disability if he has any to extend benefits He can drop these off in the office

## 2019-05-04 ENCOUNTER — Telehealth: Payer: Self-pay | Admitting: Cardiovascular Disease

## 2019-05-04 NOTE — Telephone Encounter (Signed)
Patient calling to check on status Would like to know when he will be able to start rehab States he will be needing 3 days a week for 12 weeks Please call to discuss

## 2019-05-04 NOTE — Telephone Encounter (Signed)
Attempted outreach to Pt.  Call went to VM.  VM was full, unable to leave message.

## 2019-05-04 NOTE — Telephone Encounter (Signed)
Please call regarding rehab. Patient would like to know if he truly needs this.

## 2019-05-04 NOTE — Telephone Encounter (Signed)
I spoke to the patient and informed him that someone will contact him on the start date for his rehab.  He verbalized understanding.

## 2019-05-04 NOTE — Telephone Encounter (Signed)
Call back received from Pt.  Per Pt he just got off the phone with the disability office and they stated they would be calling the [place] office to speak with Dr. Rockey Situ.    Will await further needs from disability office.

## 2019-05-04 NOTE — Telephone Encounter (Signed)
Spoke to Pt, he did not have any questions about this.

## 2019-05-04 NOTE — Telephone Encounter (Signed)
Patient wants to know when he can start rehab.

## 2019-05-05 ENCOUNTER — Telehealth: Payer: Self-pay | Admitting: Cardiovascular Disease

## 2019-05-05 NOTE — Telephone Encounter (Signed)
Cardiac function dramatically improved Should be able to proceed with going back to work If work allows him to have time off for cardiac rehab he is welcome to do this

## 2019-05-05 NOTE — Telephone Encounter (Signed)
No answer on patient's number and VM is full. Tried the [phone] and no answer or VM after several rings.

## 2019-05-05 NOTE — Telephone Encounter (Signed)
Received request from [place] claims to be completed Placed in interoffice mail and sent to [facility]

## 2019-05-06 NOTE — Telephone Encounter (Addendum)
I attempted to call [facility] to check on what is going on with the the patients referral to Cardiac Rehab. It looks like a referral was placed from the Cath lab on 7/22. I cannot tell if Cardiac Rehab has tried to reach out to the patient or what is going on with this.  No answer at Cardiac Rehab. I left a message for someone to please call back to discuss further.

## 2019-05-07 ENCOUNTER — Telehealth: Payer: Self-pay | Admitting: Cardiovascular Disease

## 2019-05-07 NOTE — Telephone Encounter (Signed)
Patient BP is now 68/40

## 2019-05-07 NOTE — Telephone Encounter (Signed)
Pt reports that he had syncopal episode x 2 from standing. He "saw white light, like half moon" and fell. Attempted to stand and passed out again. BP 60/40, HR 87.  Pt reports medication was recently increased (carvediolol). Pt attempting to give more details, I interrupted him and asked for him to call for an ambulance. Pt verbalized understanding and agreed to POC.   Dr. Rockey Situ on call for hospital. I messaged him to make him aware pt en route to ED.

## 2019-05-07 NOTE — Telephone Encounter (Signed)
Pt c/o Syncope: STAT if syncope occurred within 30 minutes and pt complains of lightheadedness High Priority if episode of passing out, completely, today or in last 24 hours   1. Did you pass out today? Yes   2. When is the last time you passed out? A few minutes ago   3. Has this occurred multiple times? Yes twice today   4. Did you have any symptoms prior to passing out?  No

## 2019-05-07 NOTE — Telephone Encounter (Signed)
Chart review to see if patient arrived in ED.   No arrival information thus far.   Call attempted to patient, went straight to VM. Mailbox full.

## 2019-05-07 NOTE — Telephone Encounter (Signed)
Rehab returning call.  Unable to contact patient to schedule per rehab.  LMOV to call for scheduling.  They will continue to try to contact patient .  If further assistance needed please call.

## 2019-05-07 NOTE — Telephone Encounter (Signed)
Attempted to call the patient at his primary number regarding his Cardiac Rehab.. No answer- unable to leave a message as his voice mail box is full.

## 2019-05-08 NOTE — Telephone Encounter (Signed)
Attempted call to patient again as I see he did not go to ED. Spoke to SO who said she would give him the message again but reported "he said he ate something and felt better after. He said blood pressure got better as well".

## 2019-05-08 NOTE — Telephone Encounter (Signed)
Call attempted, no VM available  

## 2019-05-08 NOTE — Telephone Encounter (Signed)
Patient did not go to the emergency room .  He says he feels better and thinks Sugar dropped.

## 2019-05-10 ENCOUNTER — Other Ambulatory Visit: Payer: Self-pay | Admitting: Cardiovascular Disease

## 2019-05-11 NOTE — Telephone Encounter (Signed)
Patient calling to let us know rehab will not schedule until they receive and order.

## 2019-05-11 NOTE — Telephone Encounter (Signed)
Call to speak with patient, I let him know that order is in and is active. No further questions at this time.   Advised pt to call for any further questions or concerns.

## 2019-05-15 ENCOUNTER — Encounter: Payer: Self-pay | Admitting: *Deleted

## 2019-05-15 ENCOUNTER — Other Ambulatory Visit: Payer: Self-pay

## 2019-05-15 ENCOUNTER — Encounter: Payer: No Typology Code available for payment source | Attending: Cardiovascular Disease | Admitting: *Deleted

## 2019-05-15 DIAGNOSIS — I252 Old myocardial infarction: Secondary | ICD-10-CM | POA: Insufficient documentation

## 2019-05-15 DIAGNOSIS — I5022 Chronic systolic (congestive) heart failure: Secondary | ICD-10-CM | POA: Insufficient documentation

## 2019-05-15 DIAGNOSIS — Z79899 Other long term (current) drug therapy: Secondary | ICD-10-CM | POA: Insufficient documentation

## 2019-05-15 DIAGNOSIS — Z7982 Long term (current) use of aspirin: Secondary | ICD-10-CM | POA: Insufficient documentation

## 2019-05-15 DIAGNOSIS — Z7901 Long term (current) use of anticoagulants: Secondary | ICD-10-CM | POA: Insufficient documentation

## 2019-05-15 NOTE — Progress Notes (Signed)
Telephone orientation completed today. Has apt on Mon 10/19 for EP/RD evals and gym orientation.  Documentation of diagnosis can be found in [facility] 12/17/2018

## 2019-05-18 ENCOUNTER — Telehealth: Payer: Self-pay | Admitting: Cardiovascular Disease

## 2019-05-18 ENCOUNTER — Encounter: Payer: No Typology Code available for payment source | Admitting: *Deleted

## 2019-05-18 ENCOUNTER — Other Ambulatory Visit: Payer: Self-pay

## 2019-05-18 VITALS — Ht 73.6 in | Wt 193.0 lb

## 2019-05-18 DIAGNOSIS — Z7901 Long term (current) use of anticoagulants: Secondary | ICD-10-CM | POA: Diagnosis not present

## 2019-05-18 DIAGNOSIS — Z79899 Other long term (current) drug therapy: Secondary | ICD-10-CM | POA: Diagnosis not present

## 2019-05-18 DIAGNOSIS — Z7982 Long term (current) use of aspirin: Secondary | ICD-10-CM | POA: Diagnosis not present

## 2019-05-18 DIAGNOSIS — I5022 Chronic systolic (congestive) heart failure: Secondary | ICD-10-CM | POA: Diagnosis present

## 2019-05-18 DIAGNOSIS — I252 Old myocardial infarction: Secondary | ICD-10-CM | POA: Diagnosis not present

## 2019-05-18 NOTE — Patient Instructions (Signed)
Patient Instructions  Patient Details  Name: Edward [name] MRN: [id number] Date of Birth: Jul 30, 1966 Referring Provider:  Antonieta Iba, MD  Below are your personal goals for exercise, nutrition, and risk factors. Our goal is to help you stay on track towards obtaining and maintaining these goals. We will be discussing your progress on these goals with you throughout the program.  Initial Exercise Prescription: Initial Exercise Prescription - 05/18/19 1000      Date of Initial Exercise RX and Referring Provider   Date  05/18/19    Referring Provider  Julien Nordmann MD      Treadmill   MPH  2.5    Grade  2    Minutes  15    METs  3.5      NuStep   Level  3    SPM  80    Minutes  15    METs  3      Recumbant Elliptical   Level  2    RPM  50    Minutes  15    METs  3      Elliptical   Level  1    Speed  3    Minutes  15      REL-XR   Level  3    Speed  50    Minutes  15    METs  3      T5 Nustep   Level  3    SPM  80    Minutes  15    METs  3      Prescription Details   Frequency (times per week)  3    Duration  Progress to 30 minutes of continuous aerobic without signs/symptoms of physical distress      Intensity   THRR 40-80% of Max Heartrate  106-145    Ratings of Perceived Exertion  11-13    Perceived Dyspnea  0-4      Progression   Progression  Continue to progress workloads to maintain intensity without signs/symptoms of physical distress.      Resistance Training   Training Prescription  Yes    Weight  6 lbs    Reps  10-15       Exercise Goals: Frequency: Be able to perform aerobic exercise two to three times per week in program working toward 2-5 days per week of home exercise.  Intensity: Work with a perceived exertion of 11 (fairly light) - 15 (hard) while following your exercise prescription.  We will make changes to your prescription with you as you progress through the program.   Duration: Be able to do 30 to 45 minutes of  continuous aerobic exercise in addition to a 5 minute warm-up and a 5 minute cool-down routine.   Nutrition Goals: Your personal nutrition goals will be established when you do your nutrition analysis with the dietician.  The following are general nutrition guidelines to follow: Cholesterol < 200mg /day Sodium < 1500mg /day Fiber: Men over 50 yrs - 30 grams per day  Personal Goals: Personal Goals and Risk Factors at Admission - 05/18/19 1043      Core Components/Risk Factors/Patient Goals on Admission    Weight Management  Yes;Weight Loss    Intervention  Weight Management: Develop a combined nutrition and exercise program designed to reach desired caloric intake, while maintaining appropriate intake of nutrient and fiber, sodium and fats, and appropriate energy expenditure required for the weight goal.;Weight Management: Provide education and appropriate  resources to help participant work on and attain dietary goals.    Admit Weight  193 lb (87.5 kg)    Goal Weight: Short Term  190 lb (86.2 kg)    Goal Weight: Long Term  188 lb (85.3 kg)    Expected Outcomes  Short Term: Continue to assess and modify interventions until short term weight is achieved;Long Term: Adherence to nutrition and physical activity/exercise program aimed toward attainment of established weight goal;Weight Loss: Understanding of general recommendations for a balanced deficit meal plan, which promotes 1-2 lb weight loss per week and includes a negative energy balance of [phone] kcal/d;Understanding recommendations for meals to include 15-35% energy as protein, 25-35% energy from fat, 35-60% energy from carbohydrates, less than 200mg  of dietary cholesterol, 20-35 gm of total fiber daily    Heart Failure  Yes    Intervention  Provide a combined exercise and nutrition program that is supplemented with education, support and counseling about heart failure. Directed toward relieving symptoms such as shortness of breath, decreased  exercise tolerance, and extremity edema.    Expected Outcomes  Improve functional capacity of life;Short term: Attendance in program 2-3 days a week with increased exercise capacity. Reported lower sodium intake. Reported increased fruit and vegetable intake. Reports medication compliance.;Short term: Daily weights obtained and reported for increase. Utilizing diuretic protocols set by physician.;Long term: Adoption of self-care skills and reduction of barriers for early signs and symptoms recognition and intervention leading to self-care maintenance.    Hypertension  Yes    Intervention  Provide education on lifestyle modifcations including regular physical activity/exercise, weight management, moderate sodium restriction and increased consumption of fresh fruit, vegetables, and low fat dairy, alcohol moderation, and smoking cessation.;Monitor prescription use compliance.    Expected Outcomes  Short Term: Continued assessment and intervention until BP is < 140/90mm HG in hypertensive participants. < 130/80mm HG in hypertensive participants with diabetes, heart failure or chronic kidney disease.;Long Term: Maintenance of blood pressure at goal levels.       Tobacco Use Initial Evaluation: Social History   Tobacco Use  Smoking Status Never Smoker  Smokeless Tobacco Never Used    Exercise Goals and Review: Exercise Goals    Row Name 05/18/19 1038             Exercise Goals   Increase Physical Activity  Yes       Intervention  Provide advice, education, support and counseling about physical activity/exercise needs.;Develop an individualized exercise prescription for aerobic and resistive training based on initial evaluation findings, risk stratification, comorbidities and participant's personal goals.       Expected Outcomes  Short Term: Attend rehab on a regular basis to increase amount of physical activity.;Long Term: Add in home exercise to make exercise part of routine and to increase  amount of physical activity.;Long Term: Exercising regularly at least 3-5 days a week.       Increase Strength and Stamina  Yes       Intervention  Provide advice, education, support and counseling about physical activity/exercise needs.;Develop an individualized exercise prescription for aerobic and resistive training based on initial evaluation findings, risk stratification, comorbidities and participant's personal goals.       Expected Outcomes  Short Term: Perform resistance training exercises routinely during rehab and add in resistance training at home;Short Term: Increase workloads from initial exercise prescription for resistance, speed, and METs.;Long Term: Improve cardiorespiratory fitness, muscular endurance and strength as measured by increased METs and functional capacity (<MEASUREME<MEASUREMENT<MEASUREMENT<MEASUREMENT T>)  Able to understand and use rate of perceived exertion (RPE) scale  Yes       Intervention  Provide education and explanation on how to use RPE scale       Expected Outcomes  Short Term: Able to use RPE daily in rehab to express subjective intensity level;Long Term:  Able to use RPE to guide intensity level when exercising independently       Knowledge and understanding of Target Heart Rate Range (THRR)  Yes       Intervention  Provide education and explanation of THRR including how the numbers were predicted and where they are located for reference       Expected Outcomes  Short Term: Able to state/look up THRR;Short Term: Able to use daily as guideline for intensity in rehab;Long Term: Able to use THRR to govern intensity when exercising independently       Able to check pulse independently  Yes       Intervention  Provide education and demonstration on how to check pulse in carotid and radial arteries.;Review the importance of being able to check your own pulse for safety during independent exercise       Expected Outcomes  Short Term: Able to explain why pulse checking is important during independent  exercise;Long Term: Able to check pulse independently and accurately       Understanding of Exercise Prescription  Yes       Intervention  Provide education, explanation, and written materials on patient's individual exercise prescription       Expected Outcomes  Short Term: Able to explain program exercise prescription;Long Term: Able to explain home exercise prescription to exercise independently          Copy of goals given to participant.

## 2019-05-18 NOTE — Progress Notes (Signed)
Cardiac Individual Treatment Plan  Patient Details  Name: Edward [name] MRN: [id number] Date of Birth: 07-29-1966 Referring Provider:     Cardiac Rehab from 05/18/2019 in [facility] Cardiac and Pulmonary Rehab  Referring Provider  Ida Rogue MD      Initial Encounter Date:    Cardiac Rehab from 05/18/2019 in [facility] Cardiac and Pulmonary Rehab  Date  05/18/19      Visit Diagnosis: Heart failure, chronic systolic ([place])  Patient's Home Medications on Admission:  Current Outpatient Medications:  .  aspirin 81 MG chewable tablet, Chew 1 tablet (81 mg total) by mouth daily., Disp: 30 tablet, Rfl: 0 .  carvedilol (COREG) 12.5 MG tablet, Take 0.5 tablets (6.25 mg total) by mouth 2 (two) times daily., Disp: 180 tablet, Rfl: 2 .  colchicine 0.6 MG tablet, Take 1 tablet (0.6 mg total) by mouth 2 (two) times daily as needed., Disp: 30 tablet, Rfl: 1 .  ENTRESTO 24-26 MG, TAKE 1 TABLET BY MOUTH TWICE A DAY, Disp: 60 tablet, Rfl: 2 .  furosemide (LASIX) 40 MG tablet, Take 1 tablet (40 mg total) by mouth daily., Disp: 90 tablet, Rfl: 3 .  Omega-3 Fatty Acids (FISH OIL) 1000 MG CPDR, Take 1,000 mg by mouth daily. , Disp: , Rfl:  .  oxyCODONE-acetaminophen (PERCOCET) 5-325 MG tablet, Take 1 tablet by mouth every 6 (six) hours as needed for severe pain. (Patient not taking: Reported on 05/15/2019), Disp: 8 tablet, Rfl: 0 .  spironolactone (ALDACTONE) 25 MG tablet, Take 0.5 tablets (12.5 mg total) by mouth daily., Disp: 45 tablet, Rfl: 3 .  vitamin B-12 (CYANOCOBALAMIN) 500 MCG tablet, Take 500 mcg by mouth daily., Disp: , Rfl:  .  Zinc Sulfate (ZINC-220 PO), Take 220 mg by mouth daily. , Disp: , Rfl:   Past Medical History: Past Medical History:  Diagnosis Date  . Alcohol abuse   . Colon polyp 2016  . Herpes   . MI (myocardial infarction) ([place])   . Polysubstance abuse ([place])   . Vitamin B 12 deficiency     Tobacco Use: Social History   Tobacco Use  Smoking Status Never Smoker  Smokeless  Tobacco Never Used    Labs: Recent Review Scientist, physiological    Labs for ITP Cardiac and Pulmonary Rehab Latest Ref Rng & Units 12/17/2018   Cholestrol 0 - 200 mg/dL 174   LDLCALC 0 - 99 mg/dL UNABLE TO CALCULATE IF TRIGLYCERIDE OVER 400 mg/dL   LDLDIRECT 0 - 99 mg/dL 72.0   HDL >40 mg/dL 45   Trlycerides <150 mg/dL 457(H)   Hemoglobin A1c 4.8 - 5.6 % 5.6       Exercise Target Goals: Exercise Program Goal: Individual exercise prescription set using results from initial 6 min walk test and THRR while considering  patient's activity barriers and safety.   Exercise Prescription Goal: Initial exercise prescription builds to 30-45 minutes a day of aerobic activity, 2-3 days per week.  Home exercise guidelines will be given to patient during program as part of exercise prescription that the participant will acknowledge.  Activity Barriers & Risk Stratification: Activity Barriers & Cardiac Risk Stratification - 05/18/19 1036      Activity Barriers & Cardiac Risk Stratification   Activity Barriers  Joint Problems;Decreased Ventricular Function;Deconditioning;History of Falls;Muscular Weakness   L knee pain on occasion   Cardiac Risk Stratification  High       6 Minute Walk: 6 Minute Walk    Row Name 05/18/19 1035  6 Minute Walk   Phase  Initial     Distance  1370 feet     Walk Time  6 minutes     # of Rest Breaks  0     MPH  2.6     METS  4.16     RPE  9     VO2 Peak  14.55     Symptoms  No     Resting HR  67 bpm     Resting BP  142/76     Resting Oxygen Saturation   97 %     Exercise Oxygen Saturation  during 6 min walk  96 %     Max Ex. HR  89 bpm     Max Ex. BP  154/70     2 Minute Post BP  134/70        Oxygen Initial Assessment:   Oxygen Re-Evaluation:   Oxygen Discharge (Final Oxygen Re-Evaluation):   Initial Exercise Prescription: Initial Exercise Prescription - 05/18/19 1000      Date of Initial Exercise RX and Referring Provider   Date   05/18/19    Referring Provider  Ida Rogue MD      Treadmill   MPH  2.5    Grade  2    Minutes  15    METs  3.5      NuStep   Level  3    SPM  80    Minutes  15    METs  3      Recumbant Elliptical   Level  2    RPM  50    Minutes  15    METs  3      Elliptical   Level  1    Speed  3    Minutes  15      REL-XR   Level  3    Speed  50    Minutes  15    METs  3      T5 Nustep   Level  3    SPM  80    Minutes  15    METs  3      Prescription Details   Frequency (times per week)  3    Duration  Progress to 30 minutes of continuous aerobic without signs/symptoms of physical distress      Intensity   THRR 40-80% of Max Heartrate  106-145    Ratings of Perceived Exertion  11-13    Perceived Dyspnea  0-4      Progression   Progression  Continue to progress workloads to maintain intensity without signs/symptoms of physical distress.      Resistance Training   Training Prescription  Yes    Weight  6 lbs    Reps  10-15       Perform Capillary Blood Glucose checks as needed.  Exercise Prescription Changes: Exercise Prescription Changes    Row Name 05/18/19 1000             Response to Exercise   Blood Pressure (Admit)  142/76       Blood Pressure (Exercise)  154/70       Blood Pressure (Exit)  134/72       Heart Rate (Admit)  67 bpm       Heart Rate (Exercise)  89 bpm       Heart Rate (Exit)  73 bpm       Oxygen Saturation (Admit)  97 %       Oxygen Saturation (Exercise)  96 %       Rating of Perceived Exertion (Exercise)  9       Symptoms  none       Comments  walk test results          Exercise Comments:   Exercise Goals and Review: Exercise Goals    Row Name 05/18/19 1038             Exercise Goals   Increase Physical Activity  Yes       Intervention  Provide advice, education, support and counseling about physical activity/exercise needs.;Develop an individualized exercise prescription for aerobic and resistive training based  on initial evaluation findings, risk stratification, comorbidities and participant's personal goals.       Expected Outcomes  Short Term: Attend rehab on a regular basis to increase amount of physical activity.;Long Term: Add in home exercise to make exercise part of routine and to increase amount of physical activity.;Long Term: Exercising regularly at least 3-5 days a week.       Increase Strength and Stamina  Yes       Intervention  Provide advice, education, support and counseling about physical activity/exercise needs.;Develop an individualized exercise prescription for aerobic and resistive training based on initial evaluation findings, risk stratification, comorbidities and participant's personal goals.       Expected Outcomes  Short Term: Perform resistance training exercises routinely during rehab and add in resistance training at home;Short Term: Increase workloads from initial exercise prescription for resistance, speed, and METs.;Long Term: Improve cardiorespiratory fitness, muscular endurance and strength as measured by increased METs and functional capacity (6MWT)       Able to understand and use rate of perceived exertion (RPE) scale  Yes       Intervention  Provide education and explanation on how to use RPE scale       Expected Outcomes  Short Term: Able to use RPE daily in rehab to express subjective intensity level;Long Term:  Able to use RPE to guide intensity level when exercising independently       Knowledge and understanding of Target Heart Rate Range (THRR)  Yes       Intervention  Provide education and explanation of THRR including how the numbers were predicted and where they are located for reference       Expected Outcomes  Short Term: Able to state/look up THRR;Short Term: Able to use daily as guideline for intensity in rehab;Long Term: Able to use THRR to govern intensity when exercising independently       Able to check pulse independently  Yes       Intervention  Provide  education and demonstration on how to check pulse in carotid and radial arteries.;Review the importance of being able to check your own pulse for safety during independent exercise       Expected Outcomes  Short Term: Able to explain why pulse checking is important during independent exercise;Long Term: Able to check pulse independently and accurately       Understanding of Exercise Prescription  Yes       Intervention  Provide education, explanation, and written materials on patient's individual exercise prescription       Expected Outcomes  Short Term: Able to explain program exercise prescription;Long Term: Able to explain home exercise prescription to exercise independently          Exercise Goals Re-Evaluation :   Discharge Exercise  Prescription (Final Exercise Prescription Changes): Exercise Prescription Changes - 05/18/19 1000      Response to Exercise   Blood Pressure (Admit)  142/76    Blood Pressure (Exercise)  154/70    Blood Pressure (Exit)  134/72    Heart Rate (Admit)  67 bpm    Heart Rate (Exercise)  89 bpm    Heart Rate (Exit)  73 bpm    Oxygen Saturation (Admit)  97 %    Oxygen Saturation (Exercise)  96 %    Rating of Perceived Exertion (Exercise)  9    Symptoms  none    Comments  walk test results       Nutrition:  Target Goals: Understanding of nutrition guidelines, daily intake of sodium '1500mg'$ , cholesterol '200mg'$ , calories 30% from fat and 7% or less from saturated fats, daily to have 5 or more servings of fruits and vegetables.  Biometrics: Pre Biometrics - 05/18/19 1039      Pre Biometrics   Height  6' 1.6" (1.869 m)    Weight  193 lb (87.5 kg)    BMI (Calculated)  25.06    Single Leg Stand  30 seconds        Nutrition Therapy Plan and Nutrition Goals:   Nutrition Assessments: Nutrition Assessments - 05/18/19 1042      MEDFICTS Scores   Pre Score  61       Nutrition Goals Re-Evaluation:   Nutrition Goals Discharge (Final Nutrition  Goals Re-Evaluation):   Psychosocial: Target Goals: Acknowledge presence or absence of significant depression and/or stress, maximize coping skills, provide positive support system. Participant is able to verbalize types and ability to use techniques and skills needed for reducing stress and depression.   Initial Review & Psychosocial Screening: Initial Psych Review & Screening - 05/15/19 1124      Initial Review   Current issues with  None Identified      Family Dynamics   Good Support System?  Yes   Friends      does live alone, sister and her husband     Barriers   Psychosocial barriers to participate in program  There are no identifiable barriers or psychosocial needs.      Screening Interventions   Interventions  Encouraged to exercise    Expected Outcomes  Short Term goal: Utilizing psychosocial counselor, staff and physician to assist with identification of specific Stressors or current issues interfering with healing process. Setting desired goal for each stressor or current issue identified.;Long Term Goal: Stressors or current issues are controlled or eliminated.;Short Term goal: Identification and review with participant of any Quality of Life or Depression concerns found by scoring the questionnaire.;Long Term goal: The participant improves quality of Life and PHQ9 Scores as seen by post scores and/or verbalization of changes       Quality of Life Scores:  Quality of Life - 05/18/19 1041      Quality of Life   Select  Quality of Life      Quality of Life Scores   Health/Function Pre  22.21 %    Socioeconomic Pre  18.69 %    Psych/Spiritual Pre  24.21 %    Family Pre  17.63 %    GLOBAL Pre  21.23 %      Scores of 19 and below usually indicate a poorer quality of life in these areas.  A difference of  2-3 points is a clinically meaningful difference.  A difference of 2-3 points in the total  score of the Quality of Life Index has been associated with significant  improvement in overall quality of life, self-image, physical symptoms, and general health in studies assessing change in quality of life.  PHQ-9: Recent Review Flowsheet Data    Depression screen [facility] 2/9 05/18/2019   Decreased Interest 0   Down, Depressed, Hopeless 1   PHQ - 2 Score 1   Altered sleeping 2   Tired, decreased energy 0   Change in appetite 0   Feeling bad or failure about yourself  0   Trouble concentrating 0   Moving slowly or fidgety/restless 0   Suicidal thoughts 0   PHQ-9 Score 3     Interpretation of Total Score  Total Score Depression Severity:  1-4 = Minimal depression, 5-9 = Mild depression, 10-14 = Moderate depression, 15-19 = Moderately severe depression, 20-27 = Severe depression   Psychosocial Evaluation and Intervention: Psychosocial Evaluation - 05/15/19 1142      Psychosocial Evaluation & Interventions   Comments  Edward [name] has no voiced concerns with depression, sress or anxiety. He is eager to start the program so he can feel better.  He does have good days and bad days.  Edward [name] lives alone and has friends and family nearby that are there to help him if needed. He has a positive attitude about starting the program.    Expected Outcomes  STG: Edward [name] will attend all sessions  LTG Edward [name] will be able to take the resources and education provided to continue to live a heart healthy life.    Continue Psychosocial Services   Follow up required by staff       Psychosocial Re-Evaluation:   Psychosocial Discharge (Final Psychosocial Re-Evaluation):   Vocational Rehabilitation: Provide vocational rehab assistance to qualifying candidates.   Vocational Rehab Evaluation & Intervention: Vocational Rehab - 05/15/19 1126      Initial Vocational Rehab Evaluation & Intervention   Assessment shows need for Vocational Rehabilitation  No       Education: Education Goals: Education classes will be provided on a variety of topics geared toward better understanding of  heart health and risk factor modification. Participant will state understanding/return demonstration of topics presented as noted by education test scores.  Learning Barriers/Preferences: Learning Barriers/Preferences - 05/15/19 1126      Learning Barriers/Preferences   Learning Barriers  None    Learning Preferences  None       Education Topics:  AED/CPR: - Group verbal and written instruction with the use of models to demonstrate the basic use of the AED with the basic ABC's of resuscitation.   General Nutrition Guidelines/Fats and Fiber: -Group instruction provided by verbal, written material, models and posters to present the general guidelines for heart healthy nutrition. Gives an explanation and review of dietary fats and fiber.   Controlling Sodium/Reading Food Labels: -Group verbal and written material supporting the discussion of sodium use in heart healthy nutrition. Review and explanation with models, verbal and written materials for utilization of the food label.   Exercise Physiology & General Exercise Guidelines: - Group verbal and written instruction with models to review the exercise physiology of the cardiovascular system and associated critical values. Provides general exercise guidelines with specific guidelines to those with heart or lung disease.    Aerobic Exercise & Resistance Training: - Gives group verbal and written instruction on the various components of exercise. Focuses on aerobic and resistive training programs and the benefits of this training and how to safely progress through these  programs..   Flexibility, Balance, Mind/Body Relaxation: Provides group verbal/written instruction on the benefits of flexibility and balance training, including mind/body exercise modes such as yoga, pilates and tai chi.  Demonstration and skill practice provided.   Stress and Anxiety: - Provides group verbal and written instruction about the health risks of elevated  stress and causes of high stress.  Discuss the correlation between heart/lung disease and anxiety and treatment options. Review healthy ways to manage with stress and anxiety.   Depression: - Provides group verbal and written instruction on the correlation between heart/lung disease and depressed mood, treatment options, and the stigmas associated with seeking treatment.   Anatomy & Physiology of the Heart: - Group verbal and written instruction and models provide basic cardiac anatomy and physiology, with the coronary electrical and arterial systems. Review of Valvular disease and Heart Failure   Cardiac Procedures: - Group verbal and written instruction to review commonly prescribed medications for heart disease. Reviews the medication, class of the drug, and side effects. Includes the steps to properly store meds and maintain the prescription regimen. (beta blockers and nitrates)   Cardiac Medications I: - Group verbal and written instruction to review commonly prescribed medications for heart disease. Reviews the medication, class of the drug, and side effects. Includes the steps to properly store meds and maintain the prescription regimen.   Cardiac Medications II: -Group verbal and written instruction to review commonly prescribed medications for heart disease. Reviews the medication, class of the drug, and side effects. (all other drug classes)    Go Sex-Intimacy & Heart Disease, Get SMART - Goal Setting: - Group verbal and written instruction through game format to discuss heart disease and the return to sexual intimacy. Provides group verbal and written material to discuss and apply goal setting through the application of the S.M.A.R.T. Method.   Other Matters of the Heart: - Provides group verbal, written materials and models to describe Stable Angina and Peripheral Artery. Includes description of the disease process and treatment options available to the cardiac  patient.   Exercise & Equipment Safety: - Individual verbal instruction and demonstration of equipment use and safety with use of the equipment.   Cardiac Rehab from 05/18/2019 in [facility] Cardiac and Pulmonary Rehab  Date  05/18/19  Educator  [facility]  Instruction Review Code  1- Verbalizes Understanding      Infection Prevention: - Provides verbal and written material to individual with discussion of infection control including proper hand washing and proper equipment cleaning during exercise session.   Cardiac Rehab from 05/18/2019 in [facility] Cardiac and Pulmonary Rehab  Date  05/18/19  Educator  [facility]  Instruction Review Code  1- Verbalizes Understanding      Falls Prevention: - Provides verbal and written material to individual with discussion of falls prevention and safety.   Cardiac Rehab from 05/18/2019 in [facility] Cardiac and Pulmonary Rehab  Date  05/18/19  Educator  [place]  Instruction Review Code  1- Verbalizes Understanding      Diabetes: - Individual verbal and written instruction to review signs/symptoms of diabetes, desired ranges of glucose level fasting, after meals and with exercise. Acknowledge that pre and post exercise glucose checks will be done for 3 sessions at entry of program.   Know Your Numbers and Risk Factors: -Group verbal and written instruction about important numbers in your health.  Discussion of what are risk factors and how they play a role in the disease process.  Review of Cholesterol, Blood Pressure, Diabetes, and  BMI and the role they play in your overall health.   Sleep Hygiene: -Provides group verbal and written instruction about how sleep can affect your health.  Define sleep hygiene, discuss sleep cycles and impact of sleep habits. Review good sleep hygiene tips.    Other: -Provides group and verbal instruction on various topics (see comments)   Knowledge Questionnaire Score: Knowledge Questionnaire Score - 05/18/19 1043      Knowledge  Questionnaire Score   Pre Score  24/26 Education Focus: Nutrition, Heart Failure       Core Components/Risk Factors/Patient Goals at Admission: Personal Goals and Risk Factors at Admission - 05/18/19 1043      Core Components/Risk Factors/Patient Goals on Admission    Weight Management  Yes;Weight Loss    Intervention  Weight Management: Develop a combined nutrition and exercise program designed to reach desired caloric intake, while maintaining appropriate intake of nutrient and fiber, sodium and fats, and appropriate energy expenditure required for the weight goal.;Weight Management: Provide education and appropriate resources to help participant work on and attain dietary goals.    Admit Weight  193 lb (87.5 kg)    Goal Weight: Short Term  190 lb (86.2 kg)    Goal Weight: Long Term  188 lb (85.3 kg)    Expected Outcomes  Short Term: Continue to assess and modify interventions until short term weight is achieved;Long Term: Adherence to nutrition and physical activity/exercise program aimed toward attainment of established weight goal;Weight Loss: Understanding of general recommendations for a balanced deficit meal plan, which promotes 1-2 lb weight loss per week and includes a negative energy balance of [phone] kcal/d;Understanding recommendations for meals to include 15-35% energy as protein, 25-35% energy from fat, 35-60% energy from carbohydrates, less than '200mg'$  of dietary cholesterol, 20-35 gm of total fiber daily    Heart Failure  Yes    Intervention  Provide a combined exercise and nutrition program that is supplemented with education, support and counseling about heart failure. Directed toward relieving symptoms such as shortness of breath, decreased exercise tolerance, and extremity edema.    Expected Outcomes  Improve functional capacity of life;Short term: Attendance in program 2-3 days a week with increased exercise capacity. Reported lower sodium intake. Reported increased fruit and  vegetable intake. Reports medication compliance.;Short term: Daily weights obtained and reported for increase. Utilizing diuretic protocols set by physician.;Long term: Adoption of self-care skills and reduction of barriers for early signs and symptoms recognition and intervention leading to self-care maintenance.    Hypertension  Yes    Intervention  Provide education on lifestyle modifcations including regular physical activity/exercise, weight management, moderate sodium restriction and increased consumption of fresh fruit, vegetables, and low fat dairy, alcohol moderation, and smoking cessation.;Monitor prescription use compliance.    Expected Outcomes  Short Term: Continued assessment and intervention until BP is < 140/68m HG in hypertensive participants. < 130/830mHG in hypertensive participants with diabetes, heart failure or chronic kidney disease.;Long Term: Maintenance of blood pressure at goal levels.       Core Components/Risk Factors/Patient Goals Review:    Core Components/Risk Factors/Patient Goals at Discharge (Final Review):    ITP Comments: ITP Comments    Row Name 05/15/19 1141 05/18/19 1034         ITP Comments  elephone orientation completed today. Has apt on Mon 10/19 for EP/RD evals and gym orientation.  [facility]  Completed 6MWT and gym orientation.  Initial ITP created and  sent for review to Dr. Emily Filbert, Medical Director.         Comments: Initial ITP

## 2019-05-18 NOTE — Telephone Encounter (Signed)
Recieved request from :  Forwarded to [employer] for processing

## 2019-05-19 ENCOUNTER — Other Ambulatory Visit: Payer: Self-pay

## 2019-05-19 DIAGNOSIS — I5022 Chronic systolic (congestive) heart failure: Secondary | ICD-10-CM

## 2019-05-19 NOTE — Progress Notes (Signed)
Completed Initial RD eval 

## 2019-05-20 ENCOUNTER — Encounter: Payer: No Typology Code available for payment source | Admitting: *Deleted

## 2019-05-20 ENCOUNTER — Other Ambulatory Visit: Payer: Self-pay

## 2019-05-20 DIAGNOSIS — I5022 Chronic systolic (congestive) heart failure: Secondary | ICD-10-CM | POA: Diagnosis not present

## 2019-05-20 NOTE — Progress Notes (Signed)
Daily Session Note  Patient Details  Name: Edward [name] MRN: [id number] Date of Birth: 09/17/1965 Referring Provider:     Cardiac Rehab from 05/18/2019 in [facility]  Referring Provider  Gollan, Timothy MD      Encounter Date: 05/20/2019  Check In:      Social History   Tobacco Use  Smoking Status Never Smoker  Smokeless Tobacco Never Used    Goals Met:  Exercise tolerated well No report of cardiac concerns or symptoms Strength training completed today  Goals Unmet:  Not Applicable  Comments: First full day of exercise!  Patient was oriented to gym and equipment including functions, settings, policies, and procedures.  Patient's individual exercise prescription and treatment plan were reviewed.  All starting workloads were established based on the results of the 6 minute walk test done at initial orientation visit.  The plan for exercise progression was also introduced and progression will be customized based on patient's performance and goals.    Dr. Mark Miller is Medical Director for [facility] and [facility]. 

## 2019-05-25 ENCOUNTER — Other Ambulatory Visit: Payer: Self-pay

## 2019-05-25 ENCOUNTER — Telehealth: Payer: Self-pay | Admitting: Cardiovascular Disease

## 2019-05-25 ENCOUNTER — Encounter: Payer: No Typology Code available for payment source | Admitting: *Deleted

## 2019-05-25 DIAGNOSIS — I5022 Chronic systolic (congestive) heart failure: Secondary | ICD-10-CM | POA: Diagnosis not present

## 2019-05-25 NOTE — Telephone Encounter (Signed)
Patient came by office  Patient is wanting a medication prescription that will help him sleep States he is on Trazadone to help but does not like it, it does not help and gives nightmares  Please call to discuss

## 2019-05-25 NOTE — Progress Notes (Signed)
Daily Session Note  Patient Details  Name: Edward [name] MRN: [id number] Date of Birth: 08-27-1965 Referring Provider:     Cardiac Rehab from 05/18/2019 in [facility]  Referring Provider  Ida Rogue MD      Encounter Date: 05/25/2019  Check In: Session Check In - 05/25/19 0856      Check-In   Supervising physician immediately available to respond to emergencies  See telemetry face sheet for immediately available ER MD    Location  [facility]    Staff Present  Heath Lark, RN, BSN, Laveda Norman, BS, ACSM CEP, Exercise Physiologist;Joseph Tessie Fass RCP,RRT,BSRT    Virtual Visit  No    Medication changes reported      No    Fall or balance concerns reported     No    Warm-up and Cool-down  Performed on first and last piece of equipment    Resistance Training Performed  Yes    VAD Patient?  No    PAD/SET Patient?  No      Pain Assessment   Currently in Pain?  No/denies          Social History   Tobacco Use  Smoking Status Never Smoker  Smokeless Tobacco Never Used    Goals Met:  Independence with exercise equipment Exercise tolerated well No report of cardiac concerns or symptoms  Goals Unmet:  Not Applicable  Comments: Pt able to follow exercise prescription today without complaint.  Will continue to monitor for progression.    Dr. Emily Filbert is Medical Director for [place] and [facility].

## 2019-05-25 NOTE — Telephone Encounter (Signed)
Call to patient to make him aware that he should contact PCP for sleeping medication recommendations.  Pt verbalized understanding and had no further questions at this time.

## 2019-05-26 NOTE — Telephone Encounter (Signed)
Received form for physician to complete, placed in nurses' box

## 2019-05-27 ENCOUNTER — Other Ambulatory Visit: Payer: Self-pay

## 2019-05-27 ENCOUNTER — Encounter: Payer: No Typology Code available for payment source | Admitting: *Deleted

## 2019-05-27 DIAGNOSIS — I5022 Chronic systolic (congestive) heart failure: Secondary | ICD-10-CM

## 2019-05-27 NOTE — Progress Notes (Signed)
Daily Session Note  Patient Details  Name: Edward [name] MRN: [id number] Date of Birth: 01-30-1966 Referring Provider:     Cardiac Rehab from 05/18/2019 in [facility] Re[facility]  Referring Provider  Ida Rogue MD      Encounter Date: 05/27/2019  Check In: Session Check In - 05/27/19 0928      Check-In   Supervising physician immediately available to respond to emergencies  See telemetry face sheet for immediately available ER MD    Location  [facility]    Staff Present  Heath Lark, RN, BSN, CCRP;Jeanna Durrell BS, Exercise Physiologist;Joseph Hood RCP,RRT,BSRT    Virtual Visit  No    Medication changes reported      No    Fall or balance concerns reported     No    Warm-up and Cool-down  Performed on first and last piece of equipment    Resistance Training Performed  Yes    VAD Patient?  No    PAD/SET Patient?  No      Pain Assessment   Currently in Pain?  No/denies          Social History   Tobacco Use  Smoking Status Never Smoker  Smokeless Tobacco Never Used    Goals Met:  Independence with exercise equipment Exercise tolerated well No report of cardiac concerns or symptoms  Goals Unmet:  Not Applicable  Comments: Pt able to follow exercise prescription today without complaint.  Will continue to monitor for progression. Is working on time with the elliptical  Completed 5 min today    Dr. Emily Filbert is Medical Director for [place] and [facility].

## 2019-05-29 ENCOUNTER — Other Ambulatory Visit: Payer: Self-pay

## 2019-05-29 ENCOUNTER — Encounter: Payer: No Typology Code available for payment source | Admitting: *Deleted

## 2019-05-29 DIAGNOSIS — I5022 Chronic systolic (congestive) heart failure: Secondary | ICD-10-CM | POA: Diagnosis not present

## 2019-05-29 NOTE — Progress Notes (Signed)
Daily Session Note  Patient Details  Name: SHARONE ALMOND MRN: [id number] Date of Birth: 07-16-1966 Referring Provider:     Cardiac Rehab from 05/18/2019 in [facility] Card[facility] and [facility] Rehab  Referring Provider  Ida Rogue MD      Encounter Date: 05/29/2019  Check In: Session Check In - 05/29/19 0855      Check-In   Supervising physician immediately available to respond to emergencies  See telemetry face sheet for immediately available ER MD    Location  [facility]    Staff Present  Heath Lark, RN, BSN, Lance Sell, BA, ACSM CEP, Exercise Physiologist;Joseph Hood RCP,RRT,BSRT    Virtual Visit  No    Medication changes reported      No    Fall or balance concerns reported     No    Warm-up and Cool-down  Performed on first and last piece of equipment    Resistance Training Performed  Yes    VAD Patient?  No    PAD/SET Patient?  No      Pain Assessment   Currently in Pain?  No/denies          Social History   Tobacco Use  Smoking Status Never Smoker  Smokeless Tobacco Never Used    Goals Met:  Independence with exercise equipment Exercise tolerated well No report of cardiac concerns or symptoms  Goals Unmet:  Not Applicable  Comments: Pt able to follow exercise prescription today without complaint.  Will continue to monitor for progression.    Dr. Emily Filbert is Medical Director for [place] and [facility].

## 2019-06-01 ENCOUNTER — Encounter: Payer: No Typology Code available for payment source | Attending: Cardiovascular Disease | Admitting: *Deleted

## 2019-06-01 ENCOUNTER — Other Ambulatory Visit: Payer: Self-pay

## 2019-06-01 DIAGNOSIS — Z79899 Other long term (current) drug therapy: Secondary | ICD-10-CM | POA: Insufficient documentation

## 2019-06-01 DIAGNOSIS — Z7982 Long term (current) use of aspirin: Secondary | ICD-10-CM | POA: Insufficient documentation

## 2019-06-01 DIAGNOSIS — I5022 Chronic systolic (congestive) heart failure: Secondary | ICD-10-CM | POA: Insufficient documentation

## 2019-06-01 DIAGNOSIS — Z7901 Long term (current) use of anticoagulants: Secondary | ICD-10-CM | POA: Insufficient documentation

## 2019-06-01 DIAGNOSIS — I252 Old myocardial infarction: Secondary | ICD-10-CM | POA: Diagnosis not present

## 2019-06-01 NOTE — Progress Notes (Signed)
Daily Session Note  Patient Details  Name: Edward [name] MRN: [id number] Date of Birth: 1966/02/01 Referring Provider:     Cardiac Rehab from 05/18/2019 in [facility] Re[facility]  Referring Provider  Ida Rogue MD      Encounter Date: 06/01/2019  Check In: Session Check In - 06/01/19 0859      Check-In   Supervising physician immediately available to respond to emergencies  See telemetry face sheet for immediately available ER MD    Location  [facility]    Staff Present  Heath Lark, RN, BSN, CCRP;Jessica [place], MA, RCEP, CCRP, [place], BS, ACSM CEP, Exercise Physiologist    Virtual Visit  No    Medication changes reported      No    Fall or balance concerns reported     No    Warm-up and Cool-down  Performed on first and last piece of equipment    Resistance Training Performed  Yes    VAD Patient?  No    PAD/SET Patient?  No      Pain Assessment   Currently in Pain?  No/denies          Social History   Tobacco Use  Smoking Status Never Smoker  Smokeless Tobacco Never Used    Goals Met:  Independence with exercise equipment Exercise tolerated well No report of cardiac concerns or symptoms  Goals Unmet:  Not Applicable  Comments: Pt able to follow exercise prescription today without complaint.  Will continue to monitor for progression.  Reviewed home exercise with pt today.  Pt plans to walk at home for exercise.  Reviewed THR, pulse, RPE, sign and symptoms, NTG use, and when to call 911 or MD.  Also discussed weather considerations and indoor options.  Pt voiced understanding.  Dr. Emily Filbert is Medical Director for [place] and [facility].

## 2019-06-03 ENCOUNTER — Encounter: Payer: No Typology Code available for payment source | Admitting: *Deleted

## 2019-06-03 ENCOUNTER — Encounter: Payer: Self-pay | Admitting: *Deleted

## 2019-06-03 ENCOUNTER — Other Ambulatory Visit: Payer: Self-pay

## 2019-06-03 DIAGNOSIS — I5022 Chronic systolic (congestive) heart failure: Secondary | ICD-10-CM | POA: Diagnosis not present

## 2019-06-03 NOTE — Progress Notes (Signed)
Cardiac Individual Treatment Plan  Patient Details  Name: Edward [name] MRN: [id number] Date of Birth: 01-Aug-1965 Referring Provider:     Cardiac Rehab from 05/18/2019 in [facility] Cardiac and Pulmonary Rehab  Referring Provider  Ida Rogue MD      Initial Encounter Date:    Cardiac Rehab from 05/18/2019 in [facility] Cardiac and Pulmonary Rehab  Date  05/18/19      Visit Diagnosis: Heart failure, chronic systolic ([place])  Patient's Home Medications on Admission:  Current Outpatient Medications:  .  aspirin 81 MG chewable tablet, Chew 1 tablet (81 mg total) by mouth daily., Disp: 30 tablet, Rfl: 0 .  carvedilol (COREG) 12.5 MG tablet, Take 0.5 tablets (6.25 mg total) by mouth 2 (two) times daily., Disp: 180 tablet, Rfl: 2 .  colchicine 0.6 MG tablet, Take 1 tablet (0.6 mg total) by mouth 2 (two) times daily as needed., Disp: 30 tablet, Rfl: 1 .  ENTRESTO 24-26 MG, TAKE 1 TABLET BY MOUTH TWICE A DAY, Disp: 60 tablet, Rfl: 2 .  furosemide (LASIX) 40 MG tablet, Take 1 tablet (40 mg total) by mouth daily., Disp: 90 tablet, Rfl: 3 .  Omega-3 Fatty Acids (FISH OIL) 1000 MG CPDR, Take 1,000 mg by mouth daily. , Disp: , Rfl:  .  oxyCODONE-acetaminophen (PERCOCET) 5-325 MG tablet, Take 1 tablet by mouth every 6 (six) hours as needed for severe pain. (Patient not taking: Reported on 05/15/2019), Disp: 8 tablet, Rfl: 0 .  spironolactone (ALDACTONE) 25 MG tablet, Take 0.5 tablets (12.5 mg total) by mouth daily., Disp: 45 tablet, Rfl: 3 .  vitamin B-12 (CYANOCOBALAMIN) 500 MCG tablet, Take 500 mcg by mouth daily., Disp: , Rfl:  .  Zinc Sulfate (ZINC-220 PO), Take 220 mg by mouth daily. , Disp: , Rfl:   Past Medical History: Past Medical History:  Diagnosis Date  . Alcohol abuse   . Colon polyp 2016  . Herpes   . MI (myocardial infarction) ([place])   . Polysubstance abuse ([place])   . Vitamin B 12 deficiency     Tobacco Use: Social History   Tobacco Use  Smoking Status Never Smoker  Smokeless  Tobacco Never Used    Labs: Recent Review Scientist, physiological    Labs for ITP Cardiac and Pulmonary Rehab Latest Ref Rng & Units 12/17/2018   Cholestrol 0 - 200 mg/dL 174   LDLCALC 0 - 99 mg/dL UNABLE TO CALCULATE IF TRIGLYCERIDE OVER 400 mg/dL   LDLDIRECT 0 - 99 mg/dL 72.0   HDL >40 mg/dL 45   Trlycerides <150 mg/dL 457(H)   Hemoglobin A1c 4.8 - 5.6 % 5.6       Exercise Target Goals: Exercise Program Goal: Individual exercise prescription set using results from initial 6 min walk test and THRR while considering  patient's activity barriers and safety.   Exercise Prescription Goal: Initial exercise prescription builds to 30-45 minutes a day of aerobic activity, 2-3 days per week.  Home exercise guidelines will be given to patient during program as part of exercise prescription that the participant will acknowledge.  Activity Barriers & Risk Stratification: Activity Barriers & Cardiac Risk Stratification - 05/18/19 1036      Activity Barriers & Cardiac Risk Stratification   Activity Barriers  Joint Problems;Decreased Ventricular Function;Deconditioning;History of Falls;Muscular Weakness   L knee pain on occasion   Cardiac Risk Stratification  High       6 Minute Walk: 6 Minute Walk    Row Name 05/18/19 1035  6 Minute Walk   Phase  Initial     Distance  1370 feet     Walk Time  6 minutes     # of Rest Breaks  0     MPH  2.6     METS  4.16     RPE  9     VO2 Peak  14.55     Symptoms  No     Resting HR  67 bpm     Resting BP  142/76     Resting Oxygen Saturation   97 %     Exercise Oxygen Saturation  during 6 min walk  96 %     Max Ex. HR  89 bpm     Max Ex. BP  154/70     2 Minute Post BP  134/70        Oxygen Initial Assessment:   Oxygen Re-Evaluation:   Oxygen Discharge (Final Oxygen Re-Evaluation):   Initial Exercise Prescription: Initial Exercise Prescription - 05/18/19 1000      Date of Initial Exercise RX and Referring Provider   Date   05/18/19    Referring Provider  Ida Rogue MD      Treadmill   MPH  2.5    Grade  2    Minutes  15    METs  3.5      NuStep   Level  3    SPM  80    Minutes  15    METs  3      Recumbant Elliptical   Level  2    RPM  50    Minutes  15    METs  3      Elliptical   Level  1    Speed  3    Minutes  15      REL-XR   Level  3    Speed  50    Minutes  15    METs  3      T5 Nustep   Level  3    SPM  80    Minutes  15    METs  3      Prescription Details   Frequency (times per week)  3    Duration  Progress to 30 minutes of continuous aerobic without signs/symptoms of physical distress      Intensity   THRR 40-80% of Max Heartrate  106-145    Ratings of Perceived Exertion  11-13    Perceived Dyspnea  0-4      Progression   Progression  Continue to progress workloads to maintain intensity without signs/symptoms of physical distress.      Resistance Training   Training Prescription  Yes    Weight  6 lbs    Reps  10-15       Perform Capillary Blood Glucose checks as needed.  Exercise Prescription Changes: Exercise Prescription Changes    Row Name 05/18/19 1000 05/26/19 1300 06/01/19 0900         Response to Exercise   Blood Pressure (Admit)  142/76  110/52  -     Blood Pressure (Exercise)  154/70  142/72  -     Blood Pressure (Exit)  134/72  118/62  -     Heart Rate (Admit)  67 bpm  86 bpm  -     Heart Rate (Exercise)  89 bpm  117 bpm  -     Heart Rate (Exit)  73 bpm  86 bpm  -     Oxygen Saturation (Admit)  97 %  -  -     Oxygen Saturation (Exercise)  96 %  -  -     Rating of Perceived Exertion (Exercise)  9  -  -     Symptoms  none  none  -     Comments  walk test results  -  -     Duration  -  Continue with 30 min of aerobic exercise without signs/symptoms of physical distress.  -     Intensity  -  THRR unchanged  -       Progression   Progression  -  Continue to progress workloads to maintain intensity without signs/symptoms of physical  distress.  -     Average METs  -  3  -       Resistance Training   Training Prescription  -  Yes  -     Weight  -  6 lbs  -     Reps  -  10-15  -       Interval Training   Interval Training  -  No  -       REL-XR   Level  -  3  -     Speed  -  50  -     Minutes  -  15  -     METs  -  3.9  -       T5 Nustep   Level  -  3  -     SPM  -  80  -     Minutes  -  15  -     METs  -  2  -       Home Exercise Plan   Plans to continue exercise at  -  -  Home (comment) walking     Frequency  -  -  Add 2 additional days to program exercise sessions.     Initial Home Exercises Provided  -  -  06/01/19        Exercise Comments: Exercise Comments    Row Name 05/20/19 0901 05/27/19 0930         Exercise Comments  First full day of exercise!  Patient was oriented to gym and equipment including functions, settings, policies, and procedures.  Patient's individual exercise prescription and treatment plan were reviewed.  All starting workloads were established based on the results of the 6 minute walk test done at initial orientation visit.  The plan for exercise progression was also introduced and progression will be customized based on patient's performance and goals.  s working on time with the elliptical  Completed 5 min today  Hip and leg pain with exercise         Exercise Goals and Review: Exercise Goals    Row Name 05/18/19 1038             Exercise Goals   Increase Physical Activity  Yes       Intervention  Provide advice, education, support and counseling about physical activity/exercise needs.;Develop an individualized exercise prescription for aerobic and resistive training based on initial evaluation findings, risk stratification, comorbidities and participant's personal goals.       Expected Outcomes  Short Term: Attend rehab on a regular basis to increase amount of physical activity.;Long Term: Add in home exercise to make exercise part of routine and to  increase amount of  physical activity.;Long Term: Exercising regularly at least 3-5 days a week.       Increase Strength and Stamina  Yes       Intervention  Provide advice, education, support and counseling about physical activity/exercise needs.;Develop an individualized exercise prescription for aerobic and resistive training based on initial evaluation findings, risk stratification, comorbidities and participant's personal goals.       Expected Outcomes  Short Term: Perform resistance training exercises routinely during rehab and add in resistance training at home;Short Term: Increase workloads from initial exercise prescription for resistance, speed, and METs.;Long Term: Improve cardiorespiratory fitness, muscular endurance and strength as measured by increased METs and functional capacity (6MWT)       Able to understand and use rate of perceived exertion (RPE) scale  Yes       Intervention  Provide education and explanation on how to use RPE scale       Expected Outcomes  Short Term: Able to use RPE daily in rehab to express subjective intensity level;Long Term:  Able to use RPE to guide intensity level when exercising independently       Knowledge and understanding of Target Heart Rate Range (THRR)  Yes       Intervention  Provide education and explanation of THRR including how the numbers were predicted and where they are located for reference       Expected Outcomes  Short Term: Able to state/look up THRR;Short Term: Able to use daily as guideline for intensity in rehab;Long Term: Able to use THRR to govern intensity when exercising independently       Able to check pulse independently  Yes       Intervention  Provide education and demonstration on how to check pulse in carotid and radial arteries.;Review the importance of being able to check your own pulse for safety during independent exercise       Expected Outcomes  Short Term: Able to explain why pulse checking is important during independent exercise;Long  Term: Able to check pulse independently and accurately       Understanding of Exercise Prescription  Yes       Intervention  Provide education, explanation, and written materials on patient's individual exercise prescription       Expected Outcomes  Short Term: Able to explain program exercise prescription;Long Term: Able to explain home exercise prescription to exercise independently          Exercise Goals Re-Evaluation : Exercise Goals Re-Evaluation    Lea Name 05/20/19 0901 05/26/19 1327 06/01/19 0932         Exercise Goal Re-Evaluation   Exercise Goals Review  Increase Physical Activity;Able to understand and use rate of perceived exertion (RPE) scale;Knowledge and understanding of Target Heart Rate Range (THRR);Understanding of Exercise Prescription;Increase Strength and Stamina;Able to understand and use Dyspnea scale  Increase Physical Activity;Increase Strength and Stamina;Able to understand and use rate of perceived exertion (RPE) scale;Knowledge and understanding of Target Heart Rate Range (THRR);Able to check pulse independently;Understanding of Exercise Prescription  Increase Physical Activity;Increase Strength and Stamina;Able to understand and use rate of perceived exertion (RPE) scale;Knowledge and understanding of Target Heart Rate Range (THRR);Able to check pulse independently;Understanding of Exercise Prescription     Comments  Reviewed RPE scale, THR and program prescription with pt today.  Pt voiced understanding and was given a copy of goals to take home.  Ronalee Belts has been working at [employer] 11-13 but not quite in THR range.  Staff will monitor progress  Reviewed home exercise with pt today.  Pt plans to walk at home for exercise.  Reviewed THR, pulse, RPE, sign and symptoms, NTG use, and when to call 911 or MD.  Also discussed weather considerations and indoor options.  Pt voiced understanding.     Expected Outcomes  Short: Use RPE daily to regulate intensity. Long: Follow program  prescription in THR.  Short - increase levels to achieve THR (if meds allow) Long - increase overall stamina  Short: Start to walk on off days.  Long: Exercise independently        Discharge Exercise Prescription (Final Exercise Prescription Changes): Exercise Prescription Changes - 06/01/19 0900      Home Exercise Plan   Plans to continue exercise at  Home (comment)   walking   Frequency  Add 2 additional days to program exercise sessions.    Initial Home Exercises Provided  06/01/19       Nutrition:  Target Goals: Understanding of nutrition guidelines, daily intake of sodium <1565m, cholesterol <2053m calories 30% from fat and 7% or less from saturated fats, daily to have 5 or more servings of fruits and vegetables.  Biometrics: Pre Biometrics - 05/18/19 1039      Pre Biometrics   Height  6' 1.6" (1.869 m)    Weight  193 lb (87.5 kg)    BMI (Calculated)  25.06    Single Leg Stand  30 seconds        Nutrition Therapy Plan and Nutrition Goals: Nutrition Therapy & Goals - 05/19/19 1201      Nutrition Therapy   Diet  Low Na, HH diet    Drug/Food Interactions  Purine/Gout    Protein (specify units)  70g    Fiber  25 grams    Whole Grain Foods  3 servings    Saturated Fats  12 max. grams    Fruits and Vegetables  5 servings/day    Sodium  1.5 grams      Personal Nutrition Goals   Nutrition Goal  ST: Cut back red meat to 2-3x per week LT: To go back to work    Comments  B: cereal (frosted flakes)w/ 1-2% milk and bananas and peaches (out of season) or go to cafe and eat sausage and egg D: 2-3 vegetables(starchy and non-starchy) and red meat, pork, chicken, salmon. and pt reports liking sweet-tea, water, and beans. S: pb&J or vanilla wafers. TV dinners and will sometimes have fried chicken. Pt reports having gout flair ups, seeing doctor regarding this again soon. Pt thinks it may be due to red meat. Discussed HH eating and suggested some changes like choosing a higher fiber  cereal and reducing red meat.      Intervention Plan   Intervention  Prescribe, educate and counsel regarding individualized specific dietary modifications aiming towards targeted core components such as weight, hypertension, lipid management, diabetes, heart failure and other comorbidities.;Nutrition handout(s) given to patient.    Expected Outcomes  Short Term Goal: Understand basic principles of dietary content, such as calories, fat, sodium, cholesterol and nutrients.;Short Term Goal: A plan has been developed with personal nutrition goals set during dietitian appointment.;Long Term Goal: Adherence to prescribed nutrition plan.       Nutrition Assessments: Nutrition Assessments - 05/18/19 1042      MEDFICTS Scores   Pre Score  61       Nutrition Goals Re-Evaluation:   Nutrition Goals Discharge (Final Nutrition Goals Re-Evaluation):   Psychosocial:  Target Goals: Acknowledge presence or absence of significant depression and/or stress, maximize coping skills, provide positive support system. Participant is able to verbalize types and ability to use techniques and skills needed for reducing stress and depression.   Initial Review & Psychosocial Screening: Initial Psych Review & Screening - 05/15/19 1124      Initial Review   Current issues with  None Identified      Family Dynamics   Good Support System?  Yes   Friends      does live alone, sister and her husband     Barriers   Psychosocial barriers to participate in program  There are no identifiable barriers or psychosocial needs.      Screening Interventions   Interventions  Encouraged to exercise    Expected Outcomes  Short Term goal: Utilizing psychosocial counselor, staff and physician to assist with identification of specific Stressors or current issues interfering with healing process. Setting desired goal for each stressor or current issue identified.;Long Term Goal: Stressors or current issues are controlled or  eliminated.;Short Term goal: Identification and review with participant of any Quality of Life or Depression concerns found by scoring the questionnaire.;Long Term goal: The participant improves quality of Life and PHQ9 Scores as seen by post scores and/or verbalization of changes       Quality of Life Scores:  Quality of Life - 05/18/19 1041      Quality of Life   Select  Quality of Life      Quality of Life Scores   Health/Function Pre  22.21 %    Socioeconomic Pre  18.69 %    Psych/Spiritual Pre  24.21 %    Family Pre  17.63 %    GLOBAL Pre  21.23 %      Scores of 19 and below usually indicate a poorer quality of life in these areas.  A difference of  2-3 points is a clinically meaningful difference.  A difference of 2-3 points in the total score of the Quality of Life Index has been associated with significant improvement in overall quality of life, self-image, physical symptoms, and general health in studies assessing change in quality of life.  PHQ-9: Recent Review Flowsheet Data    Depression screen [facility] 2/9 05/18/2019   Decreased Interest 0   Down, Depressed, Hopeless 1   PHQ - 2 Score 1   Altered sleeping 2   Tired, decreased energy 0   Change in appetite 0   Feeling bad or failure about yourself  0   Trouble concentrating 0   Moving slowly or fidgety/restless 0   Suicidal thoughts 0   PHQ-9 Score 3     Interpretation of Total Score  Total Score Depression Severity:  1-4 = Minimal depression, 5-9 = Mild depression, 10-14 = Moderate depression, 15-19 = Moderately severe depression, 20-27 = Severe depression   Psychosocial Evaluation and Intervention: Psychosocial Evaluation - 05/15/19 1142      Psychosocial Evaluation & Interventions   Comments  Ronalee Belts has no voiced concerns with depression, sress or anxiety. He is eager to start the program so he can feel better.  He does have good days and bad days.  Ronalee Belts lives alone and has friends and family nearby that are there  to help him if needed. He has a positive attitude about starting the program.    Expected Outcomes  STG: Ronalee Belts will attend all sessions  LTG Ronalee Belts will be able to take the resources and education provided to  continue to live a heart healthy life.    Continue Psychosocial Services   Follow up required by staff       Psychosocial Re-Evaluation:   Psychosocial Discharge (Final Psychosocial Re-Evaluation):   Vocational Rehabilitation: Provide vocational rehab assistance to qualifying candidates.   Vocational Rehab Evaluation & Intervention: Vocational Rehab - 05/15/19 1126      Initial Vocational Rehab Evaluation & Intervention   Assessment shows need for Vocational Rehabilitation  No       Education: Education Goals: Education classes will be provided on a variety of topics geared toward better understanding of heart health and risk factor modification. Participant will state understanding/return demonstration of topics presented as noted by education test scores.  Learning Barriers/Preferences: Learning Barriers/Preferences - 05/15/19 1126      Learning Barriers/Preferences   Learning Barriers  None    Learning Preferences  None       Education Topics:  AED/CPR: - Group verbal and written instruction with the use of models to demonstrate the basic use of the AED with the basic ABC's of resuscitation.   General Nutrition Guidelines/Fats and Fiber: -Group instruction provided by verbal, written material, models and posters to present the general guidelines for heart healthy nutrition. Gives an explanation and review of dietary fats and fiber.   Controlling Sodium/Reading Food Labels: -Group verbal and written material supporting the discussion of sodium use in heart healthy nutrition. Review and explanation with models, verbal and written materials for utilization of the food label.   Exercise Physiology & General Exercise Guidelines: - Group verbal and written instruction  with models to review the exercise physiology of the cardiovascular system and associated critical values. Provides general exercise guidelines with specific guidelines to those with heart or lung disease.    Aerobic Exercise & Resistance Training: - Gives group verbal and written instruction on the various components of exercise. Focuses on aerobic and resistive training programs and the benefits of this training and how to safely progress through these programs..   Cardiac Rehab from 05/20/2019 in [facility] Cardiac and Pulmonary Rehab  Date  05/20/19  Educator  AS  Instruction Review Code  1- Verbalizes Understanding      Flexibility, Balance, Mind/Body Relaxation: Provides group verbal/written instruction on the benefits of flexibility and balance training, including mind/body exercise modes such as yoga, pilates and tai chi.  Demonstration and skill practice provided.   Stress and Anxiety: - Provides group verbal and written instruction about the health risks of elevated stress and causes of high stress.  Discuss the correlation between heart/lung disease and anxiety and treatment options. Review healthy ways to manage with stress and anxiety.   Depression: - Provides group verbal and written instruction on the correlation between heart/lung disease and depressed mood, treatment options, and the stigmas associated with seeking treatment.   Anatomy & Physiology of the Heart: - Group verbal and written instruction and models provide basic cardiac anatomy and physiology, with the coronary electrical and arterial systems. Review of Valvular disease and Heart Failure   Cardiac Procedures: - Group verbal and written instruction to review commonly prescribed medications for heart disease. Reviews the medication, class of the drug, and side effects. Includes the steps to properly store meds and maintain the prescription regimen. (beta blockers and nitrates)   Cardiac Medications I: - Group  verbal and written instruction to review commonly prescribed medications for heart disease. Reviews the medication, class of the drug, and side effects. Includes the steps to properly store meds  and maintain the prescription regimen.   Cardiac Medications II: -Group verbal and written instruction to review commonly prescribed medications for heart disease. Reviews the medication, class of the drug, and side effects. (all other drug classes)   Cardiac Rehab from 05/20/2019 in [facility] Cardiac and Pulmonary Rehab  Date  05/20/19  Educator  SB  Instruction Review Code  1- Verbalizes Understanding       Go Sex-Intimacy & Heart Disease, Get SMART - Goal Setting: - Group verbal and written instruction through game format to discuss heart disease and the return to sexual intimacy. Provides group verbal and written material to discuss and apply goal setting through the application of the S.M.A.R.T. Method.   Other Matters of the Heart: - Provides group verbal, written materials and models to describe Stable Angina and Peripheral Artery. Includes description of the disease process and treatment options available to the cardiac patient.   Exercise & Equipment Safety: - Individual verbal instruction and demonstration of equipment use and safety with use of the equipment.   Cardiac Rehab from 05/20/2019 in [facility] Cardiac and Pulmonary Rehab  Date  05/18/19  Educator  [facility]  Instruction Review Code  1- Verbalizes Understanding      Infection Prevention: - Provides verbal and written material to individual with discussion of infection control including proper hand washing and proper equipment cleaning during exercise session.   Cardiac Rehab from 05/20/2019 in [facility] Cardiac and Pulmonary Rehab  Date  05/18/19  Educator  [facility]  Instruction Review Code  1- Verbalizes Understanding      Falls Prevention: - Provides verbal and written material to individual with discussion of falls prevention and safety.    Cardiac Rehab from 05/20/2019 in [facility] Cardiac and Pulmonary Rehab  Date  05/18/19  Educator  [facility]  Instruction Review Code  1- Verbalizes Understanding      Diabetes: - Individual verbal and written instruction to review signs/symptoms of diabetes, desired ranges of glucose level fasting, after meals and with exercise. Acknowledge that pre and post exercise glucose checks will be done for 3 sessions at entry of program.   Know Your Numbers and Risk Factors: -Group verbal and written instruction about important numbers in your health.  Discussion of what are risk factors and how they play a role in the disease process.  Review of Cholesterol, Blood Pressure, Diabetes, and BMI and the role they play in your overall health.   Cardiac Rehab from 05/20/2019 in [facility] Cardiac and Pulmonary Rehab  Date  05/20/19  Educator  SB  Instruction Review Code  1- Verbalizes Understanding      Sleep Hygiene: -Provides group verbal and written instruction about how sleep can affect your health.  Define sleep hygiene, discuss sleep cycles and impact of sleep habits. Review good sleep hygiene tips.    Other: -Provides group and verbal instruction on various topics (see comments)   Knowledge Questionnaire Score: Knowledge Questionnaire Score - 05/18/19 1043      Knowledge Questionnaire Score   Pre Score  24/26 Education Focus: Nutrition, Heart Failure       Core Components/Risk Factors/Patient Goals at Admission: Personal Goals and Risk Factors at Admission - 05/18/19 1043      Core Components/Risk Factors/Patient Goals on Admission    Weight Management  Yes;Weight Loss    Intervention  Weight Management: Develop a combined nutrition and exercise program designed to reach desired caloric intake, while maintaining appropriate intake of nutrient and fiber, sodium and fats, and appropriate energy expenditure  required for the weight goal.;Weight Management: Provide education and appropriate resources to  help participant work on and attain dietary goals.    Admit Weight  193 lb (87.5 kg)    Goal Weight: Short Term  190 lb (86.2 kg)    Goal Weight: Long Term  188 lb (85.3 kg)    Expected Outcomes  Short Term: Continue to assess and modify interventions until short term weight is achieved;Long Term: Adherence to nutrition and physical activity/exercise program aimed toward attainment of established weight goal;Weight Loss: Understanding of general recommendations for a balanced deficit meal plan, which promotes 1-2 lb weight loss per week and includes a negative energy balance of [phone] kcal/d;Understanding recommendations for meals to include 15-35% energy as protein, 25-35% energy from fat, 35-60% energy from carbohydrates, less than 230m of dietary cholesterol, 20-35 gm of total fiber daily    Heart Failure  Yes    Intervention  Provide a combined exercise and nutrition program that is supplemented with education, support and counseling about heart failure. Directed toward relieving symptoms such as shortness of breath, decreased exercise tolerance, and extremity edema.    Expected Outcomes  Improve functional capacity of life;Short term: Attendance in program 2-3 days a week with increased exercise capacity. Reported lower sodium intake. Reported increased fruit and vegetable intake. Reports medication compliance.;Short term: Daily weights obtained and reported for increase. Utilizing diuretic protocols set by physician.;Long term: Adoption of self-care skills and reduction of barriers for early signs and symptoms recognition and intervention leading to self-care maintenance.    Hypertension  Yes    Intervention  Provide education on lifestyle modifcations including regular physical activity/exercise, weight management, moderate sodium restriction and increased consumption of fresh fruit, vegetables, and low fat dairy, alcohol moderation, and smoking cessation.;Monitor prescription use compliance.     Expected Outcomes  Short Term: Continued assessment and intervention until BP is < 140/943mHG in hypertensive participants. < 130/809mG in hypertensive participants with diabetes, heart failure or chronic kidney disease.;Long Term: Maintenance of blood pressure at goal levels.       Core Components/Risk Factors/Patient Goals Review:    Core Components/Risk Factors/Patient Goals at Discharge (Final Review):    ITP Comments: ITP Comments    Row Name 05/15/19 1141 05/18/19 1034 05/19/19 1220 06/03/19 0627     ITP Comments  elephone orientation completed today. Has apt on Mon 10/19 for EP/RD evals and gym orientation.  [facility]  Completed 6MWT and gym orientation.  Initial ITP created and sent for review to Dr. MarEmily Filbertedical Director.  Completed Initial RD eval.  30 day review completed. Continue with ITP sent to Dr. MarEmily Filbertedical Director of Cardiac and Pulmonary Rehab for review , changes as needed and signature.       Comments:

## 2019-06-03 NOTE — Progress Notes (Signed)
Daily Session Note  Patient Details  Name: Edward [name] MRN: [id number] Date of Birth: 07-Apr-1966 Referring Provider:     Cardiac Rehab from 05/18/2019 in [facility] Cardiac and Pulmonary Rehab  Referring Provider  Ida Rogue MD      Encounter Date: 06/03/2019  Check In:      Social History   Tobacco Use  Smoking Status Never Smoker  Smokeless Tobacco Never Used    Goals Met:  Independence with exercise equipment Exercise tolerated well No report of cardiac concerns or symptoms Strength training completed today  Goals Unmet:  Not Applicable  Comments: Pt able to follow exercise prescription today without complaint.  Will continue to monitor for progression.    Dr. Emily Filbert is Medical Director for [place] and [facility].

## 2019-06-05 ENCOUNTER — Other Ambulatory Visit: Payer: Self-pay

## 2019-06-05 ENCOUNTER — Telehealth: Payer: Self-pay | Admitting: Cardiovascular Disease

## 2019-06-05 ENCOUNTER — Encounter: Payer: No Typology Code available for payment source | Admitting: *Deleted

## 2019-06-05 DIAGNOSIS — I5022 Chronic systolic (congestive) heart failure: Secondary | ICD-10-CM | POA: Diagnosis not present

## 2019-06-05 NOTE — Telephone Encounter (Signed)
Patient calling with a few questions Would like to know if he can take ibuprofen along with other medications Would like to know if he can take amitriptyline, feels it does better than other medications to help him sleep Would also like to know if we can order a test to check on his foot and his wrist, he has been having pains  Please call to discuss

## 2019-06-05 NOTE — Progress Notes (Signed)
Daily Session Note  Patient Details  Name: Edward [name] MRN: [id number] Date of Birth: 10/10/1965 Referring Provider:     Cardiac Rehab from 05/18/2019 in [facility] Cardiac and Pulmonary Rehab  Referring Provider  Ida Rogue MD      Encounter Date: 06/05/2019  Check In: Session Check In - 06/05/19 0924      Check-In   Supervising physician immediately available to respond to emergencies  See telemetry face sheet for immediately available ER MD    Location  [facility]-Cardiac & Pulmonary Rehab    Staff Present  Heath Lark, RN, BSN, CCRP;Jessica Crescent, MA, RCEP, CCRP, [place], [place], ACSM CEP, Exercise Physiologist    Virtual Visit  No    Medication changes reported      No    Fall or balance concerns reported     No    Warm-up and Cool-down  Performed on first and last piece of equipment    Resistance Training Performed  Yes    VAD Patient?  No    PAD/SET Patient?  No      Pain Assessment   Currently in Pain?  No/denies          Social History   Tobacco Use  Smoking Status Never Smoker  Smokeless Tobacco Never Used    Goals Met:  Independence with exercise equipment Exercise tolerated well No report of cardiac concerns or symptoms  Goals Unmet:  Not Applicable  Comments: Pt able to follow exercise prescription today without complaint.  Will continue to monitor for progression.    Dr. Emily Filbert is Medical Director for [place] and [facility].

## 2019-06-05 NOTE — Telephone Encounter (Signed)
To Dr. Rockey Situ to review:  1) patient on ASA, wound not recommend ibuprofen from a nursing perspective 2) unsure about the amitriptyline in regards to his cardiac condition/ other meds- to MD to review 3) would recommend he follow up with his PCP for foot/ wrist imaging from a nursing perspective  Will review with MD about issue # 2 prior to calling the patient.

## 2019-06-06 NOTE — Telephone Encounter (Signed)
Ibuprofen sparingly If he is taking high doses would recommend he talk with primary care for alternate medication  We will talk with primary care about imaging for wrist foot, Also talked to them about amitriptyline as we do not prescribe

## 2019-06-08 ENCOUNTER — Other Ambulatory Visit: Payer: Self-pay

## 2019-06-08 ENCOUNTER — Encounter: Payer: No Typology Code available for payment source | Admitting: *Deleted

## 2019-06-08 ENCOUNTER — Telehealth: Payer: Self-pay | Admitting: Cardiovascular Disease

## 2019-06-08 DIAGNOSIS — I5022 Chronic systolic (congestive) heart failure: Secondary | ICD-10-CM | POA: Diagnosis not present

## 2019-06-08 NOTE — Telephone Encounter (Signed)
Patient concerned about pain L wrist and golf ball swelling on elbow.  Patient in Pain .

## 2019-06-08 NOTE — Progress Notes (Signed)
Daily Session Note  Patient Details  Name: Edward [name] MRN: [id number] Date of Birth: 27-Aug-1965 Referring Provider:     Cardiac Rehab from 05/18/2019 in [facility] Cardiac and Pulmonary Rehab  Referring Provider  Ida Rogue MD      Encounter Date: 06/08/2019  Check In: Session Check In - 06/08/19 0916      Check-In   Supervising physician immediately available to respond to emergencies  See telemetry face sheet for immediately available ER MD    Location  [facility]-Cardiac & Pulmonary Rehab    Staff Present  Heath Lark, RN, BSN, CCRP;Meredith Sherryll Burger, RN Moises Blood, BS, ACSM CEP, Exercise Physiologist    Virtual Visit  No    Medication changes reported      No    Fall or balance concerns reported     No    Warm-up and Cool-down  Performed on first and last piece of equipment    Resistance Training Performed  Yes    VAD Patient?  No    PAD/SET Patient?  No      Pain Assessment   Currently in Pain?  No/denies          Social History   Tobacco Use  Smoking Status Never Smoker  Smokeless Tobacco Never Used    Goals Met:  Independence with exercise equipment Exercise tolerated well No report of cardiac concerns or symptoms  Goals Unmet:  Not Applicable  Comments: Pt able to follow exercise prescription today without complaint.  Will continue to monitor for progression.    Dr. Emily Filbert is Medical Director for [place] and [facility].

## 2019-06-09 NOTE — Telephone Encounter (Signed)
Duplicate, see other phone message

## 2019-06-09 NOTE — Telephone Encounter (Signed)
Call attempted. No answer, no VM.  

## 2019-06-09 NOTE — Telephone Encounter (Signed)
Patient returning call to triage.  °

## 2019-06-09 NOTE — Telephone Encounter (Signed)
Call attempted. No answer. VM full.

## 2019-06-10 ENCOUNTER — Encounter: Payer: No Typology Code available for payment source | Admitting: *Deleted

## 2019-06-10 ENCOUNTER — Other Ambulatory Visit: Payer: Self-pay

## 2019-06-10 DIAGNOSIS — I5022 Chronic systolic (congestive) heart failure: Secondary | ICD-10-CM | POA: Diagnosis not present

## 2019-06-10 NOTE — Progress Notes (Signed)
Daily Session Note  Patient Details  Name: Edward [name] MRN: [id number] Date of Birth: 1965/08/31 Referring Provider:     Cardiac Rehab from 05/18/2019 in [facility] Cardiac and Pulmonary Rehab  Referring Provider  Ida Rogue MD      Encounter Date: 06/10/2019  Check In: Session Check In - 06/10/19 0909      Check-In   Supervising physician immediately available to respond to emergencies  See telemetry face sheet for immediately available ER MD    Location  [facility]-Cardiac & Pulmonary Rehab    Staff Present  Heath Lark, RN, BSN, CCRP;Joseph Hood RCP,RRT,BSRT    Virtual Visit  No    Medication changes reported      No    Fall or balance concerns reported     No    Warm-up and Cool-down  Performed on first and last piece of equipment    Resistance Training Performed  Yes    VAD Patient?  No    PAD/SET Patient?  No      Pain Assessment   Currently in Pain?  No/denies          Social History   Tobacco Use  Smoking Status Never Smoker  Smokeless Tobacco Never Used    Goals Met:  Independence with exercise equipment Exercise tolerated well No report of cardiac concerns or symptoms  Goals Unmet:  Not Applicable  Comments: Pt able to follow exercise prescription today without complaint.  Will continue to monitor for progression.    Dr. Emily Filbert is Medical Director for [place] and [facility].

## 2019-06-10 NOTE — Telephone Encounter (Signed)
No answer/Voicemail box was full. Tried alternative number and no answer with busy signal after a few rings.

## 2019-06-11 NOTE — Telephone Encounter (Signed)
No answer/Voicemail box is full.  

## 2019-06-11 NOTE — Telephone Encounter (Signed)
Forms are pending review and signature by provider

## 2019-06-12 ENCOUNTER — Encounter: Payer: No Typology Code available for payment source | Admitting: *Deleted

## 2019-06-12 ENCOUNTER — Other Ambulatory Visit: Payer: Self-pay

## 2019-06-12 DIAGNOSIS — I5022 Chronic systolic (congestive) heart failure: Secondary | ICD-10-CM | POA: Diagnosis not present

## 2019-06-12 NOTE — Progress Notes (Signed)
Daily Session Note  Patient Details  Name: Edward [name] MRN: [id number] Date of Birth: 1966-05-12 Referring Provider:     Cardiac Rehab from 05/18/2019 in [facility] Cardiac and Pulmonary Rehab  Referring Provider  Ida Rogue MD      Encounter Date: 06/12/2019  Check In: Session Check In - 06/12/19 0846      Check-In   Supervising physician immediately available to respond to emergencies  See telemetry face sheet for immediately available ER MD    Location  [facility]-Cardiac & Pulmonary Rehab    Staff Present  Alberteen Sam, MA, RCEP, CCRP, CCET;Susanne Bice, RN, BSN, CCRP    Virtual Visit  No    Medication changes reported      No    Fall or balance concerns reported     No    Warm-up and Cool-down  Performed on first and last piece of equipment    Resistance Training Performed  Yes    VAD Patient?  No    PAD/SET Patient?  No      Pain Assessment   Currently in Pain?  No/denies          Social History   Tobacco Use  Smoking Status Never Smoker  Smokeless Tobacco Never Used    Goals Met:  Independence with exercise equipment Exercise tolerated well Personal goals reviewed No report of cardiac concerns or symptoms Strength training completed today  Goals Unmet:  Not Applicable  Comments: Pt able to follow exercise prescription today without complaint.  Will continue to monitor for progression.    Dr. Emily Filbert is Medical Director for [place] and [facility].

## 2019-06-15 ENCOUNTER — Encounter: Payer: No Typology Code available for payment source | Admitting: *Deleted

## 2019-06-15 ENCOUNTER — Other Ambulatory Visit: Payer: Self-pay

## 2019-06-15 DIAGNOSIS — I5022 Chronic systolic (congestive) heart failure: Secondary | ICD-10-CM

## 2019-06-15 NOTE — Progress Notes (Signed)
Daily Session Note  Patient Details  Name: Edward [name] MRN: [id number] Date of Birth: 04/26/1966 Referring Provider:     Cardiac Rehab from 05/18/2019 in [facility] Cardiac and Pulmonary Rehab  Referring Provider  Ida Rogue MD      Encounter Date: 06/15/2019  Check In: Session Check In - 06/15/19 0959      Check-In   Supervising physician immediately available to respond to emergencies  See telemetry face sheet for immediately available ER MD    Location  [facility]-Cardiac & Pulmonary Rehab    Staff Present  Heath Lark, RN, BSN, CCRP;Jessica [place], MA, RCEP, CCRP, [place], BS, ACSM CEP, Exercise Physiologist    Virtual Visit  No    Medication changes reported      No    Fall or balance concerns reported     No    Warm-up and Cool-down  Performed on first and last piece of equipment    Resistance Training Performed  Yes    VAD Patient?  No    PAD/SET Patient?  No      Pain Assessment   Currently in Pain?  No/denies          Social History   Tobacco Use  Smoking Status Never Smoker  Smokeless Tobacco Never Used    Goals Met:  Independence with exercise equipment Exercise tolerated well No report of cardiac concerns or symptoms  Goals Unmet:  Not Applicable  Comments: Pt able to follow exercise prescription today without complaint.  Will continue to monitor for progression.    Dr. Emily Filbert is Medical Director for [place] and [facility].

## 2019-06-17 ENCOUNTER — Other Ambulatory Visit: Payer: Self-pay

## 2019-06-17 ENCOUNTER — Encounter: Payer: No Typology Code available for payment source | Admitting: *Deleted

## 2019-06-17 DIAGNOSIS — I5022 Chronic systolic (congestive) heart failure: Secondary | ICD-10-CM | POA: Diagnosis not present

## 2019-06-17 NOTE — Telephone Encounter (Signed)
Forms have been completed and placed up front on Jennifer's desk for processing.  

## 2019-06-17 NOTE — Progress Notes (Signed)
Daily Session Note  Patient Details  Name: Edward [name] MRN: [id number] Date of Birth: July 29, 1966 Referring Provider:     Cardiac Rehab from 05/18/2019 in [facility] Cardiac and Pulmonary Rehab  Referring Provider  Ida Rogue MD      Encounter Date: 06/17/2019  Check In: Session Check In - 06/17/19 0905      Check-In   Supervising physician immediately available to respond to emergencies  See telemetry face sheet for immediately available ER MD    Location  [facility]-Cardiac & Pulmonary Rehab    Staff Present  Darel Hong, RN BSN;Jeanna Durrell BS, Exercise Physiologist;Susanne Bice, RN, BSN, CCRP    Virtual Visit  No    Medication changes reported      No    Fall or balance concerns reported     No    Warm-up and Cool-down  Performed on first and last piece of equipment    Resistance Training Performed  Yes    VAD Patient?  No    PAD/SET Patient?  No      Pain Assessment   Currently in Pain?  No/denies          Social History   Tobacco Use  Smoking Status Never Smoker  Smokeless Tobacco Never Used    Goals Met:  Independence with exercise equipment Exercise tolerated well No report of cardiac concerns or symptoms Strength training completed today  Goals Unmet:  Not Applicable  Comments: Pt able to follow exercise prescription today without complaint.  Will continue to monitor for progression.    Dr. Emily Filbert is Medical Director for [place] and [facility].

## 2019-06-17 NOTE — Telephone Encounter (Signed)
Received forms from [employer] for physician to complete, placed in nurses box 

## 2019-06-19 ENCOUNTER — Other Ambulatory Visit: Payer: Self-pay

## 2019-06-19 ENCOUNTER — Encounter: Payer: No Typology Code available for payment source | Admitting: *Deleted

## 2019-06-19 DIAGNOSIS — I5022 Chronic systolic (congestive) heart failure: Secondary | ICD-10-CM

## 2019-06-19 NOTE — Progress Notes (Signed)
Daily Session Note  Patient Details  Name: Edward [name] MRN: [id number] Date of Birth: [birth year] Referring Provider:     Cardiac Rehab from 05/18/2019 in [facility] Cardiac and Pulmonary Rehab  Referring Provider  Ida Rogue MD      Encounter Date: 06/19/2019  Check In: Session Check In - 06/19/19 0903      Check-In   Supervising physician immediately available to respond to emergencies  See telemetry face sheet for immediately available ER MD    Location  [facility]-Cardiac & Pulmonary Rehab    Staff Present  Heath Lark, RN, BSN, CCRP;Amanda Sommer, BA, ACSM CEP, Exercise Physiologist    Virtual Visit  No    Medication changes reported      No    Fall or balance concerns reported     No    Warm-up and Cool-down  Performed on first and last piece of equipment    Resistance Training Performed  Yes    VAD Patient?  No    PAD/SET Patient?  No      Pain Assessment   Currently in Pain?  No/denies          Social History   Tobacco Use  Smoking Status Never Smoker  Smokeless Tobacco Never Used    Goals Met:  Independence with exercise equipment Exercise tolerated well No report of cardiac concerns or symptoms  Goals Unmet:  Not Applicable  Comments: Pt able to follow exercise prescription today without complaint.  Will continue to monitor for progression.    Dr. Emily Filbert is Medical Director for [place] and [facility].

## 2019-06-19 NOTE — Telephone Encounter (Signed)
Returned call to patient and there was no answer and voicemail box was full.    June 17, 2019 @ 4:38 PM Note   Forms have been completed and placed up front on Jennifer's desk for processing.

## 2019-06-19 NOTE — Telephone Encounter (Signed)
Patient is calling to see the status of his disabilty papers. Please call .

## 2019-06-22 ENCOUNTER — Encounter: Payer: No Typology Code available for payment source | Admitting: *Deleted

## 2019-06-22 ENCOUNTER — Other Ambulatory Visit: Payer: Self-pay

## 2019-06-22 DIAGNOSIS — I5022 Chronic systolic (congestive) heart failure: Secondary | ICD-10-CM

## 2019-06-22 NOTE — Telephone Encounter (Signed)
Spoke with patient and reviewed that paperwork was sent back over to [employer] back on June 17, 2019. He verbalized understanding with no further questions at this time.

## 2019-06-22 NOTE — Telephone Encounter (Signed)
No answer/Voicemail box is full.  

## 2019-06-22 NOTE — Progress Notes (Signed)
Daily Session Note  Patient Details  Name: Edward [name] MRN: [id number] Date of Birth: Feb 21, 1966 Referring Provider:     Cardiac Rehab from 05/18/2019 in [facility] Cardiac and Pulmonary Rehab  Referring Provider  Ida Rogue MD      Encounter Date: 06/22/2019  Check In: Session Check In - 06/22/19 0920      Check-In   Supervising physician immediately available to respond to emergencies  See telemetry face sheet for immediately available ER MD    Location  [facility]-Cardiac & Pulmonary Rehab    Staff Present  Heath Lark, RN, BSN, CCRP;Jessica Lena, MA, RCEP, CCRP, [place], BS, ACSM CEP, Exercise Physiologist    Virtual Visit  No    Medication changes reported      No    Fall or balance concerns reported     No    Warm-up and Cool-down  Performed on first and last piece of equipment    Resistance Training Performed  Yes    VAD Patient?  No    PAD/SET Patient?  No      Pain Assessment   Currently in Pain?  No/denies          Social History   Tobacco Use  Smoking Status Never Smoker  Smokeless Tobacco Never Used    Goals Met:  Independence with exercise equipment Exercise tolerated well No report of cardiac concerns or symptoms  Goals Unmet:  Not Applicable  Comments: Pt able to follow exercise prescription today without complaint.  Will continue to monitor for progression.    Dr. Emily Filbert is Medical Director for [place] and [facility].

## 2019-07-01 ENCOUNTER — Encounter: Payer: Self-pay | Admitting: *Deleted

## 2019-07-01 DIAGNOSIS — I5022 Chronic systolic (congestive) heart failure: Secondary | ICD-10-CM

## 2019-07-01 NOTE — Progress Notes (Signed)
Cardiac Individual Treatment Plan  Patient Details  Name: Edward [name] MRN: [id number] Date of Birth: 07-29-1966 Referring Provider:     Cardiac Rehab from 05/18/2019 in [facility] Cardiac and Pulmonary Rehab  Referring Provider  Ida Rogue MD      Initial Encounter Date:    Cardiac Rehab from 05/18/2019 in [facility] Cardiac and Pulmonary Rehab  Date  05/18/19      Visit Diagnosis: Heart failure, chronic systolic ([place])  Patient's Home Medications on Admission:  Current Outpatient Medications:  .  aspirin 81 MG chewable tablet, Chew 1 tablet (81 mg total) by mouth daily., Disp: 30 tablet, Rfl: 0 .  carvedilol (COREG) 12.5 MG tablet, Take 0.5 tablets (6.25 mg total) by mouth 2 (two) times daily., Disp: 180 tablet, Rfl: 2 .  colchicine 0.6 MG tablet, Take 1 tablet (0.6 mg total) by mouth 2 (two) times daily as needed., Disp: 30 tablet, Rfl: 1 .  ENTRESTO 24-26 MG, TAKE 1 TABLET BY MOUTH TWICE A DAY, Disp: 60 tablet, Rfl: 2 .  furosemide (LASIX) 40 MG tablet, Take 1 tablet (40 mg total) by mouth daily., Disp: 90 tablet, Rfl: 3 .  Omega-3 Fatty Acids (FISH OIL) 1000 MG CPDR, Take 1,000 mg by mouth daily. , Disp: , Rfl:  .  oxyCODONE-acetaminophen (PERCOCET) 5-325 MG tablet, Take 1 tablet by mouth every 6 (six) hours as needed for severe pain. (Patient not taking: Reported on 05/15/2019), Disp: 8 tablet, Rfl: 0 .  spironolactone (ALDACTONE) 25 MG tablet, Take 0.5 tablets (12.5 mg total) by mouth daily., Disp: 45 tablet, Rfl: 3 .  vitamin B-12 (CYANOCOBALAMIN) 500 MCG tablet, Take 500 mcg by mouth daily., Disp: , Rfl:  .  Zinc Sulfate (ZINC-220 PO), Take 220 mg by mouth daily. , Disp: , Rfl:   Past Medical History: Past Medical History:  Diagnosis Date  . Alcohol abuse   . Colon polyp 2016  . Herpes   . MI (myocardial infarction) ([place])   . Polysubstance abuse ([place])   . Vitamin B 12 deficiency     Tobacco Use: Social History   Tobacco Use  Smoking Status Never Smoker  Smokeless  Tobacco Never Used    Labs: Recent Review Scientist, physiological    Labs for ITP Cardiac and Pulmonary Rehab Latest Ref Rng & Units 12/17/2018   Cholestrol 0 - 200 mg/dL 174   LDLCALC 0 - 99 mg/dL UNABLE TO CALCULATE IF TRIGLYCERIDE OVER 400 mg/dL   LDLDIRECT 0 - 99 mg/dL 72.0   HDL >40 mg/dL 45   Trlycerides <150 mg/dL 457(H)   Hemoglobin A1c 4.8 - 5.6 % 5.6       Exercise Target Goals: Exercise Program Goal: Individual exercise prescription set using results from initial 6 min walk test and THRR while considering  patient's activity barriers and safety.   Exercise Prescription Goal: Initial exercise prescription builds to 30-45 minutes a day of aerobic activity, 2-3 days per week.  Home exercise guidelines will be given to patient during program as part of exercise prescription that the participant will acknowledge.  Activity Barriers & Risk Stratification: Activity Barriers & Cardiac Risk Stratification - 05/18/19 1036      Activity Barriers & Cardiac Risk Stratification   Activity Barriers  Joint Problems;Decreased Ventricular Function;Deconditioning;History of Falls;Muscular Weakness   L knee pain on occasion   Cardiac Risk Stratification  High       6 Minute Walk: 6 Minute Walk    Row Name 05/18/19 1035  6 Minute Walk   Phase  Initial     Distance  1370 feet     Walk Time  6 minutes     # of Rest Breaks  0     MPH  2.6     METS  4.16     RPE  9     VO2 Peak  14.55     Symptoms  No     Resting HR  67 bpm     Resting BP  142/76     Resting Oxygen Saturation   97 %     Exercise Oxygen Saturation  during 6 min walk  96 %     Max Ex. HR  89 bpm     Max Ex. BP  154/70     2 Minute Post BP  134/70        Oxygen Initial Assessment:   Oxygen Re-Evaluation:   Oxygen Discharge (Final Oxygen Re-Evaluation):   Initial Exercise Prescription: Initial Exercise Prescription - 05/18/19 1000      Date of Initial Exercise RX and Referring Provider   Date   05/18/19    Referring Provider  Ida Rogue MD      Treadmill   MPH  2.5    Grade  2    Minutes  15    METs  3.5      NuStep   Level  3    SPM  80    Minutes  15    METs  3      Recumbant Elliptical   Level  2    RPM  50    Minutes  15    METs  3      Elliptical   Level  1    Speed  3    Minutes  15      REL-XR   Level  3    Speed  50    Minutes  15    METs  3      T5 Nustep   Level  3    SPM  80    Minutes  15    METs  3      Prescription Details   Frequency (times per week)  3    Duration  Progress to 30 minutes of continuous aerobic without signs/symptoms of physical distress      Intensity   THRR 40-80% of Max Heartrate  106-145    Ratings of Perceived Exertion  11-13    Perceived Dyspnea  0-4      Progression   Progression  Continue to progress workloads to maintain intensity without signs/symptoms of physical distress.      Resistance Training   Training Prescription  Yes    Weight  6 lbs    Reps  10-15       Perform Capillary Blood Glucose checks as needed.  Exercise Prescription Changes: Exercise Prescription Changes    Row Name 05/18/19 1000 05/26/19 1300 06/01/19 0900 06/10/19 0800 06/22/19 1300     Response to Exercise   Blood Pressure (Admit)  142/76  110/52  -  118/70  122/70   Blood Pressure (Exercise)  154/70  142/72  -  150/68  124/70   Blood Pressure (Exit)  134/72  118/62  -  120/62  110/70   Heart Rate (Admit)  67 bpm  86 bpm  -  52 bpm  74 bpm   Heart Rate (Exercise)  89 bpm  117  bpm  -  145 bpm  107 bpm   Heart Rate (Exit)  73 bpm  86 bpm  -  79 bpm  93 bpm   Oxygen Saturation (Admit)  97 %  -  -  -  -   Oxygen Saturation (Exercise)  96 %  -  -  -  -   Rating of Perceived Exertion (Exercise)  9  -  -  16  12   Symptoms  none  none  -  none  none   Comments  walk test results  -  -  -  -   Duration  -  Continue with 30 min of aerobic exercise without signs/symptoms of physical distress.  -  Continue with 30 min of  aerobic exercise without signs/symptoms of physical distress.  Continue with 30 min of aerobic exercise without signs/symptoms of physical distress.   Intensity  -  THRR unchanged  -  THRR unchanged  THRR unchanged     Progression   Progression  -  Continue to progress workloads to maintain intensity without signs/symptoms of physical distress.  -  Continue to progress workloads to maintain intensity without signs/symptoms of physical distress.  Continue to progress workloads to maintain intensity without signs/symptoms of physical distress.   Average METs  -  3  -  3.4  3.5     Resistance Training   Training Prescription  -  Yes  -  Yes  Yes   Weight  -  6 lbs  -  6 lbs  6 lb   Reps  -  10-15  -  10-15  10-15     Interval Training   Interval Training  -  No  -  No  No     Treadmill   MPH  -  -  -  2.5  2.5   Grade  -  -  -  2  2   Minutes  -  -  -  15  15   METs  -  -  -  3.5  3.6     NuStep   Level  -  -  -  4  -   Minutes  -  -  -  15  -   METs  -  -  -  3  -     Recumbant Elliptical   Level  -  -  -  2  -   Minutes  -  -  -  15  -   METs  -  -  -  2.5  -     Elliptical   Level  -  -  -  1  2   Speed  -  -  -  4  -   Minutes  -  -  -  15 with rest breaks  15     REL-XR   Level  -  3  -  3  3   Speed  -  50  -  -  -   Minutes  -  15  -  15  15   METs  -  3.9  -  5.2  4     T5 Nustep   Level  -  3  -  3  3   SPM  -  80  -  -  -   Minutes  -  15  -  15  15   METs  -  2  -  2.8  2.7     Home Exercise Plan   Plans to continue exercise at  -  -  Home (comment) walking  Home (comment) walking  Home (comment) walking   Frequency  -  -  Add 2 additional days to program exercise sessions.  Add 2 additional days to program exercise sessions.  Add 2 additional days to program exercise sessions.   Initial Home Exercises Provided  -  -  06/01/19  06/01/19  06/01/19      Exercise Comments: Exercise Comments    Row Name 05/20/19 0901 05/27/19 0930         Exercise  Comments  First full day of exercise!  Patient was oriented to gym and equipment including functions, settings, policies, and procedures.  Patient's individual exercise prescription and treatment plan were reviewed.  All starting workloads were established based on the results of the 6 minute walk test done at initial orientation visit.  The plan for exercise progression was also introduced and progression will be customized based on patient's performance and goals.  s working on time with the elliptical  Completed 5 min today  Hip and leg pain with exercise         Exercise Goals and Review: Exercise Goals    Row Name 05/18/19 1038             Exercise Goals   Increase Physical Activity  Yes       Intervention  Provide advice, education, support and counseling about physical activity/exercise needs.;Develop an individualized exercise prescription for aerobic and resistive training based on initial evaluation findings, risk stratification, comorbidities and participant's personal goals.       Expected Outcomes  Short Term: Attend rehab on a regular basis to increase amount of physical activity.;Long Term: Add in home exercise to make exercise part of routine and to increase amount of physical activity.;Long Term: Exercising regularly at least 3-5 days a week.       Increase Strength and Stamina  Yes       Intervention  Provide advice, education, support and counseling about physical activity/exercise needs.;Develop an individualized exercise prescription for aerobic and resistive training based on initial evaluation findings, risk stratification, comorbidities and participant's personal goals.       Expected Outcomes  Short Term: Perform resistance training exercises routinely during rehab and add in resistance training at home;Short Term: Increase workloads from initial exercise prescription for resistance, speed, and METs.;Long Term: Improve cardiorespiratory fitness, muscular endurance and  strength as measured by increased METs and functional capacity (6MWT)       Able to understand and use rate of perceived exertion (RPE) scale  Yes       Intervention  Provide education and explanation on how to use RPE scale       Expected Outcomes  Short Term: Able to use RPE daily in rehab to express subjective intensity level;Long Term:  Able to use RPE to guide intensity level when exercising independently       Knowledge and understanding of Target Heart Rate Range (THRR)  Yes       Intervention  Provide education and explanation of THRR including how the numbers were predicted and where they are located for reference       Expected Outcomes  Short Term: Able to state/look up THRR;Short Term: Able to use daily as guideline for intensity in rehab;Long Term: Able to use THRR to govern intensity when exercising independently  Able to check pulse independently  Yes       Intervention  Provide education and demonstration on how to check pulse in carotid and radial arteries.;Review the importance of being able to check your own pulse for safety during independent exercise       Expected Outcomes  Short Term: Able to explain why pulse checking is important during independent exercise;Long Term: Able to check pulse independently and accurately       Understanding of Exercise Prescription  Yes       Intervention  Provide education, explanation, and written materials on patient's individual exercise prescription       Expected Outcomes  Short Term: Able to explain program exercise prescription;Long Term: Able to explain home exercise prescription to exercise independently          Exercise Goals Re-Evaluation : Exercise Goals Re-Evaluation    Row Name 05/20/19 0901 05/26/19 1327 06/01/19 0932 06/10/19 0828 06/12/19 0851     Exercise Goal Re-Evaluation   Exercise Goals Review  Increase Physical Activity;Able to understand and use rate of perceived exertion (RPE) scale;Knowledge and understanding  of Target Heart Rate Range (THRR);Understanding of Exercise Prescription;Increase Strength and Stamina;Able to understand and use Dyspnea scale  Increase Physical Activity;Increase Strength and Stamina;Able to understand and use rate of perceived exertion (RPE) scale;Knowledge and understanding of Target Heart Rate Range (THRR);Able to check pulse independently;Understanding of Exercise Prescription  Increase Physical Activity;Increase Strength and Stamina;Able to understand and use rate of perceived exertion (RPE) scale;Knowledge and understanding of Target Heart Rate Range (THRR);Able to check pulse independently;Understanding of Exercise Prescription  Increase Physical Activity;Increase Strength and Stamina;Understanding of Exercise Prescription  Increase Physical Activity;Increase Strength and Stamina;Understanding of Exercise Prescription   Comments  Reviewed RPE scale, THR and program prescription with pt today.  Pt voiced understanding and was given a copy of goals to take home.  Ronalee Belts has been working at [employer] 11-13 but not quite in THR range.  Staff will monitor progress  Reviewed home exercise with pt today.  Pt plans to walk at home for exercise.  Reviewed THR, pulse, RPE, sign and symptoms, NTG use, and when to call 911 or MD.  Also discussed weather considerations and indoor options.  Pt voiced understanding.  Ronalee Belts is doing well in rehab.  He is now up to 5.3 METs on the XR. He is determined to beat the elliptical, currently he is needing a couple of rest breaks, but they are getting less and less.  We will continue to monitor his progress.  Ronalee Belts is doing his home exercise on the days he is not here.  He is walks at least a mile each day.  He is eager to get back to work.  His strength and stamina are not quite back to he wants them to be for work. We talked about adding in stairs to work on his stamina. He is improving though.   Expected Outcomes  Short: Use RPE daily to regulate intensity. Long: Follow  program prescription in THR.  Short - increase levels to achieve THR (if meds allow) Long - increase overall stamina  Short: Start to walk on off days.  Long: Exercise independently  Short: Talk about intervals on seated equipment.  Long: Continue to exercise at home on off days.  Short: Continue to walk on off days.  Long: Continue to improve stamina.   Lynn Name 06/22/19 1320             Exercise Goal Re-Evaluation  Exercise Goals Review  Increase Physical Activity;Increase Strength and Stamina;Able to understand and use rate of perceived exertion (RPE) scale;Knowledge and understanding of Target Heart Rate Range (THRR);Able to check pulse independently;Understanding of Exercise Prescription       Comments  Ronalee Belts has moved up levels on most machines.  Staff will continue to monitor progress.       Expected Outcomes  Short - continue to improve levels on machines Long - increase MET level          Discharge Exercise Prescription (Final Exercise Prescription Changes): Exercise Prescription Changes - 06/22/19 1300      Response to Exercise   Blood Pressure (Admit)  122/70    Blood Pressure (Exercise)  124/70    Blood Pressure (Exit)  110/70    Heart Rate (Admit)  74 bpm    Heart Rate (Exercise)  107 bpm    Heart Rate (Exit)  93 bpm    Rating of Perceived Exertion (Exercise)  12    Symptoms  none    Duration  Continue with 30 min of aerobic exercise without signs/symptoms of physical distress.    Intensity  THRR unchanged      Progression   Progression  Continue to progress workloads to maintain intensity without signs/symptoms of physical distress.    Average METs  3.5      Resistance Training   Training Prescription  Yes    Weight  6 lb    Reps  10-15      Interval Training   Interval Training  No      Treadmill   MPH  2.5    Grade  2    Minutes  15    METs  3.6      Elliptical   Level  2    Minutes  15      REL-XR   Level  3    Minutes  15    METs  4      T5  Nustep   Level  3    Minutes  15    METs  2.7      Home Exercise Plan   Plans to continue exercise at  Home (comment)   walking   Frequency  Add 2 additional days to program exercise sessions.    Initial Home Exercises Provided  06/01/19       Nutrition:  Target Goals: Understanding of nutrition guidelines, daily intake of sodium '1500mg'$ , cholesterol '200mg'$ , calories 30% from fat and 7% or less from saturated fats, daily to have 5 or more servings of fruits and vegetables.  Biometrics: Pre Biometrics - 05/18/19 1039      Pre Biometrics   Height  6' 1.6" (1.869 m)    Weight  193 lb (87.5 kg)    BMI (Calculated)  25.06    Single Leg Stand  30 seconds        Nutrition Therapy Plan and Nutrition Goals: Nutrition Therapy & Goals - 05/19/19 1201      Nutrition Therapy   Diet  Low Na, HH diet    Drug/Food Interactions  Purine/Gout    Protein (specify units)  70g    Fiber  25 grams    Whole Grain Foods  3 servings    Saturated Fats  12 max. grams    Fruits and Vegetables  5 servings/day    Sodium  1.5 grams      Personal Nutrition Goals   Nutrition Goal  ST: Cut  back red meat to 2-3x per week LT: To go back to work    Comments  B: cereal (frosted flakes)w/ 1-2% milk and bananas and peaches (out of season) or go to cafe and eat sausage and egg D: 2-3 vegetables(starchy and non-starchy) and red meat, pork, chicken, salmon. and pt reports liking sweet-tea, water, and beans. S: pb&J or vanilla wafers. TV dinners and will sometimes have fried chicken. Pt reports having gout flair ups, seeing doctor regarding this again soon. Pt thinks it may be due to red meat. Discussed HH eating and suggested some changes like choosing a higher fiber cereal and reducing red meat.      Intervention Plan   Intervention  Prescribe, educate and counsel regarding individualized specific dietary modifications aiming towards targeted core components such as weight, hypertension, lipid management,  diabetes, heart failure and other comorbidities.;Nutrition handout(s) given to patient.    Expected Outcomes  Short Term Goal: Understand basic principles of dietary content, such as calories, fat, sodium, cholesterol and nutrients.;Short Term Goal: A plan has been developed with personal nutrition goals set during dietitian appointment.;Long Term Goal: Adherence to prescribed nutrition plan.       Nutrition Assessments: Nutrition Assessments - 05/18/19 1042      MEDFICTS Scores   Pre Score  61       Nutrition Goals Re-Evaluation: Nutrition Goals Re-Evaluation    Row Name 06/08/19 0930 06/08/19 0932           Goals   Nutrition Goal  ST: Cut back red meat to 2-3x per week LT: To go back to work  ST: Cut back red meat to 2-3x per week and continue with whole wheat cereal LT: To go back to work      Comment  -  Pt has switched to having red meat twice a week. Pt reports his R foot has been hurting him - could be gout flare up. Pt reports his wirst has also been hurting him, but he has had previous injuries with it. Pt reports changed his cereal from frosted flakes to cheerios. Pt reports he is doing well and would like to continue with current changes for the next 30 days.      Expected Outcome  -  ST: Cut back red meat to 2-3x per week and continue with whole wheat cereal LT: To go back to work         Nutrition Goals Discharge (Final Nutrition Goals Re-Evaluation): Nutrition Goals Re-Evaluation - 06/08/19 0932      Goals   Nutrition Goal  ST: Cut back red meat to 2-3x per week and continue with whole wheat cereal LT: To go back to work    Comment  Pt has switched to having red meat twice a week. Pt reports his R foot has been hurting him - could be gout flare up. Pt reports his wirst has also been hurting him, but he has had previous injuries with it. Pt reports changed his cereal from frosted flakes to cheerios. Pt reports he is doing well and would like to continue with current  changes for the next 30 days.    Expected Outcome  ST: Cut back red meat to 2-3x per week and continue with whole wheat cereal LT: To go back to work       Psychosocial: Target Goals: Acknowledge presence or absence of significant depression and/or stress, maximize coping skills, provide positive support system. Participant is able to verbalize types and ability to use  techniques and skills needed for reducing stress and depression.   Initial Review & Psychosocial Screening: Initial Psych Review & Screening - 05/15/19 1124      Initial Review   Current issues with  None Identified      Family Dynamics   Good Support System?  Yes   Friends      does live alone, sister and her husband     Barriers   Psychosocial barriers to participate in program  There are no identifiable barriers or psychosocial needs.      Screening Interventions   Interventions  Encouraged to exercise    Expected Outcomes  Short Term goal: Utilizing psychosocial counselor, staff and physician to assist with identification of specific Stressors or current issues interfering with healing process. Setting desired goal for each stressor or current issue identified.;Long Term Goal: Stressors or current issues are controlled or eliminated.;Short Term goal: Identification and review with participant of any Quality of Life or Depression concerns found by scoring the questionnaire.;Long Term goal: The participant improves quality of Life and PHQ9 Scores as seen by post scores and/or verbalization of changes       Quality of Life Scores:  Quality of Life - 05/18/19 1041      Quality of Life   Select  Quality of Life      Quality of Life Scores   Health/Function Pre  22.21 %    Socioeconomic Pre  18.69 %    Psych/Spiritual Pre  24.21 %    Family Pre  17.63 %    GLOBAL Pre  21.23 %      Scores of 19 and below usually indicate a poorer quality of life in these areas.  A difference of  2-3 points is a clinically  meaningful difference.  A difference of 2-3 points in the total score of the Quality of Life Index has been associated with significant improvement in overall quality of life, self-image, physical symptoms, and general health in studies assessing change in quality of life.  PHQ-9: Recent Review Flowsheet Data    Depression screen [facility] 2/9 05/18/2019   Decreased Interest 0   Down, Depressed, Hopeless 1   PHQ - 2 Score 1   Altered sleeping 2   Tired, decreased energy 0   Change in appetite 0   Feeling bad or failure about yourself  0   Trouble concentrating 0   Moving slowly or fidgety/restless 0   Suicidal thoughts 0   PHQ-9 Score 3     Interpretation of Total Score  Total Score Depression Severity:  1-4 = Minimal depression, 5-9 = Mild depression, 10-14 = Moderate depression, 15-19 = Moderately severe depression, 20-27 = Severe depression   Psychosocial Evaluation and Intervention: Psychosocial Evaluation - 05/15/19 1142      Psychosocial Evaluation & Interventions   Comments  Ronalee Belts has no voiced concerns with depression, sress or anxiety. He is eager to start the program so he can feel better.  He does have good days and bad days.  Ronalee Belts lives alone and has friends and family nearby that are there to help him if needed. He has a positive attitude about starting the program.    Expected Outcomes  STG: Ronalee Belts will attend all sessions  LTG Ronalee Belts will be able to take the resources and education provided to continue to live a heart healthy life.    Continue Psychosocial Services   Follow up required by staff       Psychosocial Re-Evaluation: Psychosocial  Re-Evaluation    Row Name 06/12/19 0848             Psychosocial Re-Evaluation   Current issues with  Current Sleep Concerns;Current Stress Concerns       Comments  Ronalee Belts is going doctor on Tuesday to talk about chaning his sleep medication.  He used to use Xanax which worked.  Currently on trazadone, but it is not working.  He really  hopes that it will get better.  His foot is feeling better, but now his elbow is swollen so he will show them that too. He is doing well otherwise.       Expected Outcomes  Short: Talk to doctor about sleep.  Long: Continue to work on improved sleep.       Interventions  Encouraged to attend Cardiac Rehabilitation for the exercise       Continue Psychosocial Services   Follow up required by staff         Initial Review   Source of Stress Concerns  Chronic Illness          Psychosocial Discharge (Final Psychosocial Re-Evaluation): Psychosocial Re-Evaluation - 06/12/19 0848      Psychosocial Re-Evaluation   Current issues with  Current Sleep Concerns;Current Stress Concerns    Comments  Ronalee Belts is going doctor on Tuesday to talk about chaning his sleep medication.  He used to use Xanax which worked.  Currently on trazadone, but it is not working.  He really hopes that it will get better.  His foot is feeling better, but now his elbow is swollen so he will show them that too. He is doing well otherwise.    Expected Outcomes  Short: Talk to doctor about sleep.  Long: Continue to work on improved sleep.    Interventions  Encouraged to attend Cardiac Rehabilitation for the exercise    Continue Psychosocial Services   Follow up required by staff      Initial Review   Source of Stress Concerns  Chronic Illness       Vocational Rehabilitation: Provide vocational rehab assistance to qualifying candidates.   Vocational Rehab Evaluation & Intervention: Vocational Rehab - 05/15/19 1126      Initial Vocational Rehab Evaluation & Intervention   Assessment shows need for Vocational Rehabilitation  No       Education: Education Goals: Education classes will be provided on a variety of topics geared toward better understanding of heart health and risk factor modification. Participant will state understanding/return demonstration of topics presented as noted by education test scores.  Learning  Barriers/Preferences: Learning Barriers/Preferences - 05/15/19 1126      Learning Barriers/Preferences   Learning Barriers  None    Learning Preferences  None       Education Topics:  AED/CPR: - Group verbal and written instruction with the use of models to demonstrate the basic use of the AED with the basic ABC's of resuscitation.   General Nutrition Guidelines/Fats and Fiber: -Group instruction provided by verbal, written material, models and posters to present the general guidelines for heart healthy nutrition. Gives an explanation and review of dietary fats and fiber.   Cardiac Rehab from 06/17/2019 in [facility] Cardiac and Pulmonary Rehab  Date  06/17/19  Educator  MC,RD  Instruction Review Code  1- Verbalizes Understanding      Controlling Sodium/Reading Food Labels: -Group verbal and written material supporting the discussion of sodium use in heart healthy nutrition. Review and explanation with models, verbal and written materials for  utilization of the food label.   Exercise Physiology & General Exercise Guidelines: - Group verbal and written instruction with models to review the exercise physiology of the cardiovascular system and associated critical values. Provides general exercise guidelines with specific guidelines to those with heart or lung disease.    Aerobic Exercise & Resistance Training: - Gives group verbal and written instruction on the various components of exercise. Focuses on aerobic and resistive training programs and the benefits of this training and how to safely progress through these programs..   Cardiac Rehab from 06/17/2019 in [facility] Cardiac and Pulmonary Rehab  Date  05/20/19  Educator  AS  Instruction Review Code  1- Verbalizes Understanding      Flexibility, Balance, Mind/Body Relaxation: Provides group verbal/written instruction on the benefits of flexibility and balance training, including mind/body exercise modes such as yoga, pilates and tai  chi.  Demonstration and skill practice provided.   Cardiac Rehab from 06/17/2019 in [facility] Cardiac and Pulmonary Rehab  Date  06/17/19  Educator  AS  Instruction Review Code  1- Verbalizes Understanding      Stress and Anxiety: - Provides group verbal and written instruction about the health risks of elevated stress and causes of high stress.  Discuss the correlation between heart/lung disease and anxiety and treatment options. Review healthy ways to manage with stress and anxiety.   Depression: - Provides group verbal and written instruction on the correlation between heart/lung disease and depressed mood, treatment options, and the stigmas associated with seeking treatment.   Anatomy & Physiology of the Heart: - Group verbal and written instruction and models provide basic cardiac anatomy and physiology, with the coronary electrical and arterial systems. Review of Valvular disease and Heart Failure   Cardiac Procedures: - Group verbal and written instruction to review commonly prescribed medications for heart disease. Reviews the medication, class of the drug, and side effects. Includes the steps to properly store meds and maintain the prescription regimen. (beta blockers and nitrates)   Cardiac Medications I: - Group verbal and written instruction to review commonly prescribed medications for heart disease. Reviews the medication, class of the drug, and side effects. Includes the steps to properly store meds and maintain the prescription regimen.   Cardiac Medications II: -Group verbal and written instruction to review commonly prescribed medications for heart disease. Reviews the medication, class of the drug, and side effects. (all other drug classes)   Cardiac Rehab from 06/17/2019 in [facility] Cardiac and Pulmonary Rehab  Date  05/20/19  Educator  SB  Instruction Review Code  1- Verbalizes Understanding       Go Sex-Intimacy & Heart Disease, Get SMART - Goal Setting: - Group  verbal and written instruction through game format to discuss heart disease and the return to sexual intimacy. Provides group verbal and written material to discuss and apply goal setting through the application of the S.M.A.R.T. Method.   Other Matters of the Heart: - Provides group verbal, written materials and models to describe Stable Angina and Peripheral Artery. Includes description of the disease process and treatment options available to the cardiac patient.   Exercise & Equipment Safety: - Individual verbal instruction and demonstration of equipment use and safety with use of the equipment.   Cardiac Rehab from 06/17/2019 in [facility] Cardiac and Pulmonary Rehab  Date  05/18/19  Educator  [facility]  Instruction Review Code  1- Verbalizes Understanding      Infection Prevention: - Provides verbal and written material to individual with discussion  of infection control including proper hand washing and proper equipment cleaning during exercise session.   Cardiac Rehab from 06/17/2019 in [facility] Cardiac and Pulmonary Rehab  Date  05/18/19  Educator  [facility]  Instruction Review Code  1- Verbalizes Understanding      Falls Prevention: - Provides verbal and written material to individual with discussion of falls prevention and safety.   Cardiac Rehab from 06/17/2019 in [facility] Cardiac and Pulmonary Rehab  Date  05/18/19  Educator  [facility]  Instruction Review Code  1- Verbalizes Understanding      Diabetes: - Individual verbal and written instruction to review signs/symptoms of diabetes, desired ranges of glucose level fasting, after meals and with exercise. Acknowledge that pre and post exercise glucose checks will be done for 3 sessions at entry of program.   Know Your Numbers and Risk Factors: -Group verbal and written instruction about important numbers in your health.  Discussion of what are risk factors and how they play a role in the disease process.  Review of Cholesterol, Blood Pressure,  Diabetes, and BMI and the role they play in your overall health.   Cardiac Rehab from 06/17/2019 in [facility] Cardiac and Pulmonary Rehab  Date  05/20/19  Educator  SB  Instruction Review Code  1- Verbalizes Understanding      Sleep Hygiene: -Provides group verbal and written instruction about how sleep can affect your health.  Define sleep hygiene, discuss sleep cycles and impact of sleep habits. Review good sleep hygiene tips.    Other: -Provides group and verbal instruction on various topics (see comments)   Knowledge Questionnaire Score: Knowledge Questionnaire Score - 05/18/19 1043      Knowledge Questionnaire Score   Pre Score  24/26 Education Focus: Nutrition, Heart Failure       Core Components/Risk Factors/Patient Goals at Admission: Personal Goals and Risk Factors at Admission - 05/18/19 1043      Core Components/Risk Factors/Patient Goals on Admission    Weight Management  Yes;Weight Loss    Intervention  Weight Management: Develop a combined nutrition and exercise program designed to reach desired caloric intake, while maintaining appropriate intake of nutrient and fiber, sodium and fats, and appropriate energy expenditure required for the weight goal.;Weight Management: Provide education and appropriate resources to help participant work on and attain dietary goals.    Admit Weight  193 lb (87.5 kg)    Goal Weight: Short Term  190 lb (86.2 kg)    Goal Weight: Long Term  188 lb (85.3 kg)    Expected Outcomes  Short Term: Continue to assess and modify interventions until short term weight is achieved;Long Term: Adherence to nutrition and physical activity/exercise program aimed toward attainment of established weight goal;Weight Loss: Understanding of general recommendations for a balanced deficit meal plan, which promotes 1-2 lb weight loss per week and includes a negative energy balance of [phone] kcal/d;Understanding recommendations for meals to include 15-35% energy as  protein, 25-35% energy from fat, 35-60% energy from carbohydrates, less than '200mg'$  of dietary cholesterol, 20-35 gm of total fiber daily    Heart Failure  Yes    Intervention  Provide a combined exercise and nutrition program that is supplemented with education, support and counseling about heart failure. Directed toward relieving symptoms such as shortness of breath, decreased exercise tolerance, and extremity edema.    Expected Outcomes  Improve functional capacity of life;Short term: Attendance in program 2-3 days a week with increased exercise capacity. Reported lower sodium intake. Reported increased fruit  and vegetable intake. Reports medication compliance.;Short term: Daily weights obtained and reported for increase. Utilizing diuretic protocols set by physician.;Long term: Adoption of self-care skills and reduction of barriers for early signs and symptoms recognition and intervention leading to self-care maintenance.    Hypertension  Yes    Intervention  Provide education on lifestyle modifcations including regular physical activity/exercise, weight management, moderate sodium restriction and increased consumption of fresh fruit, vegetables, and low fat dairy, alcohol moderation, and smoking cessation.;Monitor prescription use compliance.    Expected Outcomes  Short Term: Continued assessment and intervention until BP is < 140/72m HG in hypertensive participants. < 130/868mHG in hypertensive participants with diabetes, heart failure or chronic kidney disease.;Long Term: Maintenance of blood pressure at goal levels.       Core Components/Risk Factors/Patient Goals Review:  Goals and Risk Factor Review    Row Name 06/12/19 0850             Core Components/Risk Factors/Patient Goals Review   Personal Goals Review  Weight Management/Obesity;Hypertension;Heart Failure       Review  MiRonalee Beltss doing well in rehab.  His weight is up a little and he checks it daily.  He has a little soreness on  the side of his chest from pulling the other day.  Overall, no symptoms.  Blood pressures have been good and checks them at home regularly.  His elbow is swollen and he will talk to doctor about that on Tuesday when he goes.       Expected Outcomes  Short: Talk about elbow.  Long: Continue to work on weight loss.          Core Components/Risk Factors/Patient Goals at Discharge (Final Review):  Goals and Risk Factor Review - 06/12/19 0850      Core Components/Risk Factors/Patient Goals Review   Personal Goals Review  Weight Management/Obesity;Hypertension;Heart Failure    Review  MiRonalee Beltss doing well in rehab.  His weight is up a little and he checks it daily.  He has a little soreness on the side of his chest from pulling the other day.  Overall, no symptoms.  Blood pressures have been good and checks them at home regularly.  His elbow is swollen and he will talk to doctor about that on Tuesday when he goes.    Expected Outcomes  Short: Talk about elbow.  Long: Continue to work on weight loss.       ITP Comments: ITP Comments    Row Name 05/15/19 1141 05/18/19 1034 05/19/19 1220 06/03/19 0627 07/01/19 0943   ITP Comments  elephone orientation completed today. Has apt on Mon 10/19 for EP/RD evals and gym orientation.  [facility]  Completed 6MWT and gym orientation.  Initial ITP created and sent for review to Dr. MaEmily FilbertMedical Director.  Completed Initial RD eval.  30 day review completed. Continue with ITP sent to Dr. MaEmily FilbertMedical Director of Cardiac and Pulmonary Rehab for review , changes as needed and signature.  30 day review competed . ITP sent to Dr MaEmily Filbertor review, changes as needed and ITP approval signature.      Comments:

## 2019-07-03 ENCOUNTER — Telehealth: Payer: Self-pay | Admitting: Cardiovascular Disease

## 2019-07-03 NOTE — Telephone Encounter (Signed)
Please call to  discuss when patient can return to work. Patient is under the understanding Dr. Rockey Situ released him to return on 11/30, and he still has rehab appointments to attend .

## 2019-07-03 NOTE — Telephone Encounter (Signed)
Returned call to patient. He reports that he received call yesterday from insurance "investigator" that was questioning him about paperwork.  Pt reports that 2 disability forms had been filled out although I can only see short term disability in media forms in chart which clear him to return to work 11/30. Pt reports that he is unable to return to work at this time d/t cardiac rehab still in process.   I called Meriam Sprague to help assist and call [employer] to follow up.

## 2019-07-03 NOTE — Telephone Encounter (Signed)
Patient calling to check on status  Attempted to explain what was stated in previous note but patient unsure of what to do next Please advise

## 2019-07-03 NOTE — Telephone Encounter (Signed)
Spoke with Verne at [employer] and she states both forms have been completed   One claim in June and one claim in November 2020.    [employer] does not have any pending forms and these are the only forms sent from [facility] [place] office.  Not other notes entered .  No other forms scanned.    Called patient to discuss.  No ans .  Full VM.

## 2019-07-06 ENCOUNTER — Encounter: Payer: Self-pay | Admitting: *Deleted

## 2019-07-06 DIAGNOSIS — I5022 Chronic systolic (congestive) heart failure: Secondary | ICD-10-CM

## 2019-07-06 NOTE — Telephone Encounter (Signed)
Patient came by office with copy of letter from his insurance and was very upset. Spoke with patient and he wanted to know why he was released with no restrictions given he has 31 sessions with Cardiac rehab left to complete. Advised that Dr. Rockey Situ had completed those forms and we did send back to processing. He states that if this is the case he is going to cancel all of his rehab appointments and go back to work. Advised that I would make provider aware and to let us know if any further questions.

## 2019-07-06 NOTE — Progress Notes (Signed)
Discharge Progress Report  Patient Details  Name: Edward [name] MRN: [id number] Date of Birth: 1966-03-09 Referring Provider:     Cardiac Rehab from 05/18/2019 in [facility]  Referring Provider  Ida Rogue MD       Number of Visits: 15/36  Reason for Discharge:  Early Exit:  Back to work  Smoking History:  Social History   Tobacco Use  Smoking Status Never Smoker  Smokeless Tobacco Never Used    Diagnosis:  Heart failure, chronic systolic ([place])  ADL [facility]:   Initial Exercise Prescription: Initial Exercise Prescription - 05/18/19 1000      Date of Initial Exercise RX and Referring Provider   Date  05/18/19    Referring Provider  Ida Rogue MD      Treadmill   MPH  2.5    Grade  2    Minutes  15    METs  3.5      NuStep   Level  3    SPM  80    Minutes  15    METs  3      Recumbant Elliptical   Level  2    RPM  50    Minutes  15    METs  3      Elliptical   Level  1    Speed  3    Minutes  15      REL-XR   Level  3    Speed  50    Minutes  15    METs  3      T5 Nustep   Level  3    SPM  80    Minutes  15    METs  3      Prescription Details   Frequency (times per week)  3    Duration  Progress to 30 minutes of continuous aerobic without signs/symptoms of physical distress      Intensity   THRR 40-80% of Max Heartrate  106-145    Ratings of Perceived Exertion  11-13    Perceived Dyspnea  0-4      Progression   Progression  Continue to progress workloads to maintain intensity without signs/symptoms of physical distress.      Resistance Training   Training Prescription  Yes    Weight  6 lbs    Reps  10-15       Discharge Exercise Prescription (Final Exercise Prescription Changes): Exercise Prescription Changes - 06/22/19 1300      Response to Exercise   Blood Pressure (Admit)  122/70    Blood Pressure (Exercise)  124/70    Blood Pressure (Exit)  110/70    Heart Rate (Admit)  74 bpm    Heart  Rate (Exercise)  107 bpm    Heart Rate (Exit)  93 bpm    Rating of Perceived Exertion (Exercise)  12    Symptoms  none    Duration  Continue with 30 min of aerobic exercise without signs/symptoms of physical distress.    Intensity  THRR unchanged      Progression   Progression  Continue to progress workloads to maintain intensity without signs/symptoms of physical distress.    Average METs  3.5      Resistance Training   Training Prescription  Yes    Weight  6 lb    Reps  10-15      Interval Training   Interval Training  No  Treadmill   MPH  2.5    Grade  2    Minutes  15    METs  3.6      Elliptical   Level  2    Minutes  15      REL-XR   Level  3    Minutes  15    METs  4      T5 Nustep   Level  3    Minutes  15    METs  2.7      Home Exercise Plan   Plans to continue exercise at  Home (comment)   walking   Frequency  Add 2 additional days to program exercise sessions.    Initial Home Exercises Provided  06/01/19       Functional Capacity: 6 Minute Walk    Row Name 05/18/19 1035         6 Minute Walk   Phase  Initial     Distance  1370 feet     Walk Time  6 minutes     # of Rest Breaks  0     MPH  2.6     METS  4.16     RPE  9     VO2 Peak  14.55     Symptoms  No     Resting HR  67 bpm     Resting BP  142/76     Resting Oxygen Saturation   97 %     Exercise Oxygen Saturation  during 6 min walk  96 %     Max Ex. HR  89 bpm     Max Ex. BP  154/70     2 Minute Post BP  134/70        Psychological, QOL, Others - Outcomes: PHQ 2/9: Depression screen PHQ 2/9 05/18/2019  Decreased Interest 0  Down, Depressed, Hopeless 1  PHQ - 2 Score 1  Altered sleeping 2  Tired, decreased energy 0  Change in appetite 0  Feeling bad or failure about yourself  0  Trouble concentrating 0  Moving slowly or fidgety/restless 0  Suicidal thoughts 0  PHQ-9 Score 3    Quality of Life: Quality of Life - 05/18/19 1041      Quality of Life   Select   Quality of Life      Quality of Life Scores   Health/Function Pre  22.21 %    Socioeconomic Pre  18.69 %    Psych/Spiritual Pre  24.21 %    Family Pre  17.63 %    GLOBAL Pre  21.23 %       Personal Goals: Goals established at orientation with interventions provided to work toward goal. Personal Goals and Risk Factors at Admission - 05/18/19 1043      Core Components/Risk Factors/Patient Goals on Admission    Weight Management  Yes;Weight Loss    Intervention  Weight Management: Develop a combined nutrition and exercise program designed to reach desired caloric intake, while maintaining appropriate intake of nutrient and fiber, sodium and fats, and appropriate energy expenditure required for the weight goal.;Weight Management: Provide education and appropriate resources to help participant work on and attain dietary goals.    Admit Weight  193 lb (87.5 kg)    Goal Weight: Short Term  190 lb (86.2 kg)    Goal Weight: Long Term  188 lb (85.3 kg)    Expected Outcomes  Short Term: Continue to assess and modify  interventions until short term weight is achieved;Long Term: Adherence to nutrition and physical activity/exercise program aimed toward attainment of established weight goal;Weight Loss: Understanding of general recommendations for a balanced deficit meal plan, which promotes 1-2 lb weight loss per week and includes a negative energy balance of [phone] kcal/d;Understanding recommendations for meals to include 15-35% energy as protein, 25-35% energy from fat, 35-60% energy from carbohydrates, less than 212m of dietary cholesterol, 20-35 gm of total fiber daily    Heart Failure  Yes    Intervention  Provide a combined exercise and nutrition program that is supplemented with education, support and counseling about heart failure. Directed toward relieving symptoms such as shortness of breath, decreased exercise tolerance, and extremity edema.    Expected Outcomes  Improve functional capacity  of life;Short term: Attendance in program 2-3 days a week with increased exercise capacity. Reported lower sodium intake. Reported increased fruit and vegetable intake. Reports medication compliance.;Short term: Daily weights obtained and reported for increase. Utilizing diuretic protocols set by physician.;Long term: Adoption of self-care skills and reduction of barriers for early signs and symptoms recognition and intervention leading to self-care maintenance.    Hypertension  Yes    Intervention  Provide education on lifestyle modifcations including regular physical activity/exercise, weight management, moderate sodium restriction and increased consumption of fresh fruit, vegetables, and low fat dairy, alcohol moderation, and smoking cessation.;Monitor prescription use compliance.    Expected Outcomes  Short Term: Continued assessment and intervention until BP is < 140/919mHG in hypertensive participants. < 130/8064mG in hypertensive participants with diabetes, heart failure or chronic kidney disease.;Long Term: Maintenance of blood pressure at goal levels.        Personal Goals Discharge: Goals and Risk Factor Review    Row Name 06/12/19 0850             Core Components/Risk Factors/Patient Goals Review   Personal Goals Review  Weight Management/Obesity;Hypertension;Heart Failure       Review  MikRonalee Belts doing well in rehab.  His weight is up a little and he checks it daily.  He has a little soreness on the side of his chest from pulling the other day.  Overall, no symptoms.  Blood pressures have been good and checks them at home regularly.  His elbow is swollen and he will talk to doctor about that on Tuesday when he goes.       Expected Outcomes  Short: Talk about elbow.  Long: Continue to work on weight loss.          Exercise Goals and Review: Exercise Goals    Row Name 05/18/19 1038             Exercise Goals   Increase Physical Activity  Yes       Intervention  Provide  advice, education, support and counseling about physical activity/exercise needs.;Develop an individualized exercise prescription for aerobic and resistive training based on initial evaluation findings, risk stratification, comorbidities and participant's personal goals.       Expected Outcomes  Short Term: Attend rehab on a regular basis to increase amount of physical activity.;Long Term: Add in home exercise to make exercise part of routine and to increase amount of physical activity.;Long Term: Exercising regularly at least 3-5 days a week.       Increase Strength and Stamina  Yes       Intervention  Provide advice, education, support and counseling about physical activity/exercise needs.;Develop an individualized exercise prescription for aerobic and resistive  training based on initial evaluation findings, risk stratification, comorbidities and participant's personal goals.       Expected Outcomes  Short Term: Perform resistance training exercises routinely during rehab and add in resistance training at home;Short Term: Increase workloads from initial exercise prescription for resistance, speed, and METs.;Long Term: Improve cardiorespiratory fitness, muscular endurance and strength as measured by increased METs and functional capacity (6MWT)       Able to understand and use rate of perceived exertion (RPE) scale  Yes       Intervention  Provide education and explanation on how to use RPE scale       Expected Outcomes  Short Term: Able to use RPE daily in rehab to express subjective intensity level;Long Term:  Able to use RPE to guide intensity level when exercising independently       Knowledge and understanding of Target Heart Rate Range (THRR)  Yes       Intervention  Provide education and explanation of THRR including how the numbers were predicted and where they are located for reference       Expected Outcomes  Short Term: Able to state/look up THRR;Short Term: Able to use daily as guideline for  intensity in rehab;Long Term: Able to use THRR to govern intensity when exercising independently       Able to check pulse independently  Yes       Intervention  Provide education and demonstration on how to check pulse in carotid and radial arteries.;Review the importance of being able to check your own pulse for safety during independent exercise       Expected Outcomes  Short Term: Able to explain why pulse checking is important during independent exercise;Long Term: Able to check pulse independently and accurately       Understanding of Exercise Prescription  Yes       Intervention  Provide education, explanation, and written materials on patient's individual exercise prescription       Expected Outcomes  Short Term: Able to explain program exercise prescription;Long Term: Able to explain home exercise prescription to exercise independently          Exercise Goals Re-Evaluation: Exercise Goals Re-Evaluation    Row Name 05/20/19 0901 05/26/19 1327 06/01/19 0932 06/10/19 0828 06/12/19 0851     Exercise Goal Re-Evaluation   Exercise Goals Review  Increase Physical Activity;Able to understand and use rate of perceived exertion (RPE) scale;Knowledge and understanding of Target Heart Rate Range (THRR);Understanding of Exercise Prescription;Increase Strength and Stamina;Able to understand and use Dyspnea scale  Increase Physical Activity;Increase Strength and Stamina;Able to understand and use rate of perceived exertion (RPE) scale;Knowledge and understanding of Target Heart Rate Range (THRR);Able to check pulse independently;Understanding of Exercise Prescription  Increase Physical Activity;Increase Strength and Stamina;Able to understand and use rate of perceived exertion (RPE) scale;Knowledge and understanding of Target Heart Rate Range (THRR);Able to check pulse independently;Understanding of Exercise Prescription  Increase Physical Activity;Increase Strength and Stamina;Understanding of Exercise  Prescription  Increase Physical Activity;Increase Strength and Stamina;Understanding of Exercise Prescription   Comments  Reviewed RPE scale, THR and program prescription with pt today.  Pt voiced understanding and was given a copy of goals to take home.  Ronalee Belts has been working at [employer] 11-13 but not quite in THR range.  Staff will monitor progress  Reviewed home exercise with pt today.  Pt plans to walk at home for exercise.  Reviewed THR, pulse, RPE, sign and symptoms, NTG use, and when to call 911  or MD.  Also discussed weather considerations and indoor options.  Pt voiced understanding.  Ronalee Belts is doing well in rehab.  He is now up to 5.3 METs on the XR. He is determined to beat the elliptical, currently he is needing a couple of rest breaks, but they are getting less and less.  We will continue to monitor his progress.  Ronalee Belts is doing his home exercise on the days he is not here.  He is walks at least a mile each day.  He is eager to get back to work.  His strength and stamina are not quite back to he wants them to be for work. We talked about adding in stairs to work on his stamina. He is improving though.   Expected Outcomes  Short: Use RPE daily to regulate intensity. Long: Follow program prescription in THR.  Short - increase levels to achieve THR (if meds allow) Long - increase overall stamina  Short: Start to walk on off days.  Long: Exercise independently  Short: Talk about intervals on seated equipment.  Long: Continue to exercise at home on off days.  Short: Continue to walk on off days.  Long: Continue to improve stamina.   [place] Name 06/22/19 1320             Exercise Goal Re-Evaluation   Exercise Goals Review  Increase Physical Activity;Increase Strength and Stamina;Able to understand and use rate of perceived exertion (RPE) scale;Knowledge and understanding of Target Heart Rate Range (THRR);Able to check pulse independently;Understanding of Exercise Prescription       Comments  Ronalee Belts has moved up  levels on most machines.  Staff will continue to monitor progress.       Expected Outcomes  Short - continue to improve levels on machines Long - increase MET level          Nutrition & Weight - Outcomes: Pre Biometrics - 05/18/19 1039      Pre Biometrics   Height  6' 1.6" (1.869 m)    Weight  193 lb (87.5 kg)    BMI (Calculated)  25.06    Single Leg Stand  30 seconds        Nutrition: Nutrition Therapy & Goals - 05/19/19 1201      Nutrition Therapy   Diet  Low Na, HH diet    Drug/Food Interactions  Purine/Gout    Protein (specify units)  70g    Fiber  25 grams    Whole Grain Foods  3 servings    Saturated Fats  12 max. grams    Fruits and Vegetables  5 servings/day    Sodium  1.5 grams      Personal Nutrition Goals   Nutrition Goal  ST: Cut back red meat to 2-3x per week LT: To go back to work    Comments  B: cereal (frosted flakes)w/ 1-2% milk and bananas and peaches (out of season) or go to cafe and eat sausage and egg D: 2-3 vegetables(starchy and non-starchy) and red meat, pork, chicken, salmon. and pt reports liking sweet-tea, water, and beans. S: pb&J or vanilla wafers. TV dinners and will sometimes have fried chicken. Pt reports having gout flair ups, seeing doctor regarding this again soon. Pt thinks it may be due to red meat. Discussed HH eating and suggested some changes like choosing a higher fiber cereal and reducing red meat.      Intervention Plan   Intervention  Prescribe, educate and counsel regarding individualized specific dietary modifications  aiming towards targeted core components such as weight, hypertension, lipid management, diabetes, heart failure and other comorbidities.;Nutrition handout(s) given to patient.    Expected Outcomes  Short Term Goal: Understand basic principles of dietary content, such as calories, fat, sodium, cholesterol and nutrients.;Short Term Goal: A plan has been developed with personal nutrition goals set during dietitian  appointment.;Long Term Goal: Adherence to prescribed nutrition plan.       Nutrition Discharge: Nutrition Assessments - 05/18/19 1042      MEDFICTS Scores   Pre Score  61       Education Questionnaire Score: Knowledge Questionnaire Score - 05/18/19 1043      Knowledge Questionnaire Score   Pre Score  24/26 Education Focus: Nutrition, Heart Failure       Goals reviewed with patient; copy given to patient.

## 2019-07-06 NOTE — Progress Notes (Signed)
Cardiac Individual Treatment Plan  Patient Details  Name: Edward [name] MRN: [id number] Date of Birth: 07-29-1966 Referring Provider:     Cardiac Rehab from 05/18/2019 in [facility] Cardiac and Pulmonary Rehab  Referring Provider  Ida Rogue MD      Initial Encounter Date:    Cardiac Rehab from 05/18/2019 in [facility] Cardiac and Pulmonary Rehab  Date  05/18/19      Visit Diagnosis: Heart failure, chronic systolic ([place])  Patient's Home Medications on Admission:  Current Outpatient Medications:  .  aspirin 81 MG chewable tablet, Chew 1 tablet (81 mg total) by mouth daily., Disp: 30 tablet, Rfl: 0 .  carvedilol (COREG) 12.5 MG tablet, Take 0.5 tablets (6.25 mg total) by mouth 2 (two) times daily., Disp: 180 tablet, Rfl: 2 .  colchicine 0.6 MG tablet, Take 1 tablet (0.6 mg total) by mouth 2 (two) times daily as needed., Disp: 30 tablet, Rfl: 1 .  ENTRESTO 24-26 MG, TAKE 1 TABLET BY MOUTH TWICE A DAY, Disp: 60 tablet, Rfl: 2 .  furosemide (LASIX) 40 MG tablet, Take 1 tablet (40 mg total) by mouth daily., Disp: 90 tablet, Rfl: 3 .  Omega-3 Fatty Acids (FISH OIL) 1000 MG CPDR, Take 1,000 mg by mouth daily. , Disp: , Rfl:  .  oxyCODONE-acetaminophen (PERCOCET) 5-325 MG tablet, Take 1 tablet by mouth every 6 (six) hours as needed for severe pain. (Patient not taking: Reported on 05/15/2019), Disp: 8 tablet, Rfl: 0 .  spironolactone (ALDACTONE) 25 MG tablet, Take 0.5 tablets (12.5 mg total) by mouth daily., Disp: 45 tablet, Rfl: 3 .  vitamin B-12 (CYANOCOBALAMIN) 500 MCG tablet, Take 500 mcg by mouth daily., Disp: , Rfl:  .  Zinc Sulfate (ZINC-220 PO), Take 220 mg by mouth daily. , Disp: , Rfl:   Past Medical History: Past Medical History:  Diagnosis Date  . Alcohol abuse   . Colon polyp 2016  . Herpes   . MI (myocardial infarction) ([place])   . Polysubstance abuse ([place])   . Vitamin B 12 deficiency     Tobacco Use: Social History   Tobacco Use  Smoking Status Never Smoker  Smokeless  Tobacco Never Used    Labs: Recent Review Scientist, physiological    Labs for ITP Cardiac and Pulmonary Rehab Latest Ref Rng & Units 12/17/2018   Cholestrol 0 - 200 mg/dL 174   LDLCALC 0 - 99 mg/dL UNABLE TO CALCULATE IF TRIGLYCERIDE OVER 400 mg/dL   LDLDIRECT 0 - 99 mg/dL 72.0   HDL >40 mg/dL 45   Trlycerides <150 mg/dL 457(H)   Hemoglobin A1c 4.8 - 5.6 % 5.6       Exercise Target Goals: Exercise Program Goal: Individual exercise prescription set using results from initial 6 min walk test and THRR while considering  patient's activity barriers and safety.   Exercise Prescription Goal: Initial exercise prescription builds to 30-45 minutes a day of aerobic activity, 2-3 days per week.  Home exercise guidelines will be given to patient during program as part of exercise prescription that the participant will acknowledge.  Activity Barriers & Risk Stratification: Activity Barriers & Cardiac Risk Stratification - 05/18/19 1036      Activity Barriers & Cardiac Risk Stratification   Activity Barriers  Joint Problems;Decreased Ventricular Function;Deconditioning;History of Falls;Muscular Weakness   L knee pain on occasion   Cardiac Risk Stratification  High       6 Minute Walk: 6 Minute Walk    Row Name 05/18/19 1035  6 Minute Walk   Phase  Initial     Distance  1370 feet     Walk Time  6 minutes     # of Rest Breaks  0     MPH  2.6     METS  4.16     RPE  9     VO2 Peak  14.55     Symptoms  No     Resting HR  67 bpm     Resting BP  142/76     Resting Oxygen Saturation   97 %     Exercise Oxygen Saturation  during 6 min walk  96 %     Max Ex. HR  89 bpm     Max Ex. BP  154/70     2 Minute Post BP  134/70        Oxygen Initial Assessment:   Oxygen Re-Evaluation:   Oxygen Discharge (Final Oxygen Re-Evaluation):   Initial Exercise Prescription: Initial Exercise Prescription - 05/18/19 1000      Date of Initial Exercise RX and Referring Provider   Date   05/18/19    Referring Provider  Ida Rogue MD      Treadmill   MPH  2.5    Grade  2    Minutes  15    METs  3.5      NuStep   Level  3    SPM  80    Minutes  15    METs  3      Recumbant Elliptical   Level  2    RPM  50    Minutes  15    METs  3      Elliptical   Level  1    Speed  3    Minutes  15      REL-XR   Level  3    Speed  50    Minutes  15    METs  3      T5 Nustep   Level  3    SPM  80    Minutes  15    METs  3      Prescription Details   Frequency (times per week)  3    Duration  Progress to 30 minutes of continuous aerobic without signs/symptoms of physical distress      Intensity   THRR 40-80% of Max Heartrate  106-145    Ratings of Perceived Exertion  11-13    Perceived Dyspnea  0-4      Progression   Progression  Continue to progress workloads to maintain intensity without signs/symptoms of physical distress.      Resistance Training   Training Prescription  Yes    Weight  6 lbs    Reps  10-15       Perform Capillary Blood Glucose checks as needed.  Exercise Prescription Changes: Exercise Prescription Changes    Row Name 05/18/19 1000 05/26/19 1300 06/01/19 0900 06/10/19 0800 06/22/19 1300     Response to Exercise   Blood Pressure (Admit)  142/76  110/52  -  118/70  122/70   Blood Pressure (Exercise)  154/70  142/72  -  150/68  124/70   Blood Pressure (Exit)  134/72  118/62  -  120/62  110/70   Heart Rate (Admit)  67 bpm  86 bpm  -  52 bpm  74 bpm   Heart Rate (Exercise)  89 bpm  117  bpm  -  145 bpm  107 bpm   Heart Rate (Exit)  73 bpm  86 bpm  -  79 bpm  93 bpm   Oxygen Saturation (Admit)  97 %  -  -  -  -   Oxygen Saturation (Exercise)  96 %  -  -  -  -   Rating of Perceived Exertion (Exercise)  9  -  -  16  12   Symptoms  none  none  -  none  none   Comments  walk test results  -  -  -  -   Duration  -  Continue with 30 min of aerobic exercise without signs/symptoms of physical distress.  -  Continue with 30 min of  aerobic exercise without signs/symptoms of physical distress.  Continue with 30 min of aerobic exercise without signs/symptoms of physical distress.   Intensity  -  THRR unchanged  -  THRR unchanged  THRR unchanged     Progression   Progression  -  Continue to progress workloads to maintain intensity without signs/symptoms of physical distress.  -  Continue to progress workloads to maintain intensity without signs/symptoms of physical distress.  Continue to progress workloads to maintain intensity without signs/symptoms of physical distress.   Average METs  -  3  -  3.4  3.5     Resistance Training   Training Prescription  -  Yes  -  Yes  Yes   Weight  -  6 lbs  -  6 lbs  6 lb   Reps  -  10-15  -  10-15  10-15     Interval Training   Interval Training  -  No  -  No  No     Treadmill   MPH  -  -  -  2.5  2.5   Grade  -  -  -  2  2   Minutes  -  -  -  15  15   METs  -  -  -  3.5  3.6     NuStep   Level  -  -  -  4  -   Minutes  -  -  -  15  -   METs  -  -  -  3  -     Recumbant Elliptical   Level  -  -  -  2  -   Minutes  -  -  -  15  -   METs  -  -  -  2.5  -     Elliptical   Level  -  -  -  1  2   Speed  -  -  -  4  -   Minutes  -  -  -  15 with rest breaks  15     REL-XR   Level  -  3  -  3  3   Speed  -  50  -  -  -   Minutes  -  15  -  15  15   METs  -  3.9  -  5.2  4     T5 Nustep   Level  -  3  -  3  3   SPM  -  80  -  -  -   Minutes  -  15  -  15  15   METs  -  2  -  2.8  2.7     Home Exercise Plan   Plans to continue exercise at  -  -  Home (comment) walking  Home (comment) walking  Home (comment) walking   Frequency  -  -  Add 2 additional days to program exercise sessions.  Add 2 additional days to program exercise sessions.  Add 2 additional days to program exercise sessions.   Initial Home Exercises Provided  -  -  06/01/19  06/01/19  06/01/19      Exercise Comments: Exercise Comments    Row Name 05/20/19 0901 05/27/19 0930         Exercise  Comments  First full day of exercise!  Patient was oriented to gym and equipment including functions, settings, policies, and procedures.  Patient's individual exercise prescription and treatment plan were reviewed.  All starting workloads were established based on the results of the 6 minute walk test done at initial orientation visit.  The plan for exercise progression was also introduced and progression will be customized based on patient's performance and goals.  s working on time with the elliptical  Completed 5 min today  Hip and leg pain with exercise         Exercise Goals and Review: Exercise Goals    Row Name 05/18/19 1038             Exercise Goals   Increase Physical Activity  Yes       Intervention  Provide advice, education, support and counseling about physical activity/exercise needs.;Develop an individualized exercise prescription for aerobic and resistive training based on initial evaluation findings, risk stratification, comorbidities and participant's personal goals.       Expected Outcomes  Short Term: Attend rehab on a regular basis to increase amount of physical activity.;Long Term: Add in home exercise to make exercise part of routine and to increase amount of physical activity.;Long Term: Exercising regularly at least 3-5 days a week.       Increase Strength and Stamina  Yes       Intervention  Provide advice, education, support and counseling about physical activity/exercise needs.;Develop an individualized exercise prescription for aerobic and resistive training based on initial evaluation findings, risk stratification, comorbidities and participant's personal goals.       Expected Outcomes  Short Term: Perform resistance training exercises routinely during rehab and add in resistance training at home;Short Term: Increase workloads from initial exercise prescription for resistance, speed, and METs.;Long Term: Improve cardiorespiratory fitness, muscular endurance and  strength as measured by increased METs and functional capacity (6MWT)       Able to understand and use rate of perceived exertion (RPE) scale  Yes       Intervention  Provide education and explanation on how to use RPE scale       Expected Outcomes  Short Term: Able to use RPE daily in rehab to express subjective intensity level;Long Term:  Able to use RPE to guide intensity level when exercising independently       Knowledge and understanding of Target Heart Rate Range (THRR)  Yes       Intervention  Provide education and explanation of THRR including how the numbers were predicted and where they are located for reference       Expected Outcomes  Short Term: Able to state/look up THRR;Short Term: Able to use daily as guideline for intensity in rehab;Long Term: Able to use THRR to govern intensity when exercising independently  Able to check pulse independently  Yes       Intervention  Provide education and demonstration on how to check pulse in carotid and radial arteries.;Review the importance of being able to check your own pulse for safety during independent exercise       Expected Outcomes  Short Term: Able to explain why pulse checking is important during independent exercise;Long Term: Able to check pulse independently and accurately       Understanding of Exercise Prescription  Yes       Intervention  Provide education, explanation, and written materials on patient's individual exercise prescription       Expected Outcomes  Short Term: Able to explain program exercise prescription;Long Term: Able to explain home exercise prescription to exercise independently          Exercise Goals Re-Evaluation : Exercise Goals Re-Evaluation    Row Name 05/20/19 0901 05/26/19 1327 06/01/19 0932 06/10/19 0828 06/12/19 0851     Exercise Goal Re-Evaluation   Exercise Goals Review  Increase Physical Activity;Able to understand and use rate of perceived exertion (RPE) scale;Knowledge and understanding  of Target Heart Rate Range (THRR);Understanding of Exercise Prescription;Increase Strength and Stamina;Able to understand and use Dyspnea scale  Increase Physical Activity;Increase Strength and Stamina;Able to understand and use rate of perceived exertion (RPE) scale;Knowledge and understanding of Target Heart Rate Range (THRR);Able to check pulse independently;Understanding of Exercise Prescription  Increase Physical Activity;Increase Strength and Stamina;Able to understand and use rate of perceived exertion (RPE) scale;Knowledge and understanding of Target Heart Rate Range (THRR);Able to check pulse independently;Understanding of Exercise Prescription  Increase Physical Activity;Increase Strength and Stamina;Understanding of Exercise Prescription  Increase Physical Activity;Increase Strength and Stamina;Understanding of Exercise Prescription   Comments  Reviewed RPE scale, THR and program prescription with pt today.  Pt voiced understanding and was given a copy of goals to take home.  Edward [name] has been working at [employer] 11-13 but not quite in THR range.  Staff will monitor progress  Reviewed home exercise with pt today.  Pt plans to walk at home for exercise.  Reviewed THR, pulse, RPE, sign and symptoms, NTG use, and when to call 911 or MD.  Also discussed weather considerations and indoor options.  Pt voiced understanding.  Edward [name] is doing well in rehab.  He is now up to 5.3 METs on the XR. He is determined to beat the elliptical, currently he is needing a couple of rest breaks, but they are getting less and less.  We will continue to monitor his progress.  Edward [name] is doing his home exercise on the days he is not here.  He is walks at least a mile each day.  He is eager to get back to work.  His strength and stamina are not quite back to he wants them to be for work. We talked about adding in stairs to work on his stamina. He is improving though.   Expected Outcomes  Short: Use RPE daily to regulate intensity. Long: Follow  program prescription in THR.  Short - increase levels to achieve THR (if meds allow) Long - increase overall stamina  Short: Start to walk on off days.  Long: Exercise independently  Short: Talk about intervals on seated equipment.  Long: Continue to exercise at home on off days.  Short: Continue to walk on off days.  Long: Continue to improve stamina.   [place] Name 06/22/19 1320             Exercise Goal Re-Evaluation  Exercise Goals Review  Increase Physical Activity;Increase Strength and Stamina;Able to understand and use rate of perceived exertion (RPE) scale;Knowledge and understanding of Target Heart Rate Range (THRR);Able to check pulse independently;Understanding of Exercise Prescription       Comments  Edward [name] has moved up levels on most machines.  Staff will continue to monitor progress.       Expected Outcomes  Short - continue to improve levels on machines Long - increase MET level          Discharge Exercise Prescription (Final Exercise Prescription Changes): Exercise Prescription Changes - 06/22/19 1300      Response to Exercise   Blood Pressure (Admit)  122/70    Blood Pressure (Exercise)  124/70    Blood Pressure (Exit)  110/70    Heart Rate (Admit)  74 bpm    Heart Rate (Exercise)  107 bpm    Heart Rate (Exit)  93 bpm    Rating of Perceived Exertion (Exercise)  12    Symptoms  none    Duration  Continue with 30 min of aerobic exercise without signs/symptoms of physical distress.    Intensity  THRR unchanged      Progression   Progression  Continue to progress workloads to maintain intensity without signs/symptoms of physical distress.    Average METs  3.5      Resistance Training   Training Prescription  Yes    Weight  6 lb    Reps  10-15      Interval Training   Interval Training  No      Treadmill   MPH  2.5    Grade  2    Minutes  15    METs  3.6      Elliptical   Level  2    Minutes  15      REL-XR   Level  3    Minutes  15    METs  4      T5  Nustep   Level  3    Minutes  15    METs  2.7      Home Exercise Plan   Plans to continue exercise at  Home (comment)   walking   Frequency  Add 2 additional days to program exercise sessions.    Initial Home Exercises Provided  06/01/19       Nutrition:  Target Goals: Understanding of nutrition guidelines, daily intake of sodium '1500mg'$ , cholesterol '200mg'$ , calories 30% from fat and 7% or less from saturated fats, daily to have 5 or more servings of fruits and vegetables.  Biometrics: Pre Biometrics - 05/18/19 1039      Pre Biometrics   Height  6' 1.6" (1.869 m)    Weight  193 lb (87.5 kg)    BMI (Calculated)  25.06    Single Leg Stand  30 seconds        Nutrition Therapy Plan and Nutrition Goals: Nutrition Therapy & Goals - 05/19/19 1201      Nutrition Therapy   Diet  Low Na, HH diet    Drug/Food Interactions  Purine/Gout    Protein (specify units)  70g    Fiber  25 grams    Whole Grain Foods  3 servings    Saturated Fats  12 max. grams    Fruits and Vegetables  5 servings/day    Sodium  1.5 grams      Personal Nutrition Goals   Nutrition Goal  ST: Cut  back red meat to 2-3x per week LT: To go back to work    Comments  B: cereal (frosted flakes)w/ 1-2% milk and bananas and peaches (out of season) or go to cafe and eat sausage and egg D: 2-3 vegetables(starchy and non-starchy) and red meat, pork, chicken, salmon. and pt reports liking sweet-tea, water, and beans. S: pb&J or vanilla wafers. TV dinners and will sometimes have fried chicken. Pt reports having gout flair ups, seeing doctor regarding this again soon. Pt thinks it may be due to red meat. Discussed HH eating and suggested some changes like choosing a higher fiber cereal and reducing red meat.      Intervention Plan   Intervention  Prescribe, educate and counsel regarding individualized specific dietary modifications aiming towards targeted core components such as weight, hypertension, lipid management,  diabetes, heart failure and other comorbidities.;Nutrition handout(s) given to patient.    Expected Outcomes  Short Term Goal: Understand basic principles of dietary content, such as calories, fat, sodium, cholesterol and nutrients.;Short Term Goal: A plan has been developed with personal nutrition goals set during dietitian appointment.;Long Term Goal: Adherence to prescribed nutrition plan.       Nutrition Assessments: Nutrition Assessments - 05/18/19 1042      MEDFICTS Scores   Pre Score  61       Nutrition Goals Re-Evaluation: Nutrition Goals Re-Evaluation    Row Name 06/08/19 0930 06/08/19 0932           Goals   Nutrition Goal  ST: Cut back red meat to 2-3x per week LT: To go back to work  ST: Cut back red meat to 2-3x per week and continue with whole wheat cereal LT: To go back to work      Comment  -  Pt has switched to having red meat twice a week. Pt reports his R foot has been hurting him - could be gout flare up. Pt reports his wirst has also been hurting him, but he has had previous injuries with it. Pt reports changed his cereal from frosted flakes to cheerios. Pt reports he is doing well and would like to continue with current changes for the next 30 days.      Expected Outcome  -  ST: Cut back red meat to 2-3x per week and continue with whole wheat cereal LT: To go back to work         Nutrition Goals Discharge (Final Nutrition Goals Re-Evaluation): Nutrition Goals Re-Evaluation - 06/08/19 0932      Goals   Nutrition Goal  ST: Cut back red meat to 2-3x per week and continue with whole wheat cereal LT: To go back to work    Comment  Pt has switched to having red meat twice a week. Pt reports his R foot has been hurting him - could be gout flare up. Pt reports his wirst has also been hurting him, but he has had previous injuries with it. Pt reports changed his cereal from frosted flakes to cheerios. Pt reports he is doing well and would like to continue with current  changes for the next 30 days.    Expected Outcome  ST: Cut back red meat to 2-3x per week and continue with whole wheat cereal LT: To go back to work       Psychosocial: Target Goals: Acknowledge presence or absence of significant depression and/or stress, maximize coping skills, provide positive support system. Participant is able to verbalize types and ability to use  techniques and skills needed for reducing stress and depression.   Initial Review & Psychosocial Screening: Initial Psych Review & Screening - 05/15/19 1124      Initial Review   Current issues with  None Identified      Family Dynamics   Good Support System?  Yes   Friends      does live alone, sister and her husband     Barriers   Psychosocial barriers to participate in program  There are no identifiable barriers or psychosocial needs.      Screening Interventions   Interventions  Encouraged to exercise    Expected Outcomes  Short Term goal: Utilizing psychosocial counselor, staff and physician to assist with identification of specific Stressors or current issues interfering with healing process. Setting desired goal for each stressor or current issue identified.;Long Term Goal: Stressors or current issues are controlled or eliminated.;Short Term goal: Identification and review with participant of any Quality of Life or Depression concerns found by scoring the questionnaire.;Long Term goal: The participant improves quality of Life and PHQ9 Scores as seen by post scores and/or verbalization of changes       Quality of Life Scores:  Quality of Life - 05/18/19 1041      Quality of Life   Select  Quality of Life      Quality of Life Scores   Health/Function Pre  22.21 %    Socioeconomic Pre  18.69 %    Psych/Spiritual Pre  24.21 %    Family Pre  17.63 %    GLOBAL Pre  21.23 %      Scores of 19 and below usually indicate a poorer quality of life in these areas.  A difference of  2-3 points is a clinically  meaningful difference.  A difference of 2-3 points in the total score of the Quality of Life Index has been associated with significant improvement in overall quality of life, self-image, physical symptoms, and general health in studies assessing change in quality of life.  PHQ-9: Recent Review Flowsheet Data    Depression screen [facility] 2/9 05/18/2019   Decreased Interest 0   Down, Depressed, Hopeless 1   PHQ - 2 Score 1   Altered sleeping 2   Tired, decreased energy 0   Change in appetite 0   Feeling bad or failure about yourself  0   Trouble concentrating 0   Moving slowly or fidgety/restless 0   Suicidal thoughts 0   PHQ-9 Score 3     Interpretation of Total Score  Total Score Depression Severity:  1-4 = Minimal depression, 5-9 = Mild depression, 10-14 = Moderate depression, 15-19 = Moderately severe depression, 20-27 = Severe depression   Psychosocial Evaluation and Intervention: Psychosocial Evaluation - 05/15/19 1142      Psychosocial Evaluation & Interventions   Comments  Edward [name] has no voiced concerns with depression, sress or anxiety. He is eager to start the program so he can feel better.  He does have good days and bad days.  Edward [name] lives alone and has friends and family nearby that are there to help him if needed. He has a positive attitude about starting the program.    Expected Outcomes  STG: Edward [name] will attend all sessions  LTG Edward [name] will be able to take the resources and education provided to continue to live a heart healthy life.    Continue Psychosocial Services   Follow up required by staff       Psychosocial Re-Evaluation: Psychosocial  Re-Evaluation    Row Name 06/12/19 0848             Psychosocial Re-Evaluation   Current issues with  Current Sleep Concerns;Current Stress Concerns       Comments  Edward [name] is going doctor on Tuesday to talk about chaning his sleep medication.  He used to use Xanax which worked.  Currently on trazadone, but it is not working.  He really  hopes that it will get better.  His foot is feeling better, but now his elbow is swollen so he will show them that too. He is doing well otherwise.       Expected Outcomes  Short: Talk to doctor about sleep.  Long: Continue to work on improved sleep.       Interventions  Encouraged to attend Cardiac Rehabilitation for the exercise       Continue Psychosocial Services   Follow up required by staff         Initial Review   Source of Stress Concerns  Chronic Illness          Psychosocial Discharge (Final Psychosocial Re-Evaluation): Psychosocial Re-Evaluation - 06/12/19 0848      Psychosocial Re-Evaluation   Current issues with  Current Sleep Concerns;Current Stress Concerns    Comments  Edward [name] is going doctor on Tuesday to talk about chaning his sleep medication.  He used to use Xanax which worked.  Currently on trazadone, but it is not working.  He really hopes that it will get better.  His foot is feeling better, but now his elbow is swollen so he will show them that too. He is doing well otherwise.    Expected Outcomes  Short: Talk to doctor about sleep.  Long: Continue to work on improved sleep.    Interventions  Encouraged to attend Cardiac Rehabilitation for the exercise    Continue Psychosocial Services   Follow up required by staff      Initial Review   Source of Stress Concerns  Chronic Illness       Vocational Rehabilitation: Provide vocational rehab assistance to qualifying candidates.   Vocational Rehab Evaluation & Intervention: Vocational Rehab - 05/15/19 1126      Initial Vocational Rehab Evaluation & Intervention   Assessment shows need for Vocational Rehabilitation  No       Education: Education Goals: Education classes will be provided on a variety of topics geared toward better understanding of heart health and risk factor modification. Participant will state understanding/return demonstration of topics presented as noted by education test scores.  Learning  Barriers/Preferences: Learning Barriers/Preferences - 05/15/19 1126      Learning Barriers/Preferences   Learning Barriers  None    Learning Preferences  None       Education Topics:  AED/CPR: - Group verbal and written instruction with the use of models to demonstrate the basic use of the AED with the basic ABC's of resuscitation.   General Nutrition Guidelines/Fats and Fiber: -Group instruction provided by verbal, written material, models and posters to present the general guidelines for heart healthy nutrition. Gives an explanation and review of dietary fats and fiber.   Cardiac Rehab from 06/17/2019 in [facility] Cardiac and Pulmonary Rehab  Date  06/17/19  Educator  MC,RD  Instruction Review Code  1- Verbalizes Understanding      Controlling Sodium/Reading Food Labels: -Group verbal and written material supporting the discussion of sodium use in heart healthy nutrition. Review and explanation with models, verbal and written materials for  utilization of the food label.   Exercise Physiology & General Exercise Guidelines: - Group verbal and written instruction with models to review the exercise physiology of the cardiovascular system and associated critical values. Provides general exercise guidelines with specific guidelines to those with heart or lung disease.    Aerobic Exercise & Resistance Training: - Gives group verbal and written instruction on the various components of exercise. Focuses on aerobic and resistive training programs and the benefits of this training and how to safely progress through these programs..   Cardiac Rehab from 06/17/2019 in [facility] Cardiac and Pulmonary Rehab  Date  05/20/19  Educator  AS  Instruction Review Code  1- Verbalizes Understanding      Flexibility, Balance, Mind/Body Relaxation: Provides group verbal/written instruction on the benefits of flexibility and balance training, including mind/body exercise modes such as yoga, pilates and tai  chi.  Demonstration and skill practice provided.   Cardiac Rehab from 06/17/2019 in [facility] Cardiac and Pulmonary Rehab  Date  06/17/19  Educator  AS  Instruction Review Code  1- Verbalizes Understanding      Stress and Anxiety: - Provides group verbal and written instruction about the health risks of elevated stress and causes of high stress.  Discuss the correlation between heart/lung disease and anxiety and treatment options. Review healthy ways to manage with stress and anxiety.   Depression: - Provides group verbal and written instruction on the correlation between heart/lung disease and depressed mood, treatment options, and the stigmas associated with seeking treatment.   Anatomy & Physiology of the Heart: - Group verbal and written instruction and models provide basic cardiac anatomy and physiology, with the coronary electrical and arterial systems. Review of Valvular disease and Heart Failure   Cardiac Procedures: - Group verbal and written instruction to review commonly prescribed medications for heart disease. Reviews the medication, class of the drug, and side effects. Includes the steps to properly store meds and maintain the prescription regimen. (beta blockers and nitrates)   Cardiac Medications I: - Group verbal and written instruction to review commonly prescribed medications for heart disease. Reviews the medication, class of the drug, and side effects. Includes the steps to properly store meds and maintain the prescription regimen.   Cardiac Medications II: -Group verbal and written instruction to review commonly prescribed medications for heart disease. Reviews the medication, class of the drug, and side effects. (all other drug classes)   Cardiac Rehab from 06/17/2019 in [facility] Cardiac and Pulmonary Rehab  Date  05/20/19  Educator  SB  Instruction Review Code  1- Verbalizes Understanding       Go Sex-Intimacy & Heart Disease, Get SMART - Goal Setting: - Group  verbal and written instruction through game format to discuss heart disease and the return to sexual intimacy. Provides group verbal and written material to discuss and apply goal setting through the application of the S.M.A.R.T. Method.   Other Matters of the Heart: - Provides group verbal, written materials and models to describe Stable Angina and Peripheral Artery. Includes description of the disease process and treatment options available to the cardiac patient.   Exercise & Equipment Safety: - Individual verbal instruction and demonstration of equipment use and safety with use of the equipment.   Cardiac Rehab from 06/17/2019 in [facility] Cardiac and Pulmonary Rehab  Date  05/18/19  Educator  [facility]  Instruction Review Code  1- Verbalizes Understanding      Infection Prevention: - Provides verbal and written material to individual with discussion  of infection control including proper hand washing and proper equipment cleaning during exercise session.   Cardiac Rehab from 06/17/2019 in [facility] Cardiac and Pulmonary Rehab  Date  05/18/19  Educator  [facility]  Instruction Review Code  1- Verbalizes Understanding      Falls Prevention: - Provides verbal and written material to individual with discussion of falls prevention and safety.   Cardiac Rehab from 06/17/2019 in [facility] Cardiac and Pulmonary Rehab  Date  05/18/19  Educator  [facility]  Instruction Review Code  1- Verbalizes Understanding      Diabetes: - Individual verbal and written instruction to review signs/symptoms of diabetes, desired ranges of glucose level fasting, after meals and with exercise. Acknowledge that pre and post exercise glucose checks will be done for 3 sessions at entry of program.   Know Your Numbers and Risk Factors: -Group verbal and written instruction about important numbers in your health.  Discussion of what are risk factors and how they play a role in the disease process.  Review of Cholesterol, Blood Pressure,  Diabetes, and BMI and the role they play in your overall health.   Cardiac Rehab from 06/17/2019 in [facility] Cardiac and Pulmonary Rehab  Date  05/20/19  Educator  SB  Instruction Review Code  1- Verbalizes Understanding      Sleep Hygiene: -Provides group verbal and written instruction about how sleep can affect your health.  Define sleep hygiene, discuss sleep cycles and impact of sleep habits. Review good sleep hygiene tips.    Other: -Provides group and verbal instruction on various topics (see comments)   Knowledge Questionnaire Score: Knowledge Questionnaire Score - 05/18/19 1043      Knowledge Questionnaire Score   Pre Score  24/26 Education Focus: Nutrition, Heart Failure       Core Components/Risk Factors/Patient Goals at Admission: Personal Goals and Risk Factors at Admission - 05/18/19 1043      Core Components/Risk Factors/Patient Goals on Admission    Weight Management  Yes;Weight Loss    Intervention  Weight Management: Develop a combined nutrition and exercise program designed to reach desired caloric intake, while maintaining appropriate intake of nutrient and fiber, sodium and fats, and appropriate energy expenditure required for the weight goal.;Weight Management: Provide education and appropriate resources to help participant work on and attain dietary goals.    Admit Weight  193 lb (87.5 kg)    Goal Weight: Short Term  190 lb (86.2 kg)    Goal Weight: Long Term  188 lb (85.3 kg)    Expected Outcomes  Short Term: Continue to assess and modify interventions until short term weight is achieved;Long Term: Adherence to nutrition and physical activity/exercise program aimed toward attainment of established weight goal;Weight Loss: Understanding of general recommendations for a balanced deficit meal plan, which promotes 1-2 lb weight loss per week and includes a negative energy balance of [phone] kcal/d;Understanding recommendations for meals to include 15-35% energy as  protein, 25-35% energy from fat, 35-60% energy from carbohydrates, less than '200mg'$  of dietary cholesterol, 20-35 gm of total fiber daily    Heart Failure  Yes    Intervention  Provide a combined exercise and nutrition program that is supplemented with education, support and counseling about heart failure. Directed toward relieving symptoms such as shortness of breath, decreased exercise tolerance, and extremity edema.    Expected Outcomes  Improve functional capacity of life;Short term: Attendance in program 2-3 days a week with increased exercise capacity. Reported lower sodium intake. Reported increased fruit  and vegetable intake. Reports medication compliance.;Short term: Daily weights obtained and reported for increase. Utilizing diuretic protocols set by physician.;Long term: Adoption of self-care skills and reduction of barriers for early signs and symptoms recognition and intervention leading to self-care maintenance.    Hypertension  Yes    Intervention  Provide education on lifestyle modifcations including regular physical activity/exercise, weight management, moderate sodium restriction and increased consumption of fresh fruit, vegetables, and low fat dairy, alcohol moderation, and smoking cessation.;Monitor prescription use compliance.    Expected Outcomes  Short Term: Continued assessment and intervention until BP is < 140/66m HG in hypertensive participants. < 130/847mHG in hypertensive participants with diabetes, heart failure or chronic kidney disease.;Long Term: Maintenance of blood pressure at goal levels.       Core Components/Risk Factors/Patient Goals Review:  Goals and Risk Factor Review    Row Name 06/12/19 0850             Core Components/Risk Factors/Patient Goals Review   Personal Goals Review  Weight Management/Obesity;Hypertension;Heart Failure       Review  Edward [name] doing well in rehab.  His weight is up a little and he checks it daily.  He has a little soreness on  the side of his chest from pulling the other day.  Overall, no symptoms.  Blood pressures have been good and checks them at home regularly.  His elbow is swollen and he will talk to doctor about that on Tuesday when he goes.       Expected Outcomes  Short: Talk about elbow.  Long: Continue to work on weight loss.          Core Components/Risk Factors/Patient Goals at Discharge (Final Review):  Goals and Risk Factor Review - 06/12/19 0850      Core Components/Risk Factors/Patient Goals Review   Personal Goals Review  Weight Management/Obesity;Hypertension;Heart Failure    Review  Edward [name] doing well in rehab.  His weight is up a little and he checks it daily.  He has a little soreness on the side of his chest from pulling the other day.  Overall, no symptoms.  Blood pressures have been good and checks them at home regularly.  His elbow is swollen and he will talk to doctor about that on Tuesday when he goes.    Expected Outcomes  Short: Talk about elbow.  Long: Continue to work on weight loss.       ITP Comments: ITP Comments    Row Name 05/15/19 1141 05/18/19 1034 05/19/19 1220 06/03/19 0627 07/01/19 0943   ITP Comments  elephone orientation completed today. Has apt on Mon 10/19 for EP/RD evals and gym orientation.  [facility]  Completed 6MWT and gym orientation.  Initial ITP created and sent for review to Dr. MaEmily FilbertMedical Director.  Completed Initial RD eval.  30 day review completed. Continue with ITP sent to Dr. MaEmily FilbertMedical Director of Cardiac and Pulmonary Rehab for review , changes as needed and signature.  30 day review competed . ITP sent to Dr MaEmily Filbertor review, changes as needed and ITP approval signature.   [place] 07/06/19 0914           ITP Comments  Discharged  Return to work          Comments: discharged  Return to work

## 2019-07-20 ENCOUNTER — Emergency Department: Payer: No Typology Code available for payment source

## 2019-07-20 ENCOUNTER — Encounter: Payer: Self-pay | Admitting: Medical Oncology

## 2019-07-20 ENCOUNTER — Emergency Department
Admission: EM | Admit: 2019-07-20 | Discharge: 2019-07-20 | Disposition: A | Discharge status: Home / Self Care | Payer: No Typology Code available for payment source | Attending: Emergency Medicine | Admitting: Emergency Medicine

## 2019-07-20 ENCOUNTER — Other Ambulatory Visit: Payer: Self-pay

## 2019-07-20 DIAGNOSIS — I252 Old myocardial infarction: Secondary | ICD-10-CM | POA: Diagnosis not present

## 2019-07-20 DIAGNOSIS — Y999 Unspecified external cause status: Secondary | ICD-10-CM | POA: Diagnosis not present

## 2019-07-20 DIAGNOSIS — Y92008 Other place in unspecified non-institutional (private) residence as the place of occurrence of the external cause: Secondary | ICD-10-CM | POA: Diagnosis not present

## 2019-07-20 DIAGNOSIS — Y939 Activity, unspecified: Secondary | ICD-10-CM | POA: Insufficient documentation

## 2019-07-20 DIAGNOSIS — Z7982 Long term (current) use of aspirin: Secondary | ICD-10-CM | POA: Diagnosis not present

## 2019-07-20 DIAGNOSIS — Z79899 Other long term (current) drug therapy: Secondary | ICD-10-CM | POA: Diagnosis not present

## 2019-07-20 DIAGNOSIS — S63502A Unspecified sprain of left wrist, initial encounter: Secondary | ICD-10-CM | POA: Diagnosis not present

## 2019-07-20 DIAGNOSIS — W010XXA Fall on same level from slipping, tripping and stumbling without subsequent striking against object, initial encounter: Secondary | ICD-10-CM | POA: Diagnosis not present

## 2019-07-20 DIAGNOSIS — S6992XA Unspecified injury of left wrist, hand and finger(s), initial encounter: Secondary | ICD-10-CM | POA: Diagnosis present

## 2019-07-20 MED ORDER — TRAMADOL HCL 50 MG PO TABS: 50.0000 mg | ORAL_TABLET | Freq: Four times a day (QID) | ORAL | 0 refills | Status: AC | PRN

## 2019-07-20 MED ORDER — LIDOCAINE 5 % EX PTCH
1.0000 | MEDICATED_PATCH | CUTANEOUS | Status: DC
  Administered 2019-07-20: 08:00:00 1 via TRANSDERMAL
  Filled 2019-07-20: qty 1

## 2019-07-20 MED ORDER — TRAMADOL HCL 50 MG PO TABS
50.0000 mg | ORAL_TABLET | Freq: Once | ORAL | Status: AC
  Administered 2019-07-20: 08:00:00 50 mg via ORAL
  Filled 2019-07-20: qty 1

## 2019-07-20 MED ORDER — IBUPROFEN 600 MG PO TABS: 600.0000 mg | ORAL_TABLET | Freq: Three times a day (TID) | ORAL | 0 refills | Status: DC | PRN

## 2019-07-20 NOTE — ED Provider Notes (Signed)
[facility] Emergency Department Provider Note   ____________________________________________   First MD Initiated Contact with Patient 07/20/19 [phone]     (approximate)  I have reviewed the triage vital signs and the nursing notes.   HISTORY  Chief Complaint Wrist Pain    HPI Edward [name] is a 53 y.o. male patient complain of left wrist pain secondary to a slip and fall which occurred 2 days ago.  Patient states slipped on a wet porch but denies LOC or head injury.  Patient rates pain as a 10/10.  Patient described the pain as "achy".  No palliative measure for complaint.  Patient is right-hand dominant.         Past Medical History:  Diagnosis Date  . Alcohol abuse   . Colon polyp 2016  . Herpes   . MI (myocardial infarction) (HCC)   . Polysubstance abuse (HCC)   . Vitamin B 12 deficiency     Patient Active Problem List   Diagnosis Date Noted  . Dilated cardiomyopathy (HCC) 01/08/2019  . Polysubstance abuse (HCC) 01/08/2019  . Anxiety 01/08/2019  . Chest pain 12/17/2018  . Leg pain 08/07/2018  . Neuropathy 06/23/2018  . Varicose veins of both lower extremities 06/23/2018  . Bilateral cold feet 06/23/2018    Past Surgical History:  Procedure Laterality Date  . ANAL FISTULOTOMY  04/03/2016   Procedure: ANAL FISTULOTOMY;  Surgeon: Kieth Brightly, MD;  Location: [facility] ORS;  Service: General;;  . BLADDER SURGERY    . COLONOSCOPY  07/06/2015   Dr Shelle Iron  . KNEE ARTHROSCOPY Left   . LEFT HEART CATH AND CORONARY ANGIOGRAPHY N/A 12/19/2018   Procedure: LEFT HEART CATH AND CORONARY ANGIOGRAPHY;  Surgeon: Antonieta Iba, MD;  Location: [facility] INVASIVE CV LAB;  Service: Cardiovascular;  Laterality: N/A;  . UPPER GASTROINTESTINAL ENDOSCOPY  07/06/2015    Prior to Admission medications   Medication Sig Start Date End Date Taking? Authorizing Provider  aspirin 81 MG chewable tablet Chew 1 tablet (81 mg total) by mouth daily. 12/21/18    Altamese Dilling, MD  carvedilol (COREG) 12.5 MG tablet Take 0.5 tablets (6.25 mg total) by mouth 2 (two) times daily. 04/27/19   Antonieta Iba, MD  colchicine 0.6 MG tablet Take 1 tablet (0.6 mg total) by mouth 2 (two) times daily as needed. 04/27/19   Antonieta Iba, MD  ENTRESTO 24-26 MG TAKE 1 TABLET BY MOUTH TWICE A DAY 05/11/19   Antonieta Iba, MD  furosemide (LASIX) 40 MG tablet Take 1 tablet (40 mg total) by mouth daily. 01/09/19   Antonieta Iba, MD  ibuprofen (ADVIL) 600 MG tablet Take 1 tablet (600 mg total) by mouth every 8 (eight) hours as needed. 07/20/19   Joni Reining, PA-C  Omega-3 Fatty Acids (FISH OIL) 1000 MG CPDR Take 1,000 mg by mouth daily.     [provider]  spironolactone (ALDACTONE) 25 MG tablet Take 0.5 tablets (12.5 mg total) by mouth daily. 01/09/19   Antonieta Iba, MD  traMADol (ULTRAM) 50 MG tablet Take 1 tablet (50 mg total) by mouth every 6 (six) hours as needed. 07/20/19 07/19/20  Joni Reining, PA-C  vitamin B-12 (CYANOCOBALAMIN) 500 MCG tablet Take 500 mcg by mouth daily.    [provider]  Zinc Sulfate (ZINC-220 PO) Take 220 mg by mouth daily.     [provider]    Allergies Codeine  Family History  Problem Relation Age of Onset  .  Colon cancer Maternal Grandmother   . Prostate cancer Paternal Uncle   . Leukemia Paternal Uncle   . Heart failure Father   . CAD Sister     Social History Social History   Tobacco Use  . Smoking status: Never Smoker  . Smokeless tobacco: Never Used  Substance Use Topics  . Alcohol use: Yes    Comment: moderate use- does not like to quantify  . Drug use: Yes    Types: Marijuana, Cocaine    Review of Systems Constitutional: No fever/chills Eyes: No visual changes. ENT: No sore throat. Cardiovascular: Denies chest pain. Respiratory: Denies shortness of breath. Gastrointestinal: No abdominal pain.  No nausea, no vomiting.  No diarrhea.  No constipation.  Genitourinary: Negative for dysuria. Musculoskeletal: Left wrist pain. Skin: Negative for rash. Neurological: Negative for headaches, focal weakness or numbness. Psychiatric:  Anxiety.  Alcohol substance abuse. Allergic/Immunilogical: Codeine ____________________________________________   PHYSICAL EXAM:  VITAL SIGNS: ED Triage Vitals  Enc Vitals Group     BP 07/20/19 0731 (!) 150/84     Pulse Rate 07/20/19 0731 81     Resp 07/20/19 0731 18     Temp 07/20/19 0731 98.3 F (36.8 C)     Temp Source 07/20/19 0731 Oral     SpO2 07/20/19 0731 98 %     Weight 07/20/19 0732 190 lb (86.2 kg)     Height 07/20/19 0732 6\' 3"  (1.905 m)     Head Circumference --      Peak Flow --      Pain Score 07/20/19 0731 10     Pain Loc --      Pain Edu? --      Excl. in [place]? --     Constitutional: Alert and oriented. Well appearing and in no acute distress. Neck: No cervical spine tenderness to palpation. Hematological/Lymphatic/Immunilogical: No cervical lymphadenopathy. Cardiovascular: Normal rate, regular rhythm. Grossly normal heart sounds.  Good peripheral circulation.  Evaded blood pressure Respiratory: Normal respiratory effort.  No retractions. Lungs CTAB. Musculoskeletal: No obvious deformity to the left wrist.  Patient has moderate guarding palpation of distal ulna and radius.  Decreased range of motion by complaint of pain.   Neurologic:  Normal speech and language. No gross focal neurologic deficits are appreciated. No gait instability. Skin:  Skin is warm, dry and intact. No rash noted. Psychiatric: Mood and affect are normal. Speech and behavior are normal.  ____________________________________________   LABS (all labs ordered are listed, but only abnormal results are displayed)  Labs Reviewed - No data to display ____________________________________________  EKG   ____________________________________________  RADIOLOGY  ED MD interpretation:   Official radiology  report(s): DG Wrist Complete Left  Result Date: 07/20/2019 CLINICAL DATA:  Pain following fall EXAM: LEFT WRIST - COMPLETE 3+ VIEW COMPARISON:  None. FINDINGS: Frontal, oblique, lateral, and ulnar deviation scaphoid images were obtained. No demonstrable fracture or dislocation. There is moderate osteoarthritic change in the first carpal-metacarpal joint. There is slight osteoarthritic change in the scaphotrapezial joint. Other joint spaces appear unremarkable. No erosive changes. IMPRESSION: No fracture or dislocation. 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 Electronically Signed   By: Lowella Grip III M.D.   On: 07/20/2019 07:59    ____________________________________________   PROCEDURES  Procedure(s) performed (including Critical Care):  Procedures   ____________________________________________   INITIAL IMPRESSION / ASSESSMENT AND PLAN / ED COURSE  As part of my medical decision making, I  reviewed the following data within the electronic MEDICAL RECORD NUMBER     Left wrist pain secondary to sprain.  Discussed x-ray findings with patient.  Patient placed in a splint and given discharge care instructions.  Patient advised follow-up PCP.    Edward [name] was evaluated in Emergency Department on 07/20/2019 for the symptoms described in the history of present illness. He was evaluated in the context of the global COVID-19 pandemic, which necessitated consideration that the patient might be at risk for infection with the SARS-CoV-2 virus that causes COVID-19. Institutional protocols and algorithms that pertain to the evaluation of patients at risk for COVID-19 are in a state of rapid change based on information released by regulatory bodies including the CDC and federal and state organizations. These policies and algorithms were followed during the patient's care in the ED.        ____________________________________________   FINAL CLINICAL IMPRESSION(S) / ED DIAGNOSES  Final diagnoses:  Sprain of left wrist, initial encounter     ED Discharge Orders         Ordered    traMADol (ULTRAM) 50 MG tablet  Every 6 hours PRN     07/20/19 0815    ibuprofen (ADVIL) 600 MG tablet  Every 8 hours PRN     07/20/19 0815           Note:  This document was prepared using Dragon voice recognition software and may include unintentional dictation errors.    Betty, Brooks, PA-C 07/20/19 1245    Jene Every, MD 07/20/19 1254

## 2019-07-20 NOTE — ED Triage Notes (Signed)
Pt reports he went out Saturday night with flip flops on and slipped on wet porch, c/o pain to left wrist. Denies head injury.

## 2019-07-20 NOTE — Discharge Instructions (Signed)
Wear wrist splint For 3 to 5 days as needed.  Follow discharge care instruction take medication as directed.

## 2019-07-20 NOTE — ED Notes (Signed)
See triage note  Presents s/p fall on Saturday  States he fell back  Landed on left arm  Swelling and pain noted to left wrist  Good pulses

## 2019-07-28 ENCOUNTER — Emergency Department
Admission: EM | Admit: 2019-07-28 | Discharge: 2019-07-28 | Disposition: A | Discharge status: Home / Self Care | Payer: No Typology Code available for payment source | Attending: Emergency Medicine | Admitting: Emergency Medicine

## 2019-07-28 ENCOUNTER — Encounter: Payer: Self-pay | Admitting: Emergency Medicine

## 2019-07-28 ENCOUNTER — Other Ambulatory Visit: Payer: Self-pay

## 2019-07-28 DIAGNOSIS — M79604 Pain in right leg: Secondary | ICD-10-CM | POA: Diagnosis present

## 2019-07-28 DIAGNOSIS — Z79899 Other long term (current) drug therapy: Secondary | ICD-10-CM | POA: Diagnosis not present

## 2019-07-28 DIAGNOSIS — M10071 Idiopathic gout, right ankle and foot: Secondary | ICD-10-CM

## 2019-07-28 DIAGNOSIS — Z7982 Long term (current) use of aspirin: Secondary | ICD-10-CM | POA: Diagnosis not present

## 2019-07-28 DIAGNOSIS — M10061 Idiopathic gout, right knee: Secondary | ICD-10-CM | POA: Insufficient documentation

## 2019-07-28 DIAGNOSIS — I252 Old myocardial infarction: Secondary | ICD-10-CM | POA: Insufficient documentation

## 2019-07-28 MED ORDER — OXYCODONE-ACETAMINOPHEN 5-325 MG PO TABS
1.0000 | ORAL_TABLET | Freq: Once | ORAL | Status: AC
  Administered 2019-07-28: 12:00:00 1 via ORAL
  Filled 2019-07-28: qty 1

## 2019-07-28 MED ORDER — COLCHICINE 0.6 MG PO TABS: ORAL_TABLET | ORAL | 0 refills | Status: DC

## 2019-07-28 MED ORDER — OXYCODONE-ACETAMINOPHEN 5-325 MG PO TABS: 1.0000 | ORAL_TABLET | Freq: Three times a day (TID) | ORAL | 0 refills | Status: AC | PRN

## 2019-07-28 MED ORDER — COLCHICINE 0.6 MG PO TABS
1.2000 mg | ORAL_TABLET | Freq: Once | ORAL | Status: AC
  Administered 2019-07-28: 12:00:00 1.2 mg via ORAL
  Filled 2019-07-28: qty 2

## 2019-07-28 NOTE — ED Triage Notes (Signed)
Pt sates has gout and yesterday started with a flare up to right foot and right knee.

## 2019-07-28 NOTE — ED Notes (Signed)
See triage note  Presents with possible gout flare to right knee and toe  Unable to bear full wt  Skin warm to touch

## 2019-07-28 NOTE — ED Provider Notes (Signed)
[facility] Emergency Department Provider Note ____________________________________________  Time seen: 1119  I have reviewed the triage vital signs and the nursing notes.  HISTORY  Chief Complaint  Gout  HPI Edward [name] is a 53 y.o. male presents to the ED for evaluation of acute right knee and right ankle pain.  Patient with a history of gout, reports sudden flare to the right knee and foot.  He has had gout flares in the foot in the past, but the knee is a new occurrence.  He presents with swelling and fluid around the knee as well as pain with movement.  He also presents with a warm, tender, swollen right foot.  Patient was seen recently for mechanical fall resulting in a left wrist fracture.  He denies any other injury at this time, denies any fevers, chills, chest pain, or shortness of breath.  He takes daily colchicine for gout maintenance.  He denies any recent gout triggers.   Past Medical History:  Diagnosis Date  . Alcohol abuse   . Colon polyp 2016  . Herpes   . MI (myocardial infarction) ([place])   . Polysubstance abuse ([place])   . Vitamin B 12 deficiency     Patient Active Problem List   Diagnosis Date Noted  . Dilated cardiomyopathy ([place]) 01/08/2019  . Polysubstance abuse ([place]) 01/08/2019  . Anxiety 01/08/2019  . Chest pain 12/17/2018  . Leg pain 08/07/2018  . Neuropathy 06/23/2018  . Varicose veins of both lower extremities 06/23/2018  . Bilateral cold feet 06/23/2018    Past Surgical History:  Procedure Laterality Date  . ANAL FISTULOTOMY  04/03/2016   Procedure: ANAL FISTULOTOMY;  Surgeon: Christene Lye, MD;  Location: [facility] ORS;  Service: General;;  . BLADDER SURGERY    . COLONOSCOPY  07/06/2015   Dr Rayann Heman  . KNEE ARTHROSCOPY Left   . LEFT HEART CATH AND CORONARY ANGIOGRAPHY N/A 12/19/2018   Procedure: LEFT HEART CATH AND CORONARY ANGIOGRAPHY;  Surgeon: Minna Merritts, MD;  Location: [place] CV LAB;  Service:  Cardiovascular;  Laterality: N/A;  . UPPER GASTROINTESTINAL ENDOSCOPY  07/06/2015    Prior to Admission medications   Medication Sig Start Date End Date Taking? Authorizing Provider  aspirin 81 MG chewable tablet Chew 1 tablet (81 mg total) by mouth daily. 12/21/18   Vaughan Basta, MD  carvedilol (COREG) 12.5 MG tablet Take 0.5 tablets (6.25 mg total) by mouth 2 (two) times daily. 04/27/19   Minna Merritts, MD  colchicine 0.6 MG tablet Take 1 tablet (0.6 mg total) by mouth 2 (two) times daily as needed. 04/27/19   Minna Merritts, MD  colchicine 0.6 MG tablet Take 2 tabs PO x 1 with initial flare, then 1 tab PO 1 hour later x 1. Then restart 1 BID tomorrow 07/28/19   Chana Lindstrom, Dannielle Karvonen, PA-C  ENTRESTO 24-26 MG TAKE 1 TABLET BY MOUTH TWICE A DAY 05/11/19   Minna Merritts, MD  furosemide (LASIX) 40 MG tablet Take 1 tablet (40 mg total) by mouth daily. 01/09/19   Minna Merritts, MD  ibuprofen (ADVIL) 600 MG tablet Take 1 tablet (600 mg total) by mouth every 8 (eight) hours as needed. 07/20/19   Sable Feil, PA-C  Omega-3 Fatty Acids (FISH OIL) 1000 MG CPDR Take 1,000 mg by mouth daily.     [provider]  oxyCODONE-acetaminophen (PERCOCET) 5-325 MG tablet Take 1 tablet by mouth 3 (three) times daily as needed for up  to 5 days for severe pain. 07/28/19 08/02/19  Kamirah Shugrue, Charlesetta Ivory, PA-C  spironolactone (ALDACTONE) 25 MG tablet Take 0.5 tablets (12.5 mg total) by mouth daily. 01/09/19   Antonieta Iba, MD  traMADol (ULTRAM) 50 MG tablet Take 1 tablet (50 mg total) by mouth every 6 (six) hours as needed. 07/20/19 07/19/20  Joni Reining, PA-C  vitamin B-12 (CYANOCOBALAMIN) 500 MCG tablet Take 500 mcg by mouth daily.    [provider]  Zinc Sulfate (ZINC-220 PO) Take 220 mg by mouth daily.     [provider]    Allergies Codeine  Family History  Problem Relation Age of Onset  . Colon cancer Maternal Grandmother   . Prostate cancer  Paternal Uncle   . Leukemia Paternal Uncle   . Heart failure Father   . CAD Sister     Social History Social History   Tobacco Use  . Smoking status: Never Smoker  . Smokeless tobacco: Never Used  Substance Use Topics  . Alcohol use: Yes    Comment: moderate use- does not like to quantify  . Drug use: Yes    Types: Marijuana, Cocaine    Review of Systems  Constitutional: Negative for fever. Cardiovascular: Negative for chest pain. Respiratory: Negative for shortness of breath. Gastrointestinal: Negative for abdominal pain, vomiting and diarrhea. Genitourinary: Negative for dysuria. Musculoskeletal: Negative for back pain. Right knee & right foot pain Skin: Negative for rash. Neurological: Negative for headaches, focal weakness or numbness. ____________________________________________  PHYSICAL EXAM:  VITAL SIGNS: ED Triage Vitals  Enc Vitals Group     BP 07/28/19 1102 (!) 150/88     Pulse Rate 07/28/19 1102 88     Resp 07/28/19 1102 18     Temp 07/28/19 1102 98 F (36.7 C)     Temp Source 07/28/19 1102 Oral     SpO2 07/28/19 1102 98 %     Weight 07/28/19 1041 190 lb (86.2 kg)     Height 07/28/19 1041 6\' 3"  (1.905 m)     Head Circumference --      Peak Flow --      Pain Score 07/28/19 1041 10     Pain Loc --      Pain Edu? --      Excl. in GC? --     Constitutional: Alert and oriented. Well appearing and in no distress. Head: Normocephalic and atraumatic. Eyes: Conjunctivae are normal. Normal extraocular movements Cardiovascular: Normal rate, regular rhythm. Normal distal pulses. Respiratory: Normal respiratory effort. No wheezes/rales/rhonchi. Gastrointestinal: Soft and nontender. No distention. Musculoskeletal: right knee with a moderate effusion noted. Decreased ROM due to pain. Knee joint is tender to palp and warm ot touch. No erythema noted. Right ankle/foot with moderate edema, warmth, and tenderness. Mild erythema without concern for cellulitis noted.  Nontender with normal range of motion in all other extremities.  Neurologic:  Normal gross sensation. Normal speech and language. No gross focal neurologic deficits are appreciated. Skin:  Skin is warm, dry and intact. No rash noted. Psychiatric: Mood and affect are normal. Patient exhibits appropriate insight and judgment. ____________________________________________  PROCEDURES  Percocet 5-325 mg PO Colchicine 1.2 mg PO Procedures ____________________________________________  INITIAL IMPRESSION / ASSESSMENT AND PLAN / ED COURSE  Patient presents to the ED with acute right knee and right foot pain and swelling secondary to gout flare.  Patient with a history of gout takes colchicine daily.  He describes an acute flare over the last 2 to 3  days.  He denies any preceding accident, trauma to the ankle or foot.  He presents with pain and disability and effusion to the knee.  He is treated with acute pain medicines and a loading dose of colchicine at this time.  We discussed the possibility of a joint aspiration to the knee, but the patient declined at this time.  He will be referred to orthopedics for ongoing symptom management.  Return precautions have been reviewed.  Khristian Boland Siegel was evaluated in Emergency Department on 07/28/2019 for the symptoms described in the history of present illness. He was evaluated in the context of the global COVID-19 pandemic, which necessitated consideration that the patient might be at risk for infection with the SARS-CoV-2 virus that causes COVID-19. Institutional protocols and algorithms that pertain to the evaluation of patients at risk for COVID-19 are in a state of rapid change based on information released by regulatory bodies including the CDC and federal and state organizations. These policies and algorithms were followed during the patient's care in the ED.  I reviewed the patient's prescription history over the last 12 months in the multi-state controlled  substances database(s) that includes [place], [place], [place], [place], [place], [place], [place], [place], [place], [place], [place], [place], [place], and [place].  Results were notable for no current RX.  ____________________________________________  FINAL CLINICAL IMPRESSION(S) / ED DIAGNOSES  Final diagnoses:  Acute idiopathic gout of right foot  Acute idiopathic gout of right knee      Karmen Stabs, Charlesetta Ivory, PA-C 07/28/19 1712    Jene Every, MD 07/29/19 [phone]

## 2019-07-28 NOTE — Discharge Instructions (Signed)
Your exam is consistent with a gout flare to the foot and knee. Take the colchicine as directed and the pain medicine as needed. Follow-up with Dr. Roland Rack (Ortho) for ongoing knee fluid collection. Rest with the leg elevated when seated.

## 2019-08-13 ENCOUNTER — Other Ambulatory Visit: Payer: Self-pay | Admitting: Cardiovascular Disease

## 2019-09-13 ENCOUNTER — Other Ambulatory Visit: Payer: Self-pay | Admitting: Cardiovascular Disease

## 2019-09-14 NOTE — Telephone Encounter (Signed)
Patient needs an appointment for further refills. If patient does not want to schedule an appointment please make them aware to contact PCP for refills.

## 2019-09-14 NOTE — Telephone Encounter (Signed)
Unable to lvm due to patients vm being full Needs to schedule for refill request 

## 2019-09-22 NOTE — Telephone Encounter (Signed)
Unable to lvm due to patients vm being full Needs to schedule for refill request

## 2019-10-01 ENCOUNTER — Encounter: Payer: Self-pay | Admitting: Cardiovascular Disease

## 2019-10-01 NOTE — Telephone Encounter (Signed)
Attempted to schedule no ans no vm  3 attempts to schedule unable to contact  Mailed letter and closing encounter

## 2019-10-18 NOTE — Progress Notes (Signed)
Office Visit    Patient Name: Edward [name] Date of Encounter: 10/23/2019  Primary Care Provider:  Laneta Simmers, NP Primary Cardiologist:  Ida Rogue, MD  Chief Complaint    Chief Complaint  Patient presents with  . Follow-up    Pt. c/o legs ache, numbness & stinging in toes and feet, dizziness with position changes and shortness of breath with occas. chest pain. Meds reviewed by the pt. verbally.    54 year old male with history of HFrEF 2/2 NICM, polysubstance use with alcohol / cocaine / marijuana use, anxiety/panic attacks, peripheral neuropathy with prior normal noninvasive vascular workup, and who presents for follow-up.  Past Medical History    Past Medical History:  Diagnosis Date  . Alcohol abuse   . Colon polyp 2016  . Herpes   . MI (myocardial infarction) ([place])   . Polysubstance abuse ([place])   . Vitamin B 12 deficiency    Past Surgical History:  Procedure Laterality Date  . ANAL FISTULOTOMY  04/03/2016   Procedure: ANAL FISTULOTOMY;  Surgeon: Christene Lye, MD;  Location: [facility] ORS;  Service: General;;  . BLADDER SURGERY    . COLONOSCOPY  07/06/2015   Dr Rayann Heman  . KNEE ARTHROSCOPY Left   . LEFT HEART CATH AND CORONARY ANGIOGRAPHY N/A 12/19/2018   Procedure: LEFT HEART CATH AND CORONARY ANGIOGRAPHY;  Surgeon: Minna Merritts, MD;  Location: [place] CV LAB;  Service: Cardiovascular;  Laterality: N/A;  . UPPER GASTROINTESTINAL ENDOSCOPY  07/06/2015    Allergies  Allergies  Allergen Reactions  . Codeine Nausea And Vomiting    History of Present Illness    Edward [name] is a 54 y.o. male with PMH as above. He has a history of alcohol, cocaine, and marijuana use.  He also has a history of panic attacks that are exacerbated by polysubstance use.  He was seen in the hospital in May 2020 for shortness of breath and elevated troponin of 0.04. CTA showed mild aortic calcification.  Echo showed EF 20-25%.  May 2020 cardiac catheterization  showed no significant CAD with elevated LVEDP at 33mmHg. Medical management was recommended. He was seen at follow-up and reported that he had stopped cocaine and was moderating his alcohol use.  Echo showed EF 40-45% with global hypokinesis and impaired diastolic dysfunction. He was last seen by his primary cardiologist. Recommendations at that time were to start Coreg 6.25 mg twice daily for 1 week then increase up to 12.5 mg twice daily if tolerated.  He was to continue Entresto, spironolactone, and diuretic.  Beta-blocker had been held due to cocaine use.  He was started on colchicine twice daily as needed with NSAIDs for his gout.  Follow-up was recommended per primary care.  Complete cessation from alcohol and cocaine was also recommended with follow-up in 3 months.  In October 2020, he called the office and reported that he had passed out with BP 68/40.  He did not go to the emergency room.  He stated he felt better and thought that it was related to his blood sugar.  He was not seen in the office.  Today, he returns to clinic and reports that he has not been feeling well.  He reports feeling dizzy.  He states that he has not been drinking very much water.  He continues to smoke marijuana. He also continues to drink alcohol, though he is attempting to cut back.  He reports drinking between 4-5 beers per day or "3-4 boot leggers  max" in a day.  He reports labile blood pressures but does not have a log, and he estimates max SBP 150s and max DBP 90s. Usually, his BP runs 120s/70s. He does not pay much attention to salt in his diet but states he does not add any.  Recently, reports several episodes of diarrhea last week.  These have reportedly now resolved, though they were reportedly quite constant for some time with continued low intake of water.  Orthostatics performed today and positive with patient becoming dizzy when transitioning from lying to sitting, as well as sitting to standing.  He also reports  feeling hot towards the end of standing. Midway through our visit, however, he reports feeling less dizzy and much improved.  He reports intermittent chest pain that, on further questioning, is clarified as sensitivity of the skin around both nipples due to chafing from his shirt.  He states that he has not passed out since his last reported case in October, during which time he called the office.  He denies any mechanical falls or LOC.  He denies any orthopnea, PND, abdominal distention, lower extremity edema, or early satiety.  He does note continued symptoms of gout.  Recently, he reports that he has had a lot of problems with pain in his feet, which he knows might in part be due to his gout.  He denies signs and symptoms consistent with bleeding.  He reports medication compliance.  Home Medications    Prior to Admission medications   Medication Sig Start Date End Date Taking? Authorizing Provider  aspirin 81 MG chewable tablet Chew 1 tablet (81 mg total) by mouth daily. 12/21/18   Altamese Dilling, MD  carvedilol (COREG) 12.5 MG tablet Take 0.5 tablets (6.25 mg total) by mouth 2 (two) times daily. 04/27/19   Antonieta Iba, MD  colchicine 0.6 MG tablet Take 1 tablet (0.6 mg total) by mouth 2 (two) times daily as needed. 04/27/19   Antonieta Iba, MD  colchicine 0.6 MG tablet Take 2 tabs PO x 1 with initial flare, then 1 tab PO 1 hour later x 1. Then restart 1 BID tomorrow 07/28/19   Menshew, Charlesetta Ivory, PA-C  ENTRESTO 24-26 MG TAKE 1 TABLET BY MOUTH 2 TIMES DAILY. PLEASE CALL TO SCHEDULE OFFICE VISIT FOR REFILLS. 10/02/19   Antonieta Iba, MD  furosemide (LASIX) 40 MG tablet Take 1 tablet (40 mg total) by mouth daily. 01/09/19   Antonieta Iba, MD  ibuprofen (ADVIL) 600 MG tablet Take 1 tablet (600 mg total) by mouth every 8 (eight) hours as needed. 07/20/19   Joni Reining, PA-C  Omega-3 Fatty Acids (FISH OIL) 1000 MG CPDR Take 1,000 mg by mouth daily.     [provider]   spironolactone (ALDACTONE) 25 MG tablet Take 0.5 tablets (12.5 mg total) by mouth daily. 01/09/19   Antonieta Iba, MD  traMADol (ULTRAM) 50 MG tablet Take 1 tablet (50 mg total) by mouth every 6 (six) hours as needed. 07/20/19 07/19/20  Joni Reining, PA-C  vitamin B-12 (CYANOCOBALAMIN) 500 MCG tablet Take 500 mcg by mouth daily.    [provider]  Zinc Sulfate (ZINC-220 PO) Take 220 mg by mouth daily.     [provider]    Review of Systems    He denies chest pain, palpitations, dyspnea, pnd, orthopnea, n, v, recent syncope, edema, weight gain, or early satiety.  He reports bilateral chafing of nipples from his shirt and  intermittent dizziness without LOC.  He reports recent diarrhea that have since resolved.  He reports bilateral lower extremity paresthesias and pain in his feet.  All other systems reviewed and are otherwise negative except as noted above.  Physical Exam    VS:  BP 90/60 (BP Location: Left Arm, Patient Position: Sitting, Cuff Size: Normal)   Pulse 73   Ht 6\' 3"  (1.905 m)   Wt 202 lb 4 oz (91.7 kg)   SpO2 98%   BMI 25.28 kg/m  , BMI Body mass index is 25.28 kg/m. GEN: Well nourished, well developed, in no acute distress. HEENT: normal. Neck: Supple, no JVD, carotid bruits, or masses. Cardiac: RRR, no murmurs, rubs, or gallops. No clubbing, cyanosis, edema.  Radials 2+ and equal bilaterally. 1+ b/l dorsalis pedis and posterior tibial pulses.  Respiratory:  Respirations regular and unlabored, clear to auscultation bilaterally. GI: Soft, nontender, nondistended, BS + x 4. MS: no deformity or atrophy. Skin: warm and dry, no rash. Neuro:  Strength and sensation are intact. Psych: Normal affect.  Accessory Clinical Findings    ECG personally reviewed by me today -NSR, 73 bpm, nonspecific ST and T wave abnormality, LVH with repolarization abnormality- no acute changes.  VITALS Reviewed today   Temp Readings from Last 3 Encounters:  07/28/19  98 F (36.7 C) (Oral)  07/20/19 98.3 F (36.8 C) (Oral)  02/05/19 98 F (36.7 C) (Oral)   BP Readings from Last 3 Encounters:  10/23/19 90/60  07/28/19 120/80  07/20/19 (!) 150/84   Pulse Readings from Last 3 Encounters:  10/23/19 73  07/28/19 80  07/20/19 81    Wt Readings from Last 3 Encounters:  10/23/19 202 lb 4 oz (91.7 kg)  07/28/19 190 lb (86.2 kg)  07/20/19 190 lb (86.2 kg)     LABS  reviewed today   CareEverwhere Labs present? Yes/No: No  Lab Results  Component Value Date   WBC 10.3 02/05/2019   HGB 13.7 02/05/2019   HCT 39.8 02/05/2019   MCV 95.4 02/05/2019   PLT 240 02/05/2019   Lab Results  Component Value Date   CREATININE 1.16 04/27/2019   BUN 13 04/27/2019   NA 135 04/27/2019   K 4.8 04/27/2019   CL 97 04/27/2019   CO2 19 (L) 04/27/2019   No results found for: ALT, AST, GGT, ALKPHOS, BILITOT Lab Results  Component Value Date   CHOL 174 12/17/2018   HDL 45 12/17/2018   LDLCALC UNABLE TO CALCULATE IF TRIGLYCERIDE OVER 400 mg/dL 12/19/2018   LDLDIRECT 72.0 12/17/2018   TRIG 457 (H) 12/17/2018   CHOLHDL 3.9 12/17/2018    Lab Results  Component Value Date   HGBA1C 5.6 12/17/2018   No results found for: TSH   STUDIES/PROCEDURES reviewed today   Echo 04/24/2019 1. Left ventricular ejection fraction, by visual estimation, is 40 to  45%. The left ventricle has mild to moderately decreased function. Normal  left ventricular size. There is mildly increased left ventricular  hypertrophy.Global hypokinesis.  2. Left ventricular diastolic Doppler parameters are consistent with  impaired relaxation pattern of LV diastolic filling.  3. Global right ventricle has normal systolic function.The right  ventricular size is normal. No increase in right ventricular wall  thickness.  4. TR signal is inadequate for assessing pulmonary artery systolic  pressure.  5. Left atrial size was normal.   LHC 12/19/2018  LV end diastolic pressure is  moderately elevated.  There is severe left ventricular systolic dysfunction.  The left ventricular  ejection fraction is less than 25% by visual estimate.  Orthostatic Vital Signs:    HR  BP  Lying:  73  109/72 Sitting:  84  108/73 Standing:  86  87/61 2 minutes: 93  78/48   Assessment & Plan    Orthostatic hypotension /dizziness --Describes symptoms of dizziness that occur with position changes and sometimes without clear triggers or exacerbating/alleviating factors.  He does have a history of positional dizziness, which is considered as well. He reports his BP typically runs in the 90s to 110 systolic without sx. He reports that recently his BP has been labile. He has had poor oral intake and recent bouts with diarrhea, which we discussed could be contributing. Hydration encouraged. Explained that alcohol can lead to GI issues and electrolyte imbalances.  We will check a BMET today to ensure electrolytes stable following his diarrhea. Check a CBC but no reported s/sx of dizziness.  HFrEF secondary to NICM --Euvolemic on exam, despite the fact that his weight is up 12 pounds from December 2020.  Denies shortness of breath or dyspnea on exertion.  Updated echo as above shows improvement of LVEF to 40 to 45%.  Cath as above without obstructive dz. As previously noted, it is thought that HFrEF is likely in the setting of longstanding polysubstance abuse with alcohol and cocaine. He is not using cocaine. If he continues to experience dizziness, recommend decrease his BB at RTC. He prefers to defer this decrease for now and try hydration. 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 He was instructed not to drive or operate heavy machinery as well if he continues to feel dizzy. He understands and will let us know if he continues to feel dizzy. We will check a BMET. For now, continue current ASA, BB,  spironolactone, lasix, and Entresto. Current vitals and presyncope preclude escalation of GDMT.  Lower extremity paresthesias, decreased pedal pulses --Given continued c/o lower extremity paresthesias and pain, will obtain lower extremity ABIs with reduced pedal pulses on exam. On review of past notes, it appears that this was to be ordered in the past but not performed.   Polysubstance use --Denies recent or current cocaine use.  Cautioned against using cocaine on a beta-blocker.  He reports continued alcohol use, though he is continuing to try to cut back.  Discussed that continued alcohol use can cause labile pressures and GI symptoms with electrolyte abnormalities with patient understanding.  We will continue to try to cut back.  Also cautioned against continued marijuana use.  He expressed his understanding.  Medication changes: None. Labs ordered: BMET, CBC Studies / Imaging ordered: ABIs Future considerations: Carotid, Zio Disposition: RTC after studies  Total time spent with patient today 45 minutes. This includes reviewing records, evaluating the patient, and coordinating care. Face-to-face time >50%.    Lennon Alstrom, PA-C 10/23/2019

## 2019-10-23 ENCOUNTER — Encounter: Payer: Self-pay | Admitting: Physician Assistant

## 2019-10-23 ENCOUNTER — Ambulatory Visit (INDEPENDENT_AMBULATORY_CARE_PROVIDER_SITE_OTHER): Payer: No Typology Code available for payment source | Admitting: Physician Assistant

## 2019-10-23 ENCOUNTER — Other Ambulatory Visit: Payer: Self-pay

## 2019-10-23 VITALS — BP 90/60 | HR 73 | Ht 75.0 in | Wt 202.2 lb

## 2019-10-23 DIAGNOSIS — R197 Diarrhea, unspecified: Secondary | ICD-10-CM | POA: Diagnosis not present

## 2019-10-23 DIAGNOSIS — M109 Gout, unspecified: Secondary | ICD-10-CM

## 2019-10-23 DIAGNOSIS — R42 Dizziness and giddiness: Secondary | ICD-10-CM | POA: Diagnosis not present

## 2019-10-23 DIAGNOSIS — R200 Anesthesia of skin: Secondary | ICD-10-CM

## 2019-10-23 DIAGNOSIS — R0789 Other chest pain: Secondary | ICD-10-CM

## 2019-10-23 DIAGNOSIS — F1910 Other psychoactive substance abuse, uncomplicated: Secondary | ICD-10-CM

## 2019-10-23 DIAGNOSIS — R208 Other disturbances of skin sensation: Secondary | ICD-10-CM

## 2019-10-23 DIAGNOSIS — I5022 Chronic systolic (congestive) heart failure: Secondary | ICD-10-CM

## 2019-10-23 DIAGNOSIS — R0989 Other specified symptoms and signs involving the circulatory and respiratory systems: Secondary | ICD-10-CM

## 2019-10-23 DIAGNOSIS — I420 Dilated cardiomyopathy: Secondary | ICD-10-CM

## 2019-10-23 DIAGNOSIS — I428 Other cardiomyopathies: Secondary | ICD-10-CM

## 2019-10-23 DIAGNOSIS — R202 Paresthesia of skin: Secondary | ICD-10-CM

## 2019-10-23 NOTE — Patient Instructions (Signed)
Medication Instructions:  Your physician recommends that you continue on your current medications as directed. Please refer to the Current Medication list given to you today.  *If you need a refill on your cardiac medications before your next appointment, please call your pharmacy*   Lab Work: Bmet and Cbc today If you have labs (blood work) drawn today and your tests are completely normal, you will receive your results only by: Marland Kitchen MyChart Message (if you have MyChart) OR . A paper copy in the mail If you have any lab test that is abnormal or we need to change your treatment, we will call you to review the results.   Testing/Procedures: Your physician has requested that you have an ankle brachial index (ABI). During this test an ultrasound and blood pressure cuff are used to evaluate the arteries that supply the arms and legs with blood. Allow thirty minutes for this exam. There are no restrictions or special instructions.     Follow-Up: At [facility], you and your health needs are our priority.  As part of our continuing mission to provide you with exceptional heart care, we have created designated Provider Care Teams.  These Care Teams include your primary Cardiologist (physician) and Advanced Practice Providers (APPs -  Physician Assistants and Nurse Practitioners) who all work together to provide you with the care you need, when you need it.  We recommend signing up for the patient portal called "MyChart".  Sign up information is provided on this After Visit Summary.  MyChart is used to connect with patients for Virtual Visits (Telemedicine).  Patients are able to view lab/test results, encounter notes, upcoming appointments, etc.  Non-urgent messages can be sent to your provider as well.   To learn more about what you can do with MyChart, go to [email or web address].    Your next appointment:   4 week(s)  The format for your next appointment:   In Person  Provider:    You  may see Julien Nordmann, MD or one of the following Advanced Practice Providers on your designated Care Team:    Nicolasa Ducking, NP  Eula Listen, PA-C  Marisue Ivan, PA-C    Other Instructions N/A

## 2019-10-24 LAB — BASIC METABOLIC PANEL
BUN/Creatinine Ratio: 4 — ABNORMAL LOW (ref 9–20)
BUN: 6 mg/dL (ref 6–24)
CO2: 21 mmol/L (ref 20–29)
Calcium: 9.7 mg/dL (ref 8.7–10.2)
Chloride: 100 mmol/L (ref 96–106)
Creatinine, Ser: 1.34 mg/dL — ABNORMAL HIGH (ref 0.76–1.27)
GFR calc Af Amer: 68 mL/min/{1.73_m2} (ref >59)
GFR calc non Af Amer: 59 mL/min/{1.73_m2} — ABNORMAL LOW (ref >59)
Glucose: 99 mg/dL (ref 65–99)
Potassium: 4.2 mmol/L (ref 3.5–5.2)
Sodium: 138 mmol/L (ref 134–144)

## 2019-10-24 LAB — CBC WITH DIFFERENTIAL/PLATELET
Basophils Absolute: 0 10*3/uL (ref 0.0–0.2)
Basos: 1 %
EOS (ABSOLUTE): 0.1 10*3/uL (ref 0.0–0.4)
Eos: 2 %
Hematocrit: 46.5 % (ref 37.5–51.0)
Hemoglobin: 16.2 g/dL (ref 13.0–17.7)
Immature Grans (Abs): 0 10*3/uL (ref 0.0–0.1)
Immature Granulocytes: 0 %
Lymphocytes Absolute: 2.1 10*3/uL (ref 0.7–3.1)
Lymphs: 31 %
MCH: 32.2 pg (ref 26.6–33.0)
MCHC: 34.8 g/dL (ref 31.5–35.7)
MCV: 92 fL (ref 79–97)
Monocytes Absolute: 0.7 10*3/uL (ref 0.1–0.9)
Monocytes: 11 %
Neutrophils Absolute: 3.8 10*3/uL (ref 1.4–7.0)
Neutrophils: 55 %
Platelets: 308 10*3/uL (ref 150–450)
RBC: 5.03 x10E6/uL (ref 4.14–5.80)
RDW: 15.2 % (ref 11.6–15.4)
WBC: 6.9 10*3/uL (ref 3.4–10.8)

## 2019-10-25 ENCOUNTER — Other Ambulatory Visit: Payer: Self-pay | Admitting: Cardiovascular Disease

## 2019-10-26 ENCOUNTER — Telehealth: Payer: Self-pay | Admitting: *Deleted

## 2019-10-26 NOTE — Telephone Encounter (Signed)
No answer. Left detailed message with results, ok per DPR, and to call back if any questions. Asked for him to call us back if he is still having dizziness.

## 2019-10-26 NOTE — Telephone Encounter (Signed)
-----   Message from Lennon Alstrom, PA-C sent at 10/25/2019 10:50 PM EDT ----- Please let Edward [name] know that his renal function showed a bump from his previous labs; however, his electrolytes are stable. He should continue to hydrate. Is he still dizzy? His blood counts are stable.

## 2019-10-26 NOTE — Telephone Encounter (Signed)
Patient calling back and verbalized understanding.  He is not dizzy anymore.  Issue now is the [facility]. He needs a refill. I let him know that we sent in a 3 month supply already today. He was appreciative.

## 2019-10-26 NOTE — Telephone Encounter (Signed)
*  STAT* If patient is at the pharmacy, call can be transferred to refill team.   1. Which medications need to be refilled? (please list name of each medication and dose if known) Entresto 24-26  2. Which pharmacy/location (including street and city if local pharmacy) is medication to be sent to? [facility] [place]  3. Do they need a 30 day or 90 day supply? 90 day if able   Patient is completely out and pharmacy had to loan some over the weekend

## 2019-11-13 ENCOUNTER — Emergency Department: Payer: No Typology Code available for payment source

## 2019-11-13 ENCOUNTER — Other Ambulatory Visit: Payer: Self-pay

## 2019-11-13 ENCOUNTER — Emergency Department
Admission: EM | Admit: 2019-11-13 | Discharge: 2019-11-13 | Disposition: A | Discharge status: Home / Self Care | Payer: No Typology Code available for payment source | Attending: Emergency Medicine | Admitting: Emergency Medicine

## 2019-11-13 ENCOUNTER — Encounter: Payer: Self-pay | Admitting: Emergency Medicine

## 2019-11-13 DIAGNOSIS — R1010 Upper abdominal pain, unspecified: Secondary | ICD-10-CM | POA: Diagnosis not present

## 2019-11-13 DIAGNOSIS — Z79899 Other long term (current) drug therapy: Secondary | ICD-10-CM | POA: Insufficient documentation

## 2019-11-13 DIAGNOSIS — R079 Chest pain, unspecified: Secondary | ICD-10-CM

## 2019-11-13 DIAGNOSIS — G629 Polyneuropathy, unspecified: Secondary | ICD-10-CM | POA: Diagnosis not present

## 2019-11-13 DIAGNOSIS — R0789 Other chest pain: Secondary | ICD-10-CM | POA: Diagnosis present

## 2019-11-13 DIAGNOSIS — Z7982 Long term (current) use of aspirin: Secondary | ICD-10-CM | POA: Insufficient documentation

## 2019-11-13 LAB — BASIC METABOLIC PANEL
Anion gap: 12 (ref 5–15)
BUN: 9 mg/dL (ref 6–20)
CO2: 24 mmol/L (ref 22–32)
Calcium: 9.6 mg/dL (ref 8.9–10.3)
Chloride: 100 mmol/L (ref 98–111)
Creatinine, Ser: 1.08 mg/dL (ref 0.61–1.24)
GFR calc Af Amer: >60 mL/min (ref >60)
GFR calc non Af Amer: >60 mL/min (ref >60)
Glucose, Bld: 112 mg/dL — ABNORMAL HIGH (ref 70–99)
Potassium: 3.5 mmol/L (ref 3.5–5.1)
Sodium: 136 mmol/L (ref 135–145)

## 2019-11-13 LAB — TROPONIN I (HIGH SENSITIVITY)
Troponin I (High Sensitivity): 11 ng/L (ref <18)
Troponin I (High Sensitivity): 12 ng/L (ref <18)

## 2019-11-13 LAB — HEPATIC FUNCTION PANEL
ALT: 80 U/L — ABNORMAL HIGH (ref 0–44)
AST: 93 U/L — ABNORMAL HIGH (ref 15–41)
Albumin: 4.3 g/dL (ref 3.5–5.0)
Alkaline Phosphatase: 89 U/L (ref 38–126)
Bilirubin, Direct: 0.2 mg/dL (ref 0.0–0.2)
Indirect Bilirubin: 0.8 mg/dL (ref 0.3–0.9)
Total Bilirubin: 1 mg/dL (ref 0.3–1.2)
Total Protein: 8.2 g/dL — ABNORMAL HIGH (ref 6.5–8.1)

## 2019-11-13 LAB — CBC
HCT: 46.5 % (ref 39.0–52.0)
Hemoglobin: 16.3 g/dL (ref 13.0–17.0)
MCH: 32.3 pg (ref 26.0–34.0)
MCHC: 35.1 g/dL (ref 30.0–36.0)
MCV: 92.1 fL (ref 80.0–100.0)
Platelets: 305 10*3/uL (ref 150–400)
RBC: 5.05 MIL/uL (ref 4.22–5.81)
RDW: 14.6 % (ref 11.5–15.5)
WBC: 7.9 10*3/uL (ref 4.0–10.5)
nRBC: 0 % (ref 0.0–0.2)

## 2019-11-13 LAB — MAGNESIUM: Magnesium: 2 mg/dL (ref 1.7–2.4)

## 2019-11-13 LAB — LIPASE, BLOOD: Lipase: 25 U/L (ref 11–51)

## 2019-11-13 LAB — ETHANOL: Alcohol, Ethyl (B): <10 mg/dL (ref <10)

## 2019-11-13 MED ORDER — SODIUM CHLORIDE 0.9 % IV BOLUS
500.0000 mL | Freq: Once | INTRAVENOUS | Status: AC
  Administered 2019-11-13: 500 mL via INTRAVENOUS

## 2019-11-13 MED ORDER — IOHEXOL 350 MG/ML SOLN
100.0000 mL | Freq: Once | INTRAVENOUS | Status: AC | PRN
  Administered 2019-11-13: 100 mL via INTRAVENOUS

## 2019-11-13 MED ORDER — SODIUM CHLORIDE 0.9% FLUSH
3.0000 mL | Freq: Once | INTRAVENOUS | Status: AC
  Administered 2019-11-13: 3 mL via INTRAVENOUS

## 2019-11-13 NOTE — Discharge Instructions (Addendum)
As we discussed, your work-up in the emergency department was reassuring tonight, with no evidence you are having a heart attack, no evidence of acute aortic syndrome, and no specific explanation for your symptoms.  It is likely that your ongoing drug and alcohol use are contributing to your symptoms as well as your chronic medical conditions.  But the symptoms are similar to those you discussed with the cardiology clinic follow-up 3 weeks ago.  Given no evidence of an emergent medical condition tonight, I recommend that you follow-up with Ms. Michaelle Birks in the cardiology clinic at the next available opportunity for further evaluation.  Let her know that you have been to the emergency department recently so she can check the records.  Try to avoid drugs including cocaine and try to decrease the amount of alcohol that you consume.  I recommend you take a daily multivitamin as well because vitamin deficiencies can also lead to numbness and tingling in your feet and in your extremities in general.  Continue taking your normal medications.    Return to the emergency department if you develop new or worsening symptoms that concern you.

## 2019-11-13 NOTE — ED Triage Notes (Signed)
Pt presents to ED with left sided chest pain since Thursday afternoon. Pt reports that his feet are feeling numb. Hx of 2 heart attacks last may.

## 2019-11-13 NOTE — ED Provider Notes (Signed)
[facility] Emergency Department Provider Note  ____________________________________________   First MD Initiated Contact with Patient 11/13/19 [phone]     (approximate)  I have reviewed the triage vital signs and the nursing notes.   HISTORY  Chief Complaint Chest Pain  Level 5 caveat: History is somewhat limited by the patient being a vague historian.  HPI Edward [name] is a 54 y.o. 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  He presents tonight for evaluation of left-sided chest pain that has been going on for about 12 hours.  He said that he last used cocaine about 2 days ago.  He has also been having numbness and tingling in both of his feet and occasionally in his hands and arms.  He has had these symptoms in the past, including the numbness and tingling and chest pain, but they are currently worse.  He currently still has some pain in the left side of his chest and identifies it as right around his left nipple for he thinks he feels a lump.  He is having no shortness of breath.  He denies nausea and vomiting.  He says he last used alcohol about 24 hours ago.  He denies fever/chills and abdominal pain.  Nothing in particular makes his symptoms better and pushing on the left side of his chest, specifically around the nipple, makes it worse.  He is not having back pain and he has no trouble with urinary incontinence, urinary retention, nor bowel movements.        Past Medical History:  Diagnosis Date  . Alcohol abuse   . Colon polyp 2016  . Herpes   . MI (myocardial infarction) ([place])   . Polysubstance abuse ([place])   . Vitamin B 12 deficiency     Patient Active Problem List   Diagnosis Date Noted  . Dilated cardiomyopathy ([place]) 01/08/2019  . Polysubstance abuse ([place]) 01/08/2019  . Anxiety  01/08/2019  . Chest pain 12/17/2018  . Leg pain 08/07/2018  . Neuropathy 06/23/2018  . Varicose veins of both lower extremities 06/23/2018  . Bilateral cold feet 06/23/2018    Past Surgical History:  Procedure Laterality Date  . ANAL FISTULOTOMY  04/03/2016   Procedure: ANAL FISTULOTOMY;  Surgeon: Christene Lye, MD;  Location: [facility] ORS;  Service: General;;  . BLADDER SURGERY    . COLONOSCOPY  07/06/2015   Dr Rayann Heman  . KNEE ARTHROSCOPY Left   . LEFT HEART CATH AND CORONARY ANGIOGRAPHY N/A 12/19/2018   Procedure: LEFT HEART CATH AND CORONARY ANGIOGRAPHY;  Surgeon: Minna Merritts, MD;  Location: [facility];  Service: Cardiovascular;  Laterality: N/A;  . UPPER GASTROINTESTINAL ENDOSCOPY  07/06/2015    Prior to Admission medications   Medication Sig Start Date End Date Taking? Authorizing Provider  acyclovir (ZOVIRAX) 400 MG tablet Take 400 mg by mouth 3 (three) times daily. 07/26/19   [provider]  allopurinol (ZYLOPRIM) 100 MG tablet Take 100 mg by mouth daily. 09/16/19   [provider]  aspirin 81 MG chewable tablet Chew 1 tablet (81 mg total) by mouth daily. 12/21/18   Vaughan Basta, MD  busPIRone (BUSPAR) 10 MG tablet Take 10 mg by mouth 2 (two) times daily. 10/23/19   [provider]  carvedilol (COREG) 12.5 MG tablet Take 0.5 tablets (6.25 mg total) by mouth  2 (two) times daily. 04/27/19   Antonieta Iba, MD  colchicine 0.6 MG tablet Take 2 tabs PO x 1 with initial flare, then 1 tab PO 1 hour later x 1. Then restart 1 BID tomorrow 07/28/19   Menshew, Charlesetta Ivory, PA-C  furosemide (LASIX) 40 MG tablet Take 1 tablet (40 mg total) by mouth daily. 01/09/19   Antonieta Iba, MD  gabapentin (NEURONTIN) 100 MG capsule Take 100 mg by mouth 5 (five) times daily as needed. 10/23/19   [provider]  ibuprofen (ADVIL) 600 MG tablet Take 1 tablet (600 mg total) by mouth every 8 (eight) hours as needed. 07/20/19   Joni Reining, PA-C  Omega-3 Fatty Acids (FISH OIL) 1000 MG CPDR Take 1,000 mg by mouth daily.     [provider]  sacubitril-valsartan (ENTRESTO) 24-26 MG Take 1 tablet by mouth 2 (two) times daily. 10/26/19   Antonieta Iba, MD  spironolactone (ALDACTONE) 25 MG tablet Take 0.5 tablets (12.5 mg total) by mouth daily. 01/09/19   Antonieta Iba, MD  traMADol (ULTRAM) 50 MG tablet Take 1 tablet (50 mg total) by mouth every 6 (six) hours as needed. 07/20/19 07/19/20  Joni Reining, PA-C  vitamin B-12 (CYANOCOBALAMIN) 500 MCG tablet Take 500 mcg by mouth daily.    [provider]  Zinc Sulfate (ZINC-220 PO) Take 220 mg by mouth daily.     [provider]    Allergies Codeine  Family History  Problem Relation Age of Onset  . Colon cancer Maternal Grandmother   . Prostate cancer Paternal Uncle   . Leukemia Paternal Uncle   . Heart failure Father   . CAD Sister     Social History Social History   Tobacco Use  . Smoking status: Never Smoker  . Smokeless tobacco: Never Used  Substance Use Topics  . Alcohol use: Yes    Comment: moderate use- does not like to quantify  . Drug use: Yes    Types: Marijuana, Cocaine    Comment: cocaine use a few days ago     Review of Systems Level 5 caveat: History is somewhat limited by the patient being a vague historian.  Constitutional: No fever/chills Eyes: No visual changes. ENT: No sore throat. Cardiovascular: +chest pain centered specifically around the nipples, left greater than right. Respiratory: Denies shortness of breath. Gastrointestinal: No abdominal pain.  No nausea, no vomiting.  No diarrhea.  No constipation. Genitourinary: Negative for dysuria. Musculoskeletal: Negative for neck pain.  Negative for back pain. Integumentary: Negative for rash. Neurological: Numbness and tingling in his arms and legs (mostly in feet), intermittent but persistent and worse than usual.  No focal  weakness.   ____________________________________________   PHYSICAL EXAM:  VITAL SIGNS: ED Triage Vitals  Enc Vitals Group     BP 11/13/19 0316 (!) 141/84     Pulse Rate 11/13/19 0316 74     Resp 11/13/19 0316 18     Temp 11/13/19 0316 98.1 F (36.7 C)     Temp Source 11/13/19 0316 Oral     SpO2 11/13/19 0316 97 %     Weight 11/13/19 0312 90.7 kg (200 lb)     Height 11/13/19 0312 1.905 m (6\' 3" )     Head Circumference --      Peak Flow --      Pain Score 11/13/19 0312 10     Pain Loc --      Pain Edu? --  Excl. in GC? --     Constitutional: Alert and oriented.  Eyes: Conjunctivae are normal.  Head: Atraumatic. Nose: No congestion/rhinnorhea. Mouth/Throat: Patient is wearing a mask. Neck: No stridor.  No meningeal signs.   Cardiovascular: Normal rate, regular rhythm. Good peripheral circulation. Grossly normal heart sounds. Respiratory: Normal respiratory effort.  No retractions. Gastrointestinal: Soft and nontender. No distention.  Musculoskeletal: Reproducible tenderness to palpation on the superior aspect of the left areola.  No palpable mass but the areolar tissue feels firm.  No evidence of abscess nor cellulitis.  No lower extremity tenderness nor edema. No gross deformities of extremities. Neurologic:  Normal speech and language. No gross focal neurologic deficits are appreciated.  Skin:  Skin is warm, dry and intact. Psychiatric: Mood and affect are normal. Speech and behavior are normal.  ____________________________________________   LABS (all labs ordered are listed, but only abnormal results are displayed)  Labs Reviewed  BASIC METABOLIC PANEL - Abnormal; Notable for the following components:      Result Value   Glucose, Bld 112 (*)    All other components within normal limits  HEPATIC FUNCTION PANEL - Abnormal; Notable for the following components:   Total Protein 8.2 (*)    AST 93 (*)    ALT 80 (*)    All other components within normal limits   CBC  LIPASE, BLOOD  MAGNESIUM  ETHANOL  URINE DRUG SCREEN, QUALITATIVE ([facility] ONLY)  TROPONIN I (HIGH SENSITIVITY)  TROPONIN I (HIGH SENSITIVITY)   ____________________________________________  EKG  ED ECG REPORT I, Loleta Rose, the attending physician, personally viewed and interpreted this ECG.  Date: 11/13/2019 EKG Time: 3:11 AM Rate: 80 Rhythm: normal sinus rhythm QRS Axis: normal Intervals: normal ST/T Wave abnormalities: Non-specific ST segment / T-wave changes, but no clear evidence of acute ischemia.  Specifically he has inverted or notched T waves most notable in leads V4 through V6. Narrative Interpretation: no definitive evidence of acute ischemia; does not meet STEMI criteria.  EKG is very similar with no substantial changes from the last EKG obtained.   ____________________________________________  RADIOLOGY I, Loleta Rose, personally viewed and evaluated these images (plain radiographs) as part of my medical decision making, as well as reviewing the written report by the radiologist.  ED MD interpretation: I reviewed the chest x-ray and there is no evidence of any acute abnormality.  CTA chest/abdomen/pelvis demonstrates no acute abnormalities, including no AAS and no PE.  Official radiology report(s): DG Chest 2 View  Result Date: 11/13/2019 CLINICAL DATA:  Chest pain EXAM: CHEST - 2 VIEW COMPARISON:  Chest radiograph 12/17/2018 FINDINGS: The heart size and mediastinal contours are within normal limits. Both lungs are clear. The visualized skeletal structures are unremarkable. IMPRESSION: No active cardiopulmonary disease. Electronically Signed   By: Deatra Robinson M.D.   On: 11/13/2019 03:58   CT Angio Chest/Abd/Pel for Dissection W and/or W/WO  Result Date: 11/13/2019 CLINICAL DATA:  Chest pain on the left since Thursday with acute aortic syndrome suspected. EXAM: CT ANGIOGRAPHY CHEST, ABDOMEN AND PELVIS TECHNIQUE: Non-contrast CT of the chest was initially  obtained. Multidetector CT imaging through the chest, abdomen and pelvis was performed using the standard protocol during bolus administration of intravenous contrast. Multiplanar reconstructed images and MIPs were obtained and reviewed to evaluate the vascular anatomy. CONTRAST:  OMNIPAQUE IOHEXOL 350 MG/ML SOLN COMPARISON:  12/17/2018 chest CTA FINDINGS: CTA CHEST FINDINGS Cardiovascular: No intramural hematoma seen on noncontrast phase. No aortic dissection or aneurysm. Mild atherosclerotic calcification of  the aorta. Atherosclerotic calcifications seen along the LAD. Patient has known history of coronary artery disease per the chart. No pulmonary artery filling defects. Mediastinum/Nodes: No adenopathy, mass, or pneumomediastinum. Lungs/Pleura: Generalized airway thickening. There is no edema, consolidation, effusion, or pneumothorax. Musculoskeletal: Remote right mid clavicle fracture. No acute or aggressive finding. Review of the MIP images confirms the above findings. CTA ABDOMEN AND PELVIS FINDINGS VASCULAR Aorta: No aneurysm or dissection. There is scattered atheromatous plaque. Celiac: Patent without evidence of aneurysm, dissection, vasculitis or significant stenosis. SMA: Replaced right hepatic artery. Vessels are smooth and widely patent. Renals: Single bilateral renal artery with early branching on the right. Vessels are smooth and widely patent. IMA: Patent.  There is plaque at the origin Inflow: Scattered atheromatous plaque without stenosis or ulceration. Veins: Negative in the arterial phase Review of the MIP images confirms the above findings. NON-VASCULAR Hepatobiliary: No focal liver abnormality. Hepatic steatosis. No evidence of biliary obstruction or stone. Pancreas: Unremarkable. Spleen: Unremarkable. Adrenals/Urinary Tract: Negative adrenals. No hydronephrosis or stone. Unremarkable bladder. Stomach/Bowel: No obstruction. No appendicitis. Colonic diverticulosis distally. Lymphatic: No  mass or adenopathy. Reproductive:No pathologic findings. Other: No ascites or pneumoperitoneum. Musculoskeletal: No acute abnormalities. Lumbar spine degeneration with slight dextroscoliosis and prominent foraminal narrowings. Review of the MIP images confirms the above findings. IMPRESSION: 1. No evidence of acute aortic syndrome. No acute finding in the chest or abdomen. 2.  Aortic Atherosclerosis (ICD10-I70.0).  Coronary atherosclerosis. 3. Hepatic steatosis and colonic diverticulosis. Electronically Signed   By: Marnee Spring M.D.   On: 11/13/2019 05:34    ____________________________________________   PROCEDURES   Procedure(s) performed (including Critical Care):  .1-3 Lead EKG Interpretation Performed by: Loleta Rose, MD Authorized by: Loleta Rose, MD     Interpretation: abnormal     ECG rate:  52   ECG rate assessment: bradycardic     Rhythm: sinus bradycardia     Ectopy: none     Conduction: normal       ____________________________________________   INITIAL IMPRESSION / MDM / ASSESSMENT AND PLAN / ED COURSE  As part of my medical decision making, I reviewed the following data within the electronic MEDICAL RECORD NUMBER Nursing notes reviewed and incorporated, Labs reviewed , EKG interpreted , Old EKG reviewed, Old chart reviewed, Radiograph reviewed , Notes from prior ED visits and [place] Controlled Substance Database   Differential diagnosis includes, but is not limited to, musculoskeletal pain, Prinzmetal angina, aortic dissection, ACS, electrolyte or metabolic abnormality.  The patient has ongoing polysubstance issues.  He was catheterized less than a year ago and had no significant vessel occlusion.  I suspect his pain is secondary to his chronic medical issues and recent drug use, but he is also a set up for acute aortic syndrome given his comorbidities, hypertension, drug use, etc.  He had a CTA chest to evaluate for PE during his last hospitalization but given the  worsening neurological symptoms in the setting of chest pain, hypertension, drug abuse, I will evaluate with a CTA chest/abdomen/pelvis to rule out dissection.  The initial high-sensitivity troponin was 11 and I will obtain a repeat.  I have added on hepatic function tests and lipase.  Initial basic metabolic panel was within normal limits.  CBC is normal.  I have also ordered 500 mL normal saline IV bolus to help flush the IV contrast.  The patient is on the cardiac monitor to evaluate for evidence of arrhythmia and/or significant heart rate changes.      Clinical  Course as of Nov 13 635  Fri Nov 13, 2019  0430 Lipase: 25 [CF]  0445 Magnesium: 2.0 [CF]  0452 Alcohol, Ethyl (B): <10 [CF]  0452 Mild AST and ALT elevation likely due to chronic alcohol use, but evidence of acute abnormality.  Hepatic function panel(!) [CF]  [id number] No acute abnormalities identified in the chest, abdomen, nor pelvis including no dissection, and no cutaneous abnormalities identified by the radiologist around the left left nipple (such as a mass).  CT Angio Chest/Abd/Pel for Dissection W and/or W/WO [CF]  0604 Second high-sensitivity troponin is 12 which is essentially unchanged and the patient should be appropriate for discharge and outpatient follow-up with cardiology.  I will update him and encouraged him to avoid cocaine, and to try to cut back on his alcohol use.   [CF]  [phone] I updated the patient about his results.  I also reviewed his medical record again and I found a note from about 3 weeks ago (10/18/2019) from a cardiology clinic where he described essentially the exact same symptoms that he presented with tonight, specifically chest pain centered around his nipples and numbness and tingling in his feet and his legs.  They are in the process of evaluating him for this but it is reassuring to know that this is not an acute issue.I discussed the findings with him and he is understandably frustrated that I cannot  explain nor fix the polyneuropathy.  I reiterated that there is no evidence of an emergent medical condition tonight and that I cannot specifically identify or fix the problem but I encouraged him to return to his regular doctor for additional evaluation and treatment.  I stressed that he should avoid additional substance use (specifically cocaine) and decrease the use of alcohol although he may not want to completely stop drinking since that could lead to withdrawal symptoms.  I gave my usual and customary return precautions.   [CF]    Clinical Course User Index [CF] Loleta Rose, MD     ____________________________________________  FINAL CLINICAL IMPRESSION(S) / ED DIAGNOSES  Final diagnoses:  Chest pain, unspecified type  Peripheral polyneuropathy     MEDICATIONS GIVEN DURING THIS VISIT:  Medications  sodium chloride flush (NS) 0.9 % injection 3 mL (3 mLs Intravenous Given 11/13/19 0337)  sodium chloride 0.9 % bolus 500 mL (500 mLs Intravenous New Bag/Given 11/13/19 0419)  iohexol (OMNIPAQUE) 350 MG/ML injection 100 mL (100 mLs Intravenous Contrast Given 11/13/19 9381)     ED Discharge Orders    None      *Please note:  YAASIR MENKEN was evaluated in Emergency Department on 11/13/2019 for the symptoms described in the history of present illness. He was evaluated in the context of the global COVID-19 pandemic, which necessitated consideration that the patient might be at risk for infection with the SARS-CoV-2 virus that causes COVID-19. Institutional protocols and algorithms that pertain to the evaluation of patients at risk for COVID-19 are in a state of rapid change based on information released by regulatory bodies including the CDC and federal and state organizations. These policies and algorithms were followed during the patient's care in the ED.  Some ED evaluations and interventions may be delayed as a result of limited staffing during the pandemic.*  Note:  This document was  prepared using Dragon voice recognition software and may include unintentional dictation errors.   Loleta Rose, MD 11/13/19 [phone]

## 2019-11-16 ENCOUNTER — Other Ambulatory Visit: Payer: Self-pay | Admitting: Cardiovascular Disease

## 2019-11-25 ENCOUNTER — Other Ambulatory Visit: Payer: Self-pay

## 2019-11-25 ENCOUNTER — Other Ambulatory Visit: Payer: Self-pay | Admitting: Physician Assistant

## 2019-11-25 DIAGNOSIS — M79605 Pain in left leg: Secondary | ICD-10-CM

## 2019-11-25 DIAGNOSIS — M79604 Pain in right leg: Secondary | ICD-10-CM

## 2019-11-25 DIAGNOSIS — R208 Other disturbances of skin sensation: Secondary | ICD-10-CM

## 2019-11-25 DIAGNOSIS — R200 Anesthesia of skin: Secondary | ICD-10-CM

## 2019-12-04 ENCOUNTER — Ambulatory Visit: Payer: No Typology Code available for payment source | Admitting: Physician Assistant

## 2019-12-07 ENCOUNTER — Encounter: Payer: Self-pay | Admitting: Physician Assistant

## 2019-12-11 ENCOUNTER — Other Ambulatory Visit: Payer: Self-pay

## 2019-12-11 DIAGNOSIS — M79604 Pain in right leg: Secondary | ICD-10-CM

## 2019-12-24 ENCOUNTER — Other Ambulatory Visit: Payer: Self-pay

## 2019-12-25 ENCOUNTER — Other Ambulatory Visit: Payer: Self-pay

## 2019-12-25 MED ORDER — CARVEDILOL 12.5 MG PO TABS: 6.2500 mg | ORAL_TABLET | Freq: Two times a day (BID) | ORAL | 3 refills | Status: DC

## 2020-01-14 ENCOUNTER — Other Ambulatory Visit: Payer: Self-pay

## 2020-01-14 MED ORDER — FUROSEMIDE 40 MG PO TABS: 40.0000 mg | ORAL_TABLET | Freq: Every day | ORAL | 2 refills | Status: AC

## 2020-01-17 ENCOUNTER — Other Ambulatory Visit: Payer: Self-pay | Admitting: Cardiovascular Disease

## 2020-01-18 ENCOUNTER — Ambulatory Visit (INDEPENDENT_AMBULATORY_CARE_PROVIDER_SITE_OTHER): Payer: Self-pay

## 2020-01-18 ENCOUNTER — Other Ambulatory Visit: Payer: Self-pay

## 2020-01-18 DIAGNOSIS — M79604 Pain in right leg: Secondary | ICD-10-CM

## 2020-01-18 DIAGNOSIS — M79605 Pain in left leg: Secondary | ICD-10-CM

## 2020-01-19 IMAGING — US VENOUS DOPPLER ULTRASOUND OF LEFT LOWER EXTREMITY
1 series · 13 of 24 positions shown · non-contrast
Comparison: None.

CLINICAL DATA: Left lower extremity pain and edema.



[Series 1: venous doppler ultrasound of left lower extremity · 13 of 43 slices shown]
[im 1/43]
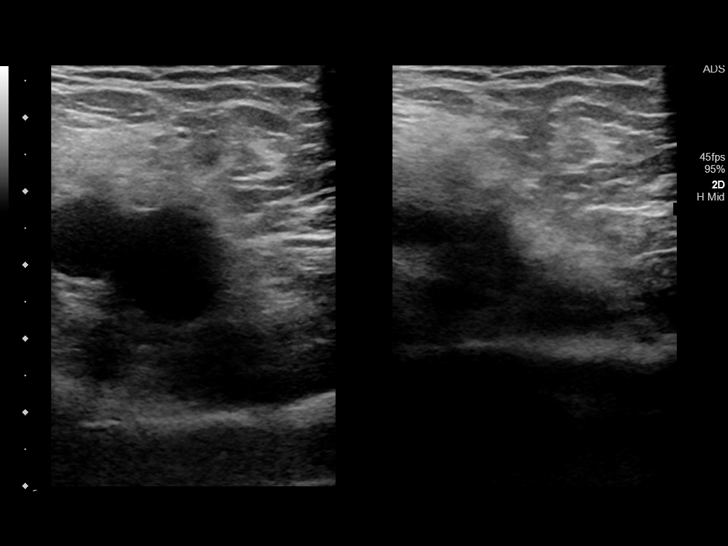
[im 4/43]
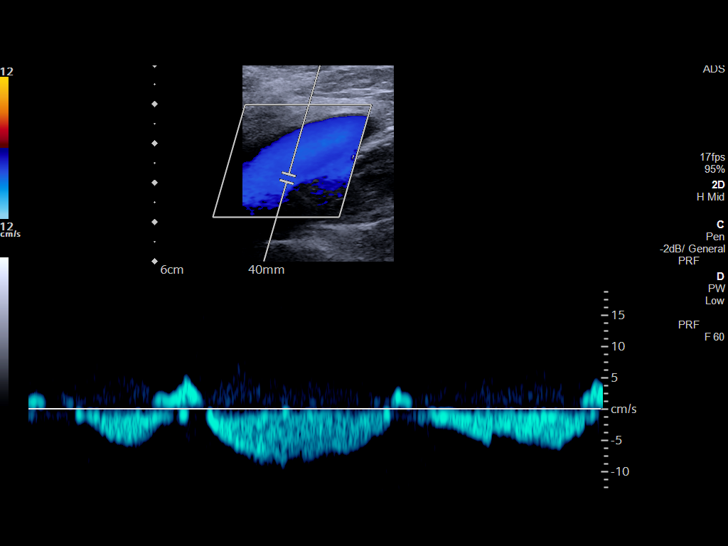
[im 8/43]
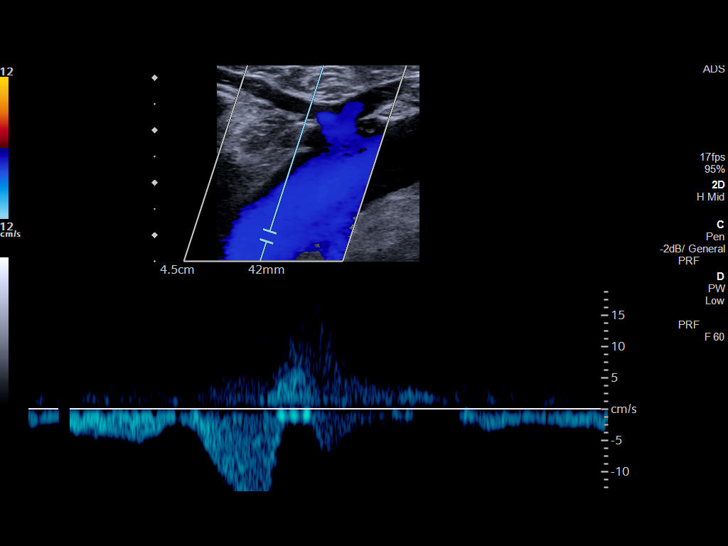
[im 11/43]
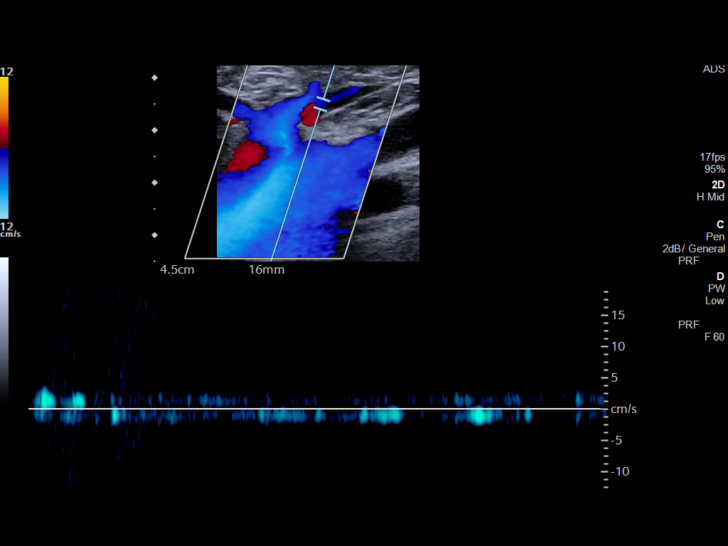
[im 15/43]
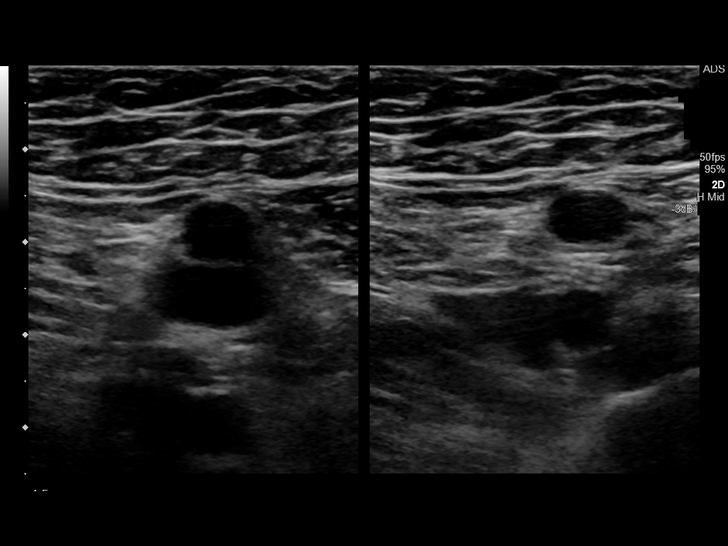
[im 19/43]
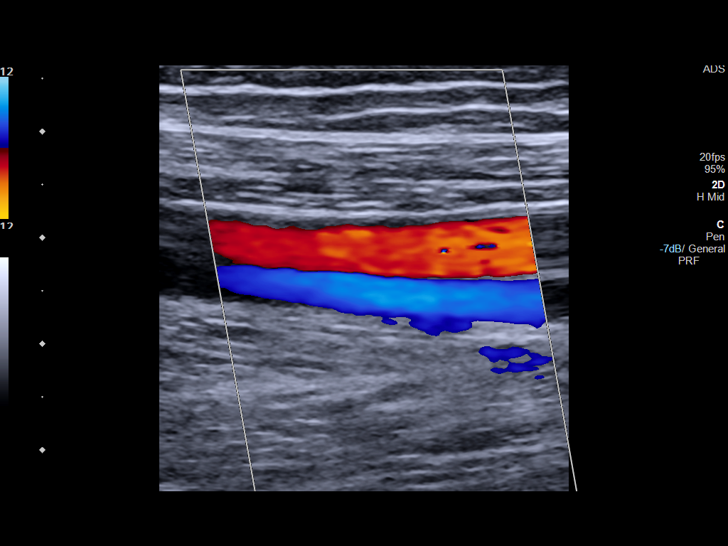
[im 22/43]
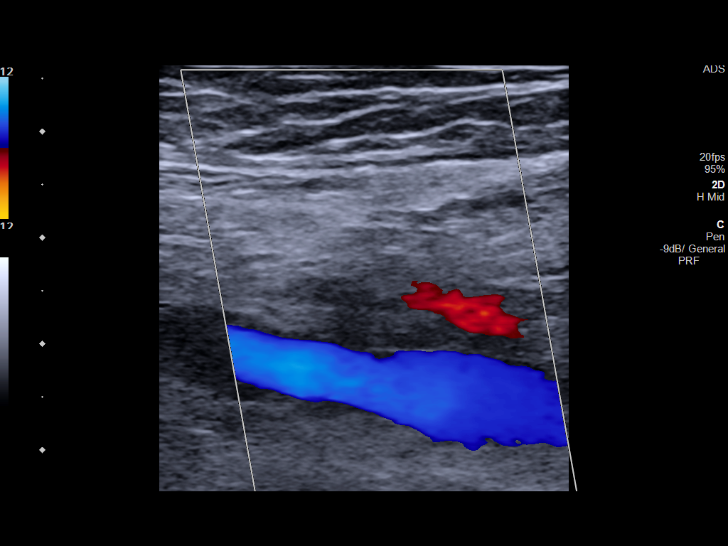
[im 24/43]
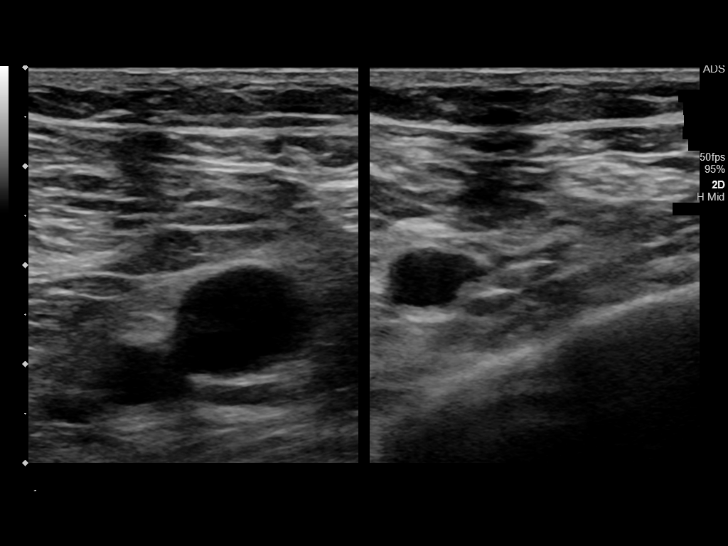
[im 28/43]
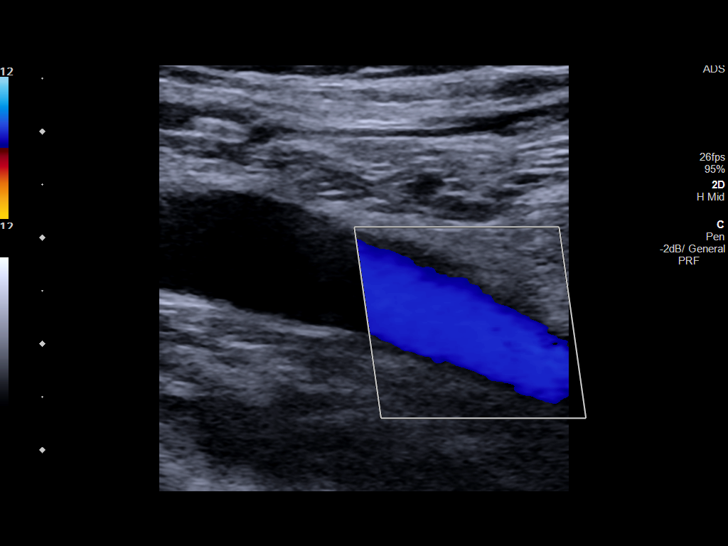
[im 32/43]
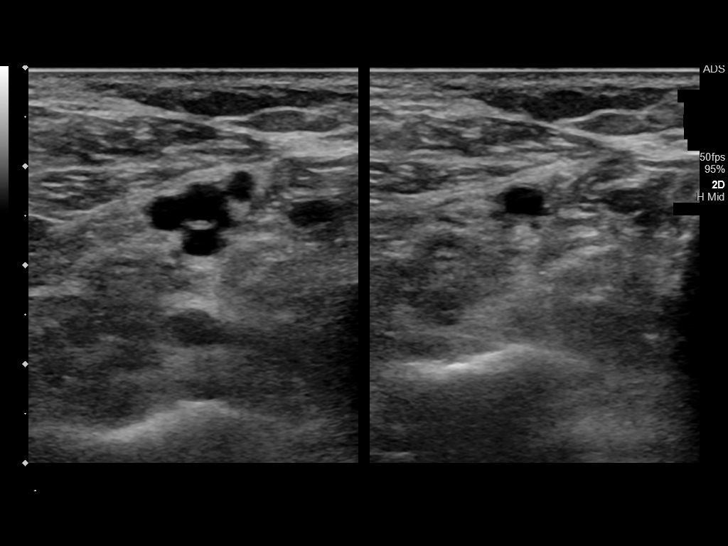
[im 35/43]
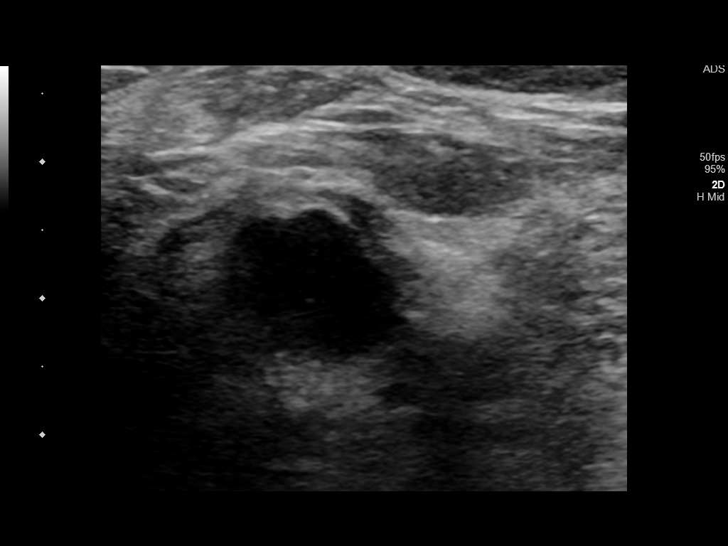
[im 39/43]
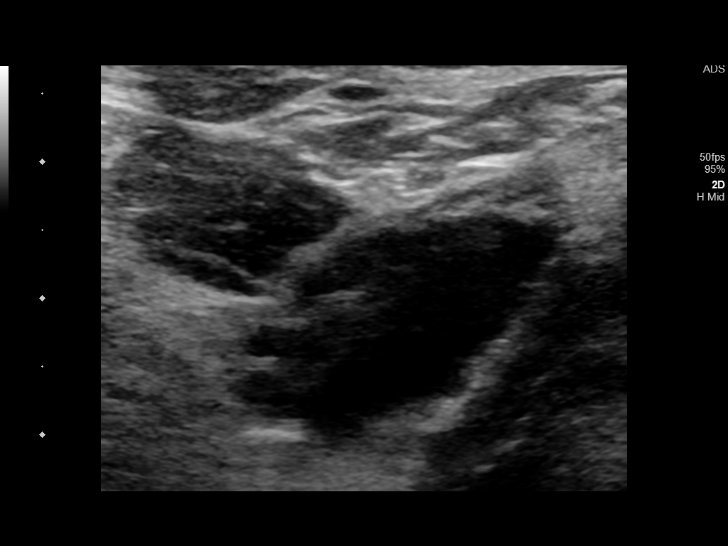
[im 43/43]
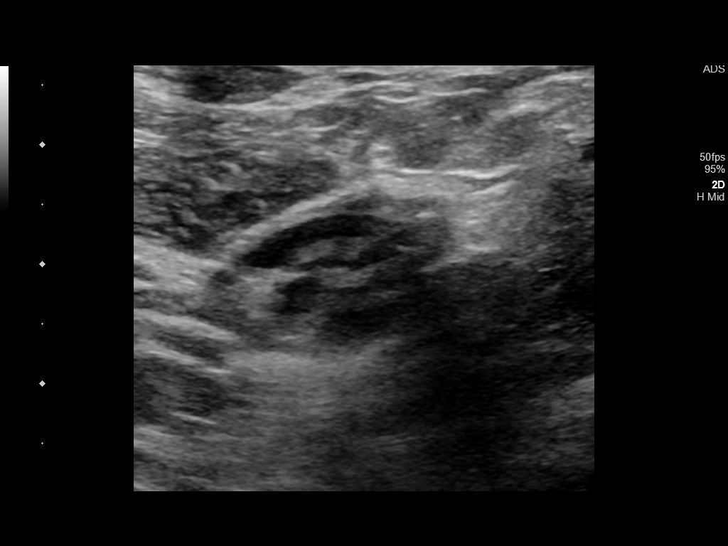

[13 of 24 positions shown; findings below may reference images not displayed]

FINDINGS: Contralateral Common Femoral Vein: Respiratory phasicity is normal
and symmetric with the symptomatic side. No evidence of thrombus.
Normal compressibility.

Common Femoral Vein: No evidence of thrombus. Normal
compressibility, respiratory phasicity and response to augmentation.

Saphenofemoral Junction: No evidence of thrombus. Normal
compressibility and flow on color Doppler imaging.

Profunda Femoral Vein: No evidence of thrombus. Normal
compressibility and flow on color Doppler imaging.

Femoral Vein: No evidence of thrombus. Normal compressibility,
respiratory phasicity and response to augmentation.

Popliteal Vein: No evidence of thrombus. Normal compressibility,
respiratory phasicity and response to augmentation.

Calf Veins: No evidence of thrombus. Normal compressibility and flow
on color Doppler imaging.

Superficial Great Saphenous Vein: No evidence of thrombus. Normal
compressibility.

Venous Reflux:  None.

Other Findings: No evidence of superficial thrombophlebitis or
abnormal fluid collection.
IMPRESSION: No evidence of left lower extremity deep venous thrombosis.

## 2020-01-22 ENCOUNTER — Other Ambulatory Visit: Payer: Self-pay | Admitting: Cardiovascular Disease

## 2020-01-25 ENCOUNTER — Telehealth: Payer: Self-pay

## 2020-01-25 NOTE — Telephone Encounter (Signed)
-----   Message from Lennon Alstrom, PA-C sent at 01/25/2020 11:01 AM EDT ----- Scan without evidence of lower extremity arterial disease and essential unchanged from previous study. Reassuring.

## 2020-01-25 NOTE — Telephone Encounter (Signed)
Call attempted. No answer, no vm. 

## 2020-02-12 NOTE — Telephone Encounter (Signed)
Call attempted. No answer, no vm. 

## 2020-02-16 NOTE — Telephone Encounter (Signed)
Call attempted. No answer, no vm.   Letter mailed to patient.   [address]    [place] [address]   Dear Mr. Edward [name],   ?   This letter is to inform you to call the office for multiple missed calls to discuss results. We have made 3 attempts to reach you by phone @ [phone] and have been unable to leave a voicemail.    ?   Please return call to clinic so we can discuss your plan of care. If you would like to update your account with any new numbers you can do so at that time.    ?   Thank you for allowing [place] to care for you.   ?   ?   Sincerely,   ?   Shalunda Lindh RN

## 2020-07-13 ENCOUNTER — Other Ambulatory Visit: Payer: Self-pay | Admitting: Physician Assistant

## 2020-07-13 DIAGNOSIS — M79672 Pain in left foot: Secondary | ICD-10-CM

## 2020-11-25 IMAGING — CT CT ANGIO CHEST-ABD-PELV FOR DISSECTION W/ AND WO/W CM
2 of 7 series · 13 of 46 positions shown, 15 images · IV contrast (APPLIED)
Comparison: 12/17/2018 chest CTA

CLINICAL DATA: Chest pain on the left since [REDACTED] with acute
aortic syndrome suspected.

EXAM:
CT ANGIOGRAPHY CHEST, ABDOMEN AND PELVIS
TECHNIQUE: Non-contrast CT of the chest was initially obtained.

[Series 6: axial arterial · axial · arterial · 0.75mm/px · z∈[-1020,-435]mm · 10 of 226 slices shown, 12 images]
[im 16/226  soft-tissue]
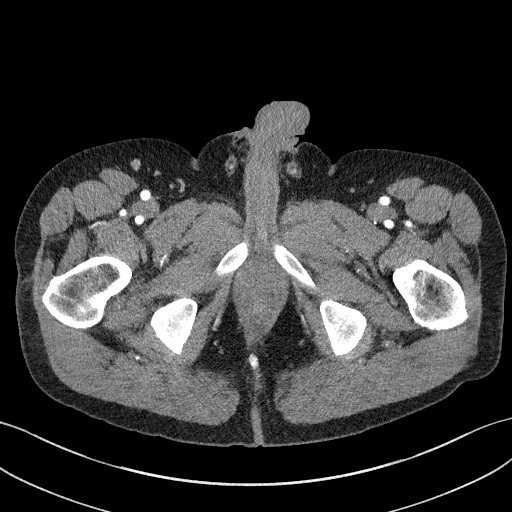
[im 16/226  bone]
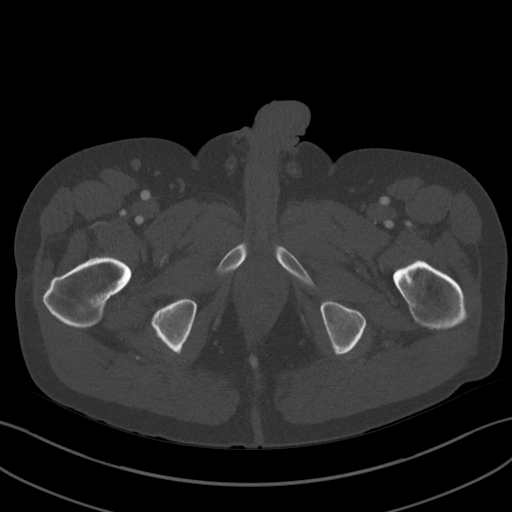
[im 46/226  soft-tissue]
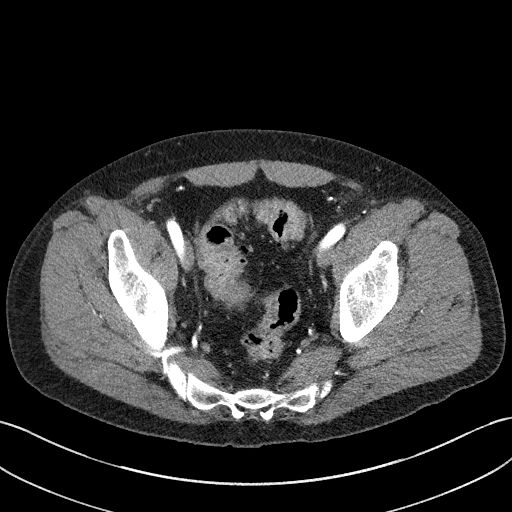
[im 61/226  soft-tissue]
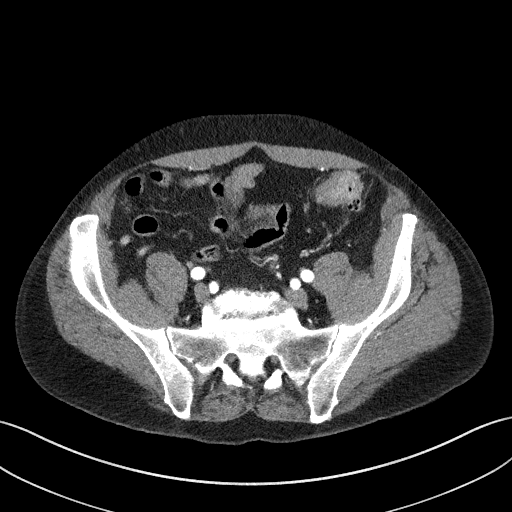
[im 76/226  soft-tissue]
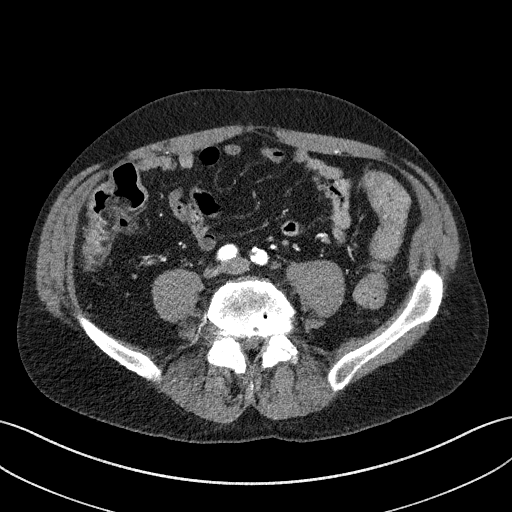
[im 106/226  soft-tissue]
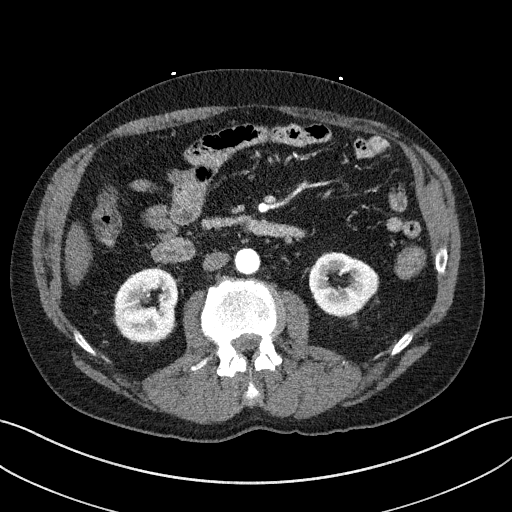
[im 121/226  soft-tissue]
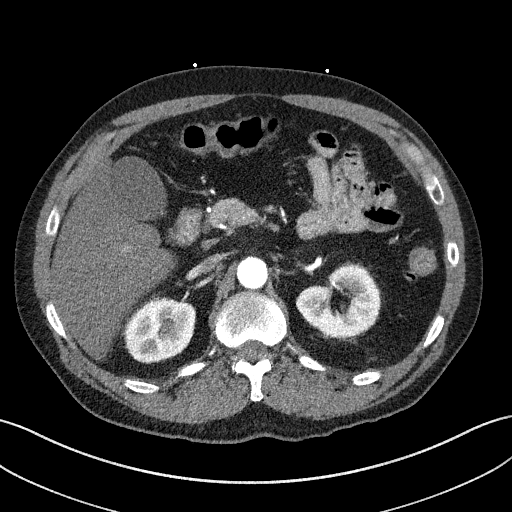
[im 151/226  soft-tissue]
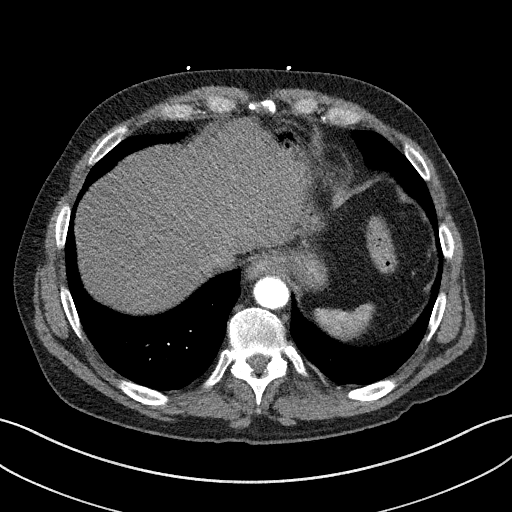
[im 166/226  soft-tissue]
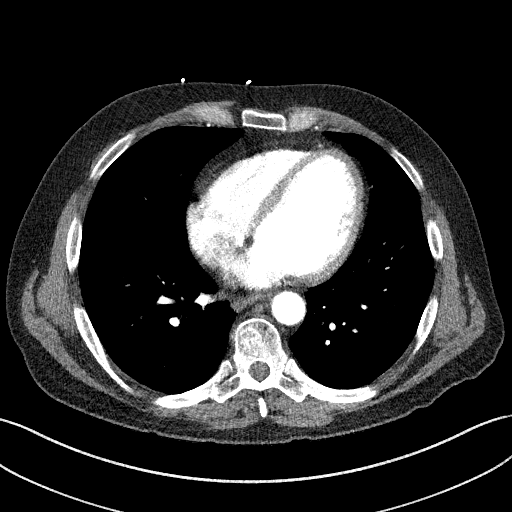
[im 181/226  soft-tissue]
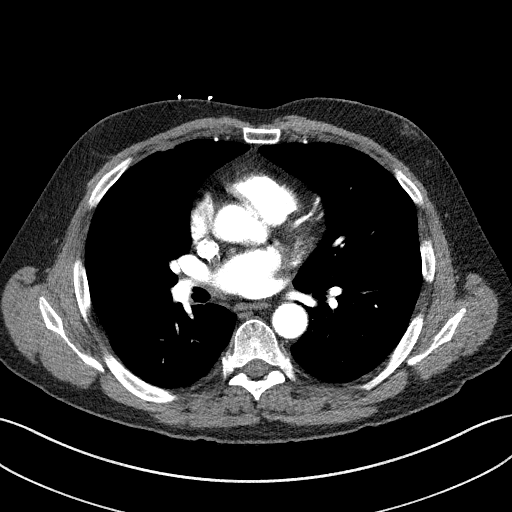
[im 181/226  bone]
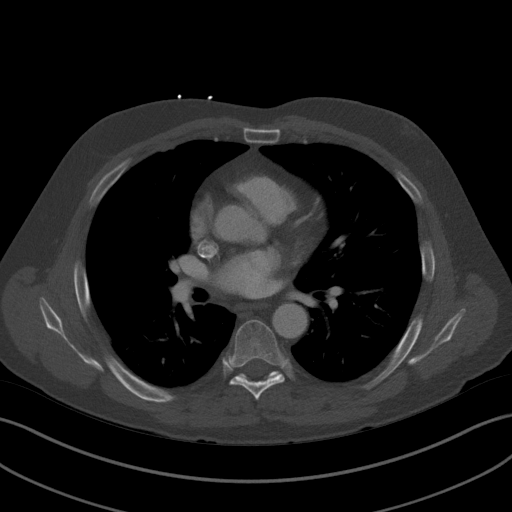
[im 211/226  soft-tissue]
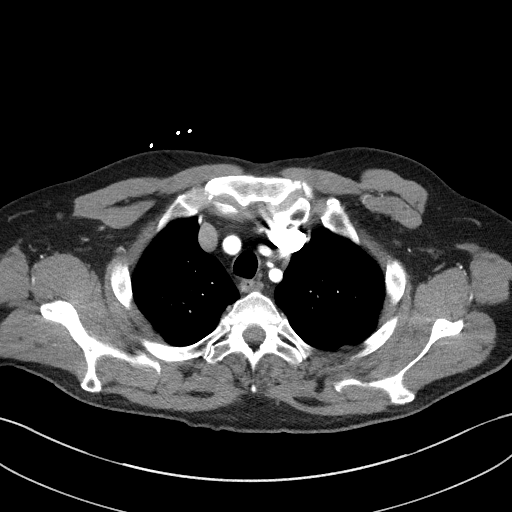

[Series 8: coronals · coronal · 0.71mm/px · 3 of 140 slices shown]
[im 35/140  soft-tissue]
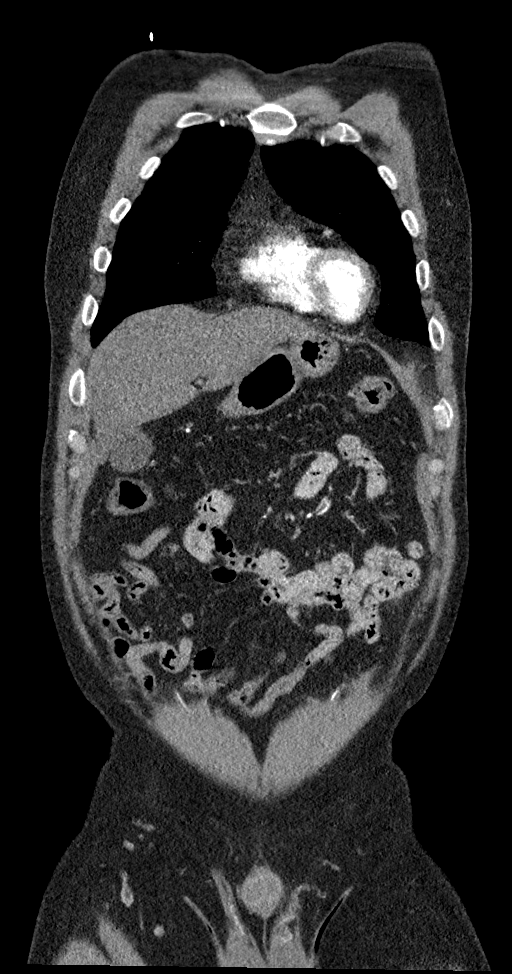
[im 70/140  soft-tissue]
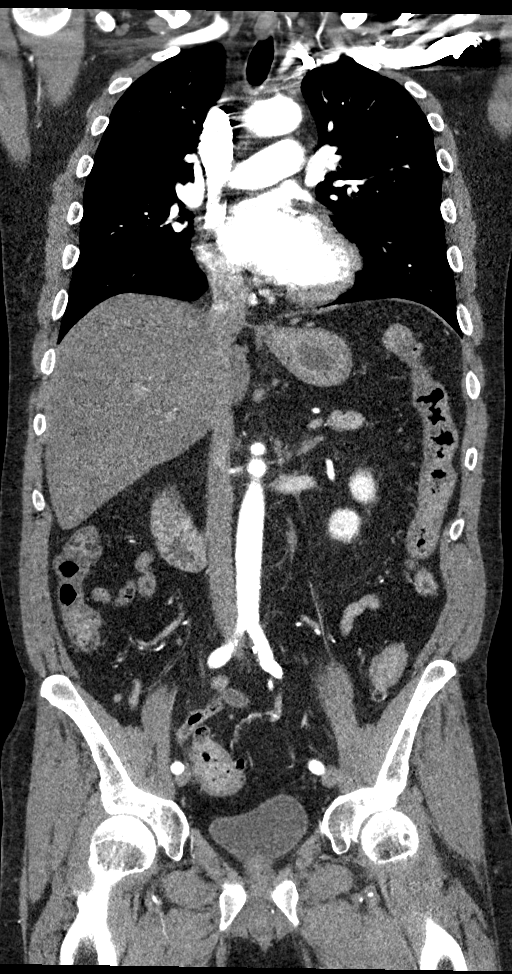
[im 105/140  soft-tissue]
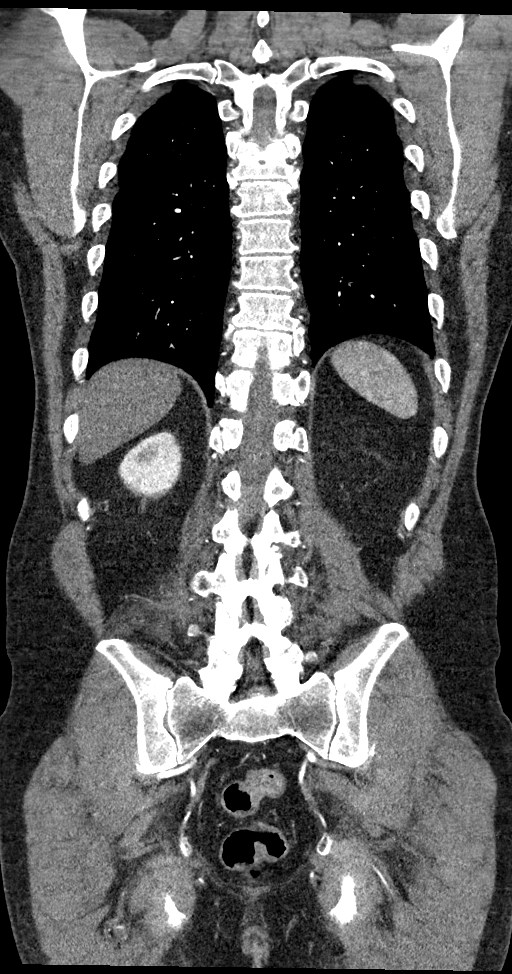

[13 of 46 positions shown; findings below may reference images not displayed]

Multidetector CT imaging through the chest, abdomen and pelvis was
performed using the standard protocol during bolus administration of
intravenous contrast. Multiplanar reconstructed images and MIPs were
obtained and reviewed to evaluate the vascular anatomy.

CONTRAST:  100mL OMNIPAQUE IOHEXOL 350 MG/ML SOLN
FINDINGS: CTA CHEST FINDINGS

Cardiovascular: No intramural hematoma seen on noncontrast phase. No
aortic dissection or aneurysm. Mild atherosclerotic calcification of
the aorta. Atherosclerotic calcifications seen along the LAD.
Patient has known history of coronary artery disease per the chart.
No pulmonary artery filling defects.

Mediastinum/Nodes: No adenopathy, mass, or pneumomediastinum.

Lungs/Pleura: Generalized airway thickening. There is no edema,
consolidation, effusion, or pneumothorax.

Musculoskeletal: Remote right mid clavicle fracture. No acute or
aggressive finding.

Review of the MIP images confirms the above findings.

CTA ABDOMEN AND PELVIS FINDINGS

VASCULAR

Aorta: No aneurysm or dissection. There is scattered atheromatous
plaque.

Celiac: Patent without evidence of aneurysm, dissection, vasculitis
or significant stenosis.

SMA: Replaced right hepatic artery. Vessels are smooth and widely
patent.

Renals: Single bilateral renal artery with early branching on the
right. Vessels are smooth and widely patent.

IMA: Patent.  There is plaque at the origin

Inflow: Scattered atheromatous plaque without stenosis or
ulceration.

Veins: Negative in the arterial phase

Review of the MIP images confirms the above findings.

NON-VASCULAR

Hepatobiliary: No focal liver abnormality. Hepatic steatosis. No
evidence of biliary obstruction or stone.

Pancreas: Unremarkable.

Spleen: Unremarkable.

Adrenals/Urinary Tract: Negative adrenals. No hydronephrosis or
stone. Unremarkable bladder.

Stomach/Bowel: No obstruction. No appendicitis. Colonic
diverticulosis distally.

Lymphatic: No mass or adenopathy.

Reproductive:No pathologic findings.

Other: No ascites or pneumoperitoneum.

Musculoskeletal: No acute abnormalities. Lumbar spine degeneration
with slight dextroscoliosis and prominent foraminal narrowings.

Review of the MIP images confirms the above findings.
IMPRESSION: 1. No evidence of acute aortic syndrome. No acute finding in the
chest or abdomen.
2.  Aortic Atherosclerosis ([id number]).  Coronary atherosclerosis.
3. Hepatic steatosis and colonic diverticulosis.

## 2022-11-16 DIAGNOSIS — M7989 Other specified soft tissue disorders: Secondary | ICD-10-CM | POA: Diagnosis not present

## 2022-11-16 DIAGNOSIS — Z1389 Encounter for screening for other disorder: Secondary | ICD-10-CM | POA: Diagnosis not present

## 2022-11-16 DIAGNOSIS — Z0131 Encounter for examination of blood pressure with abnormal findings: Secondary | ICD-10-CM | POA: Diagnosis not present

## 2023-02-26 DIAGNOSIS — I10 Essential (primary) hypertension: Secondary | ICD-10-CM | POA: Diagnosis not present

## 2023-02-26 DIAGNOSIS — B360 Pityriasis versicolor: Secondary | ICD-10-CM | POA: Diagnosis not present

## 2023-02-26 DIAGNOSIS — E538 Deficiency of other specified B group vitamins: Secondary | ICD-10-CM | POA: Diagnosis not present

## 2023-02-26 DIAGNOSIS — G629 Polyneuropathy, unspecified: Secondary | ICD-10-CM | POA: Diagnosis not present

## 2023-02-26 DIAGNOSIS — R739 Hyperglycemia, unspecified: Secondary | ICD-10-CM | POA: Diagnosis not present

## 2023-02-26 DIAGNOSIS — Z1389 Encounter for screening for other disorder: Secondary | ICD-10-CM | POA: Diagnosis not present

## 2023-02-26 DIAGNOSIS — I5021 Acute systolic (congestive) heart failure: Secondary | ICD-10-CM | POA: Diagnosis not present

## 2023-02-26 DIAGNOSIS — Z0131 Encounter for examination of blood pressure with abnormal findings: Secondary | ICD-10-CM | POA: Diagnosis not present

## 2023-02-26 DIAGNOSIS — L209 Atopic dermatitis, unspecified: Secondary | ICD-10-CM | POA: Diagnosis not present

## 2023-02-26 DIAGNOSIS — E782 Mixed hyperlipidemia: Secondary | ICD-10-CM | POA: Diagnosis not present

## 2023-03-05 ENCOUNTER — Other Ambulatory Visit: Payer: Self-pay | Admitting: *Deleted

## 2023-03-05 ENCOUNTER — Telehealth: Payer: Self-pay | Admitting: *Deleted

## 2023-03-05 DIAGNOSIS — Z1211 Encounter for screening for malignant neoplasm of colon: Secondary | ICD-10-CM

## 2023-03-05 MED ORDER — NA SULFATE-K SULFATE-MG SULF 17.5-3.13-1.6 GM/177ML PO SOLN: 1.0000 | Freq: Once | ORAL | 0 refills | Status: DC

## 2023-03-05 MED ORDER — NA SULFATE-K SULFATE-MG SULF 17.5-3.13-1.6 GM/177ML PO SOLN
1.0000 | Freq: Once | ORAL | 0 refills | Status: AC
Start: 2023-03-05 — End: 2023-03-05

## 2023-03-05 NOTE — Telephone Encounter (Signed)
Gastroenterology Pre-Procedure Review  Request Date: 04/17/2023 Requesting Physician: Dr. Tobi Bastos  PATIENT REVIEW QUESTIONS: The patient responded to the following health history questions as indicated:    1. Are you having any GI issues? no 2. Do you have a personal history of Polyps? no 3. Do you have a family history of Colon Cancer or Polyps? no 4. Diabetes Mellitus? yes (taking Farxiga ) 5. Joint replacements in the past 12 months?no 6. Major health problems in the past 3 months?no 7. Any artificial heart valves, MVP, or defibrillator?yes (chronic systolic hear failure)    MEDICATIONS & ALLERGIES:    Patient reports the following regarding taking any anticoagulation/antiplatelet therapy:   Plavix, Coumadin, Eliquis, Xarelto, Lovenox, Pradaxa, Brilinta, or Effient? no Aspirin? yes (325 mg)  Patient confirms/reports the following medications:  Current Outpatient Medications  Medication Sig Dispense Refill   allopurinol (ZYLOPRIM) 100 MG tablet Take 100 mg by mouth daily.     aspirin EC 325 MG tablet Take 325 mg by mouth daily.     busPIRone (BUSPAR) 10 MG tablet Take 10 mg by mouth 2 (two) times daily.     clobetasol cream (TEMOVATE) 0.05 % Apply topically.     clotrimazole (LOTRIMIN) 1 % cream Apply topically.     ENTRESTO 49-51 MG Take 1 tablet by mouth 2 (two) times daily.     FARXIGA 5 MG TABS tablet Take 5 mg by mouth daily.     furosemide (LASIX) 40 MG tablet Take 1 tablet (40 mg total) by mouth daily. 30 tablet 2   metoprolol succinate (TOPROL-XL) 50 MG 24 hr tablet Take 50 mg by mouth daily.     Omega-3 Fatty Acids (FISH OIL) 1000 MG CPDR Take 1,000 mg by mouth daily.      spironolactone (ALDACTONE) 25 MG tablet Take 25 mg by mouth daily.     vitamin B-12 (CYANOCOBALAMIN) 500 MCG tablet Take 500 mcg by mouth daily.     Zinc Sulfate (ZINC-220 PO) Take 220 mg by mouth daily.      gabapentin (NEURONTIN) 100 MG capsule Take 100 mg by mouth 5 (five) times daily as needed.  (Patient not taking: Reported on 03/05/2023)     Na Sulfate-K Sulfate-Mg Sulf 17.5-3.13-1.6 GM/177ML SOLN Take 1 kit by mouth once for 1 dose. 354 mL 0   No current facility-administered medications for this visit.    Patient confirms/reports the following allergies:  Allergies  Allergen Reactions   Codeine Nausea And Vomiting    No orders of the defined types were placed in this encounter.   AUTHORIZATION INFORMATION Primary Insurance: 1D#: Group #:  Secondary Insurance: 1D#: Group #:  SCHEDULE INFORMATION: Date: 04/17/2023 Time: Location:  [facility]

## 2023-03-26 ENCOUNTER — Ambulatory Visit: Payer: Medicaid Other | Admitting: Dermatology

## 2023-04-05 ENCOUNTER — Emergency Department
Admission: EM | Admit: 2023-04-05 | Discharge: 2023-04-07 | Disposition: A | Discharge status: Other Acute Inpatient Hospital | Payer: Medicaid Other | Attending: Emergency Medicine | Admitting: Emergency Medicine

## 2023-04-05 ENCOUNTER — Other Ambulatory Visit: Payer: Self-pay

## 2023-04-05 DIAGNOSIS — R443 Hallucinations, unspecified: Secondary | ICD-10-CM | POA: Diagnosis present

## 2023-04-05 DIAGNOSIS — F29 Unspecified psychosis not due to a substance or known physiological condition: Secondary | ICD-10-CM

## 2023-04-05 DIAGNOSIS — Z7982 Long term (current) use of aspirin: Secondary | ICD-10-CM | POA: Diagnosis not present

## 2023-04-05 DIAGNOSIS — F1490 Cocaine use, unspecified, uncomplicated: Secondary | ICD-10-CM

## 2023-04-05 DIAGNOSIS — F1910 Other psychoactive substance abuse, uncomplicated: Secondary | ICD-10-CM

## 2023-04-05 DIAGNOSIS — F1410 Cocaine abuse, uncomplicated: Secondary | ICD-10-CM | POA: Insufficient documentation

## 2023-04-05 LAB — URINE DRUG SCREEN, QUALITATIVE (ARMC ONLY)
Amphetamines, Ur Screen: NOT DETECTED
Barbiturates, Ur Screen: NOT DETECTED
Benzodiazepine, Ur Scrn: NOT DETECTED
Cannabinoid 50 Ng, Ur ~~LOC~~: NOT DETECTED
Cocaine Metabolite,Ur ~~LOC~~: POSITIVE — AB
MDMA (Ecstasy)Ur Screen: NOT DETECTED
Methadone Scn, Ur: NOT DETECTED
Opiate, Ur Screen: NOT DETECTED
Phencyclidine (PCP) Ur S: NOT DETECTED
Tricyclic, Ur Screen: POSITIVE — AB

## 2023-04-05 LAB — CBC
HCT: 43.4 % (ref 39.0–52.0)
Hemoglobin: 14.8 g/dL (ref 13.0–17.0)
MCH: 32.3 pg (ref 26.0–34.0)
MCHC: 34.1 g/dL (ref 30.0–36.0)
MCV: 94.8 fL (ref 80.0–100.0)
Platelets: 304 10*3/uL (ref 150–400)
RBC: 4.58 MIL/uL (ref 4.22–5.81)
RDW: 14.2 % (ref 11.5–15.5)
WBC: 9.4 10*3/uL (ref 4.0–10.5)
nRBC: 0 % (ref 0.0–0.2)

## 2023-04-05 LAB — COMPREHENSIVE METABOLIC PANEL
ALT: 21 U/L (ref 0–44)
AST: 30 U/L (ref 15–41)
Albumin: 4.3 g/dL (ref 3.5–5.0)
Alkaline Phosphatase: 73 U/L (ref 38–126)
Anion gap: 11 (ref 5–15)
BUN: 9 mg/dL (ref 6–20)
CO2: 26 mmol/L (ref 22–32)
Calcium: 9.5 mg/dL (ref 8.9–10.3)
Chloride: 97 mmol/L — ABNORMAL LOW (ref 98–111)
Creatinine, Ser: 1.27 mg/dL — ABNORMAL HIGH (ref 0.61–1.24)
GFR, Estimated: >60 mL/min (ref >60)
Glucose, Bld: 99 mg/dL (ref 70–99)
Potassium: 4.3 mmol/L (ref 3.5–5.1)
Sodium: 134 mmol/L — ABNORMAL LOW (ref 135–145)
Total Bilirubin: 0.8 mg/dL (ref 0.3–1.2)
Total Protein: 8.5 g/dL — ABNORMAL HIGH (ref 6.5–8.1)

## 2023-04-05 LAB — ETHANOL: Alcohol, Ethyl (B): <10 mg/dL (ref <10)

## 2023-04-05 NOTE — ED Triage Notes (Signed)
Pt arrives under IVC d/t hallucinations, allegations of drug and alcohol use and deemed a danger to himself and others. Pt denies SI, states "I see a whole lot of things that are there, but they disappear when others come around" when asked about hallucinations. Drank 1.5 twisted tea, smoke marijuana. Last cocaine use about a week ago.
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
## 2023-04-06 MED ORDER — SPIRONOLACTONE 25 MG PO TABS
25.0000 mg | ORAL_TABLET | Freq: Every day | ORAL | Status: DC
  Administered 2023-04-06: 25 mg via ORAL
  Filled 2023-04-06: qty 1

## 2023-04-06 MED ORDER — BUSPIRONE HCL 10 MG PO TABS
10.0000 mg | ORAL_TABLET | Freq: Every day | ORAL | Status: DC
  Administered 2023-04-06 – 2023-04-07 (×2): 10 mg via ORAL
  Filled 2023-04-06: qty 2
  Filled 2023-04-06: qty 1

## 2023-04-06 MED ORDER — FUROSEMIDE 20 MG PO TABS
20.0000 mg | ORAL_TABLET | Freq: Every day | ORAL | Status: DC
  Administered 2023-04-06 – 2023-04-07 (×2): 20 mg via ORAL
  Filled 2023-04-06 (×2): qty 1

## 2023-04-06 MED ORDER — AMITRIPTYLINE HCL 50 MG PO TABS
50.0000 mg | ORAL_TABLET | Freq: Every day | ORAL | Status: DC
  Administered 2023-04-06: 50 mg via ORAL
  Filled 2023-04-06: qty 1

## 2023-04-06 MED ORDER — ALLOPURINOL 100 MG PO TABS
100.0000 mg | ORAL_TABLET | Freq: Every day | ORAL | Status: DC
  Administered 2023-04-06 – 2023-04-07 (×2): 100 mg via ORAL
  Filled 2023-04-06 (×2): qty 1

## 2023-04-06 MED ORDER — FENOFIBRATE 54 MG PO TABS
54.0000 mg | ORAL_TABLET | Freq: Every day | ORAL | Status: DC
  Administered 2023-04-06 – 2023-04-07 (×2): 54 mg via ORAL
  Filled 2023-04-06 (×2): qty 1

## 2023-04-06 MED ORDER — SACUBITRIL-VALSARTAN 49-51 MG PO TABS
1.0000 | ORAL_TABLET | Freq: Every day | ORAL | Status: DC
  Administered 2023-04-06 – 2023-04-07 (×2): 1 via ORAL
  Filled 2023-04-06 (×2): qty 1

## 2023-04-06 NOTE — ED Notes (Signed)
Report called to [facility] and given to Thornton Dales, RN.

## 2023-04-06 NOTE — ED Notes (Signed)
IVC /recommend psych inpatient when medically cleared

## 2023-04-06 NOTE — ED Notes (Addendum)
Pt gave verbal permission to speak with sister, Dale Leggett @ [phone] to obtain current medication list as pt pharm is closed. Staff contacted Ms Corine Shelter, she will c/b with information

## 2023-04-06 NOTE — BH Assessment (Signed)
Patient has been accepted to [facility]. Patient assigned to room 407-01 Accepting physician is Dr. Abbott Pao Call report to [phone] Representative was Coralee [facility].  ER Staff is aware of it: Ronnie, ER Secretary Dr. Vicente Males, ER MD K. Cedric Fishman Patient's Nurse

## 2023-04-06 NOTE — ED Notes (Signed)
Patient is IVC pending inpatient admit when medically cleared 

## 2023-04-06 NOTE — ED Notes (Signed)
Pt provided breakfast, was reoriented.  Staff discussed pt obtaining inpatient services, pt is agreeable.  Pt stated "I'm not hallucinating, I have conformation from other that it was real."  Pt continues to be calm and cooperative

## 2023-04-06 NOTE — ED Notes (Signed)
IVC/pt accepted to [facility]

## 2023-04-06 NOTE — BH Assessment (Signed)
Upon pt's permission, this writer attempted to contact pt's sister for collateral; however, there was no answer/ability to leave voicemail x2. Psych team to follow up.
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
## 2023-04-06 NOTE — ED Notes (Signed)
IVC/pt moved to [facility]
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
## 2023-04-06 NOTE — Consult Note (Signed)
Telepsych Consultation   Reason for Consult:  Homicidal ideation, IVC   Referring Physician:  Chiquita Loth Location of Patient:  [facility] Location of Provider: [facility]  Patient Identification: Edward [name] MRN:  [id number] Principal Diagnosis: <principal problem not specified> Diagnosis:  Active Problems:   * No active hospital problems. *   Total Time spent with patient: 30 minutes  Subjective:   Edward [name] is a 57 y.o. male patient with psychiatric history of alcohol abuse presented to [facility] ED under IVC due to hallucination and paranoia.   Patient was assessed via J[facility] Bonito by this NP. Patient presents with significant symptoms of paranoia, expressing concerns about people stalking him from the woods and plotting to rob him. He also reports that he contacted law enforcement multiple times regarding a gang he believes is harassing him from the woods. He reported that he is able to see and hear these individuals taunting him, and he insisted that his sister could corroborate his claims. The patient reported sporadic cocaine use, with the last use occurring the previous week. He admitted to using an unspecified amount of marijuana and consuming one and a half twisted tea drinks the previous day.  His current blood alcohol level (BAL) was unremarkable, and UDS was positive for cocaine.   During assessment, patient is alert and oriented, his mood is anxious mood and his affect is congruent mood. He was cooperative and pleasant during the assessment, though his speech was tangential and he exhibited perseveration, frequently returning to the theme of being stalked and threatened. His thought processes were disorganized. He lacks insight into his current condition and his judgment is impaired as he continues to expressed desire for discharge despite his ongoing symptoms. He denies suicidal or homicidal ideation.  Objectively, he  did not exhibit any signs of auditory or visual  hallucinations at the time of assessment, although he had reported experiencing such symptoms.     Past Psychiatric History:  Alcohol Abuse, polysubstance abuse     Risk to Self:   Risk to Others:   Prior Inpatient Therapy:   Prior Outpatient Therapy:    Past Medical History:  Past Medical History:  Diagnosis Date   Alcohol abuse    Colon polyp 2016   Herpes    MI (myocardial infarction) (HCC)    Polysubstance abuse (HCC)    Vitamin B 12 deficiency     Past Surgical History:  Procedure Laterality Date   ANAL FISTULOTOMY  04/03/2016   Procedure: ANAL FISTULOTOMY;  Surgeon: Kieth Brightly, MD;  Location: [facility] ORS;  Service: General;;   BLADDER SURGERY     COLONOSCOPY  07/06/2015   Dr Shelle Iron   KNEE ARTHROSCOPY Left    LEFT HEART CATH AND CORONARY ANGIOGRAPHY N/A 12/19/2018   Procedure: LEFT HEART CATH AND CORONARY ANGIOGRAPHY;  Surgeon: Antonieta Iba, MD;  Location: [facility] INVASIVE CV LAB;  Service: Cardiovascular;  Laterality: N/A;   UPPER GASTROINTESTINAL ENDOSCOPY  07/06/2015   Family History:  Family History  Problem Relation Age of Onset   Colon cancer Maternal Grandmother    Prostate cancer Paternal Uncle    Leukemia Paternal Uncle    Heart failure Father    CAD Sister    Family Psychiatric  History:  Family History  Problem Relation Age of Onset   Colon cancer Maternal Grandmother    Prostate cancer Paternal Uncle    Leukemia Paternal Uncle    Heart failure Father    CAD Sister  Social History:  Social History   Substance and Sexual Activity  Alcohol Use Yes   Comment: moderate use- does not like to quantify     Social History   Substance and Sexual Activity  Drug Use Yes   Types: Marijuana, Cocaine   Comment: cocaine use a few days ago     Social History   Socioeconomic History   Marital status: Single    Spouse name: Not on file   Number of children: Not on file   Years of education: Not on file   Highest education level: Not on  file  Occupational History   Not on file  Tobacco Use   Smoking status: Never   Smokeless tobacco: Never  Vaping Use   Vaping status: Never Used  Substance and Sexual Activity   Alcohol use: Yes    Comment: moderate use- does not like to quantify   Drug use: Yes    Types: Marijuana, Cocaine    Comment: cocaine use a few days ago    Sexual activity: Not on file  Other Topics Concern   Not on file  Social History Narrative   Is at home by himself.  Independent   Social Determinants of Health   Financial Resource Strain: Low Risk  (12/15/2020)   Received from [facility]   Overall Financial Resource Strain (CARDIA)    Difficulty of Paying Living Expenses: Not hard at all  Food Insecurity: No Food Insecurity (12/15/2020)   Received from [facility]   Hunger Vital Sign    Worried About Running Out of Food in the Last Year: Never true    Ran Out of Food in the Last Year: Never true  Transportation Needs: No Transportation Needs (12/15/2020)   Received from [facility]    Lack of Transportation (Medical): No    Lack of Transportation (Non-Medical): No  Physical Activity: Not on file  Stress: Not on file  Social Connections: Not on file   Additional Social History:    Allergies:   Allergies  Allergen Reactions   Codeine Nausea And Vomiting    Labs:  Results for orders placed or performed during the hospital encounter of 04/05/23 (from the past 48 hour(s))  Comprehensive metabolic panel     Status: Abnormal   Collection Time: 04/05/23 10:04 PM  Result Value Ref Range   Sodium 134 (L) 135 - 145 mmol/L   Potassium 4.3 3.5 - 5.1 mmol/L   Chloride 97 (L) 98 - 111 mmol/L   CO2 26 22 - 32 mmol/L   Glucose, Bld 99 70 - 99 mg/dL    Comment: Glucose reference range applies only to samples taken after fasting for at least 8 hours.   BUN 9 6 - 20 mg/dL   Creatinine, Ser 1.61 (H) 0.61 - 1.24 mg/dL   Calcium 9.5 8.9 - 09.6 mg/dL   Total  Protein 8.5 (H) 6.5 - 8.1 g/dL   Albumin 4.3 3.5 - 5.0 g/dL   AST 30 15 - 41 U/L   ALT 21 0 - 44 U/L   Alkaline Phosphatase 73 38 - 126 U/L   Total Bilirubin 0.8 0.3 - 1.2 mg/dL   GFR, Estimated >04 >54 mL/min    Comment: (NOTE) Calculated using the CKD-EPI Creatinine Equation (2021)    Anion gap 11 5 - 15    Comment: Performed at [facility], [address] [place], [place] [address]  Ethanol  Status: None   Collection Time: 04/05/23 10:04 PM  Result Value Ref Range   Alcohol, Ethyl (B) <10 <10 mg/dL    Comment: (NOTE) Lowest detectable limit for serum alcohol is 10 mg/dL.  For medical purposes only. Performed at [facility], [address] [place], [place] [address]   cbc     Status: None   Collection Time: 04/05/23 10:04 PM  Result Value Ref Range   WBC 9.4 4.0 - 10.5 K/uL   RBC 4.58 4.22 - 5.81 MIL/uL   Hemoglobin 14.8 13.0 - 17.0 g/dL   HCT 95.6 38.7 - 56.4 %   MCV 94.8 80.0 - 100.0 fL   MCH 32.3 26.0 - 34.0 pg   MCHC 34.1 30.0 - 36.0 g/dL   RDW 33.2 95.1 - 88.4 %   Platelets 304 150 - 400 K/uL   nRBC 0.0 0.0 - 0.2 %    Comment: Performed at [facility], [address], [place], [place] [address]  Urine Drug Screen, Qualitative     Status: Abnormal   Collection Time: 04/05/23 10:04 PM  Result Value Ref Range   Tricyclic, Ur Screen POSITIVE (A) NONE DETECTED   Amphetamines, Ur Screen NONE DETECTED NONE DETECTED   MDMA (Ecstasy)Ur Screen NONE DETECTED NONE DETECTED   Cocaine Metabolite,Ur [place] POSITIVE (A) NONE DETECTED   Opiate, Ur Screen NONE DETECTED NONE DETECTED   Phencyclidine (PCP) Ur S NONE DETECTED NONE DETECTED   Cannabinoid 50 Ng, Ur [place] NONE DETECTED NONE DETECTED   Barbiturates, Ur Screen NONE DETECTED NONE DETECTED   Benzodiazepine, Ur Scrn NONE DETECTED NONE DETECTED   Methadone Scn, Ur NONE DETECTED NONE DETECTED    Comment: (NOTE) Tricyclics + metabolites, urine    Cutoff 1000 ng/mL Amphetamines + metabolites,  urine  Cutoff 1000 ng/mL MDMA (Ecstasy), urine              Cutoff 500 ng/mL Cocaine Metabolite, urine          Cutoff 300 ng/mL Opiate + metabolites, urine        Cutoff 300 ng/mL Phencyclidine (PCP), urine         Cutoff 25 ng/mL Cannabinoid, urine                 Cutoff 50 ng/mL Barbiturates + metabolites, urine  Cutoff 200 ng/mL Benzodiazepine, urine              Cutoff 200 ng/mL Methadone, urine                   Cutoff 300 ng/mL  The urine drug screen provides only a preliminary, unconfirmed analytical test result and should not be used for non-medical purposes. Clinical consideration and professional judgment should be applied to any positive drug screen result due to possible interfering substances. A more specific alternate chemical method must be used in order to obtain a confirmed analytical result. Gas chromatography / mass spectrometry (GC/MS) is the preferred confirm atory method. Performed at [facility], [address] [place], [place] [address]     Medications:  No current facility-administered medications for this encounter.   Current Outpatient Medications  Medication Sig Dispense Refill   allopurinol (ZYLOPRIM) 100 MG tablet Take 100 mg by mouth daily.     amitriptyline (ELAVIL) 50 MG tablet Take 50 mg by mouth at bedtime.     aspirin EC 325 MG tablet Take 325 mg by mouth daily.     busPIRone (BUSPAR) 10 MG tablet Take  10 mg by mouth 2 (two) times daily.     clobetasol cream (TEMOVATE) 0.05 % Apply topically.     clotrimazole (LOTRIMIN) 1 % cream Apply topically.     ENTRESTO 49-51 MG Take 1 tablet by mouth 2 (two) times daily.     FARXIGA 5 MG TABS tablet Take 5 mg by mouth daily.     furosemide (LASIX) 40 MG tablet Take 1 tablet (40 mg total) by mouth daily. 30 tablet 2   metoprolol succinate (TOPROL-XL) 50 MG 24 hr tablet Take 50 mg by mouth daily.     Omega-3 Fatty Acids (FISH OIL) 1000 MG CPDR Take 1,000 mg by mouth daily.       spironolactone (ALDACTONE) 25 MG tablet Take 25 mg by mouth daily.     vitamin B-12 (CYANOCOBALAMIN) 500 MCG tablet Take 500 mcg by mouth daily.     Zinc Sulfate (ZINC-220 PO) Take 220 mg by mouth daily.      gabapentin (NEURONTIN) 100 MG capsule Take 100 mg by mouth 5 (five) times daily as needed. (Patient not taking: Reported on 03/05/2023)      Musculoskeletal: Strength & Muscle Tone: within normal limits Gait & Station: normal Patient leans: Right          Psychiatric Specialty Exam:  Presentation  General Appearance:  Appropriate for Environment  Eye Contact: Good  Speech: Clear and Coherent  Speech Volume: Normal  Handedness: Right   Mood and Affect  Mood: Euthymic  Affect: Congruent   Thought Process  Thought Processes: Disorganized  Descriptions of Associations:Tangential  Orientation:Full (Time, Place and Person)  Thought Content:WDL  History of Schizophrenia/Schizoaffective disorder:No data recorded Duration of Psychotic Symptoms:No data recorded Hallucinations:Hallucinations: None  Ideas of Reference:Paranoia; Delusions  Suicidal Thoughts:Suicidal Thoughts: No  Homicidal Thoughts:Homicidal Thoughts: No   Sensorium  Memory: Immediate Fair; Recent Poor; Remote Poor  Judgment: Impaired  Insight: Poor   Executive Functions  Concentration: Good  Attention Span: Good  Recall: Fair  Fund of Knowledge: Good  Language: Good   Psychomotor Activity  Psychomotor Activity: Psychomotor Activity: Normal   Assets  Assets: Desire for Improvement; Social Support; Physical Health   Sleep  Sleep: Sleep: Fair Number of Hours of Sleep: 5    Physical Exam: Physical Exam Vitals reviewed.  Constitutional:      General: He is not in acute distress.    Appearance: He is not ill-appearing.  HENT:     Head: Normocephalic and atraumatic.     Right Ear: External ear normal.     Left Ear: External ear normal.     Nose:  Nose normal.  Cardiovascular:     Rate and Rhythm: Normal rate.     Pulses: Normal pulses.  Pulmonary:     Effort: Pulmonary effort is normal.  Neurological:     Mental Status: He is alert and oriented to person, place, and time.  Psychiatric:        Attention and Perception: Attention normal. He is attentive. He does not perceive auditory or visual hallucinations.        Mood and Affect: Mood is anxious and depressed.        Speech: Speech normal.        Behavior: Behavior normal. Behavior is cooperative.        Thought Content: Thought content is paranoid and delusional.        Cognition and Memory: Cognition normal.    Review of Systems  Constitutional: Negative.   Neurological: Negative.  Endo/Heme/Allergies: Negative.   Psychiatric/Behavioral:  Positive for hallucinations and substance abuse. The patient is nervous/anxious.    Blood pressure (!) 130/101, pulse 64, temperature 98.2 F (36.8 C), temperature source Oral, resp. rate 16, SpO2 99%. There is no height or weight on file to calculate BMI.  Treatment Plan Summary: Daily contact with patient to assess and evaluate symptoms and progress in treatment  Disposition: Recommend psychiatric Inpatient admission when medically cleared.  This service was provided via telemedicine using a 2-way, interactive audio and video technology.  Names of all persons participating in this telemedicine service and their role in this encounter. Name: Edward [name]  Role: Patient  Name: Cecilio Asper Role: NP  Name: Rosalita Chessman Role: Gillian Shields, NP 04/06/2023 4:50 AM

## 2023-04-06 NOTE — ED Notes (Signed)
Called [employer] for availability for an officer to transport pt to [facility]/waiting for call back

## 2023-04-06 NOTE — ED Provider Notes (Signed)
Emergency department handoff note  Care of this patient was signed out to me at the end of the previous provider shift.  All pertinent patient information was conveyed and all questions were answered.  Patient is medically cleared for psychiatric placement secondary to psychosis at this time   Merwyn Katos, MD 04/06/23 1742

## 2023-04-06 NOTE — ED Notes (Signed)
Patient has been accepted to [facility] /assigned to room 407-01 accepting physician is Dr. Abbott Pao call report to [phone] rep was Coralee North

## 2023-04-07 ENCOUNTER — Other Ambulatory Visit: Payer: Self-pay

## 2023-04-07 ENCOUNTER — Inpatient Hospital Stay (HOSPITAL_COMMUNITY)
Admission: AD | Admit: 2023-04-07 | Discharge: 2023-04-09 | DRG: 897 | Disposition: A | Discharge status: Home / Self Care | Payer: Medicaid Other | Source: Intra-hospital | Attending: Psychiatry | Admitting: Psychiatry

## 2023-04-07 ENCOUNTER — Encounter (HOSPITAL_COMMUNITY): Payer: Self-pay | Admitting: Psychiatric/Mental Health

## 2023-04-07 DIAGNOSIS — F19959 Other psychoactive substance use, unspecified with psychoactive substance-induced psychotic disorder, unspecified: Secondary | ICD-10-CM | POA: Diagnosis present

## 2023-04-07 DIAGNOSIS — Z8249 Family history of ischemic heart disease and other diseases of the circulatory system: Secondary | ICD-10-CM

## 2023-04-07 DIAGNOSIS — F19980 Other psychoactive substance use, unspecified with psychoactive substance-induced anxiety disorder: Secondary | ICD-10-CM | POA: Diagnosis present

## 2023-04-07 DIAGNOSIS — Z79899 Other long term (current) drug therapy: Secondary | ICD-10-CM | POA: Diagnosis not present

## 2023-04-07 DIAGNOSIS — Z7982 Long term (current) use of aspirin: Secondary | ICD-10-CM | POA: Diagnosis not present

## 2023-04-07 DIAGNOSIS — F1490 Cocaine use, unspecified, uncomplicated: Secondary | ICD-10-CM | POA: Diagnosis not present

## 2023-04-07 DIAGNOSIS — I252 Old myocardial infarction: Secondary | ICD-10-CM | POA: Diagnosis not present

## 2023-04-07 DIAGNOSIS — Z86010 Personal history of colonic polyps: Secondary | ICD-10-CM

## 2023-04-07 DIAGNOSIS — Z806 Family history of leukemia: Secondary | ICD-10-CM | POA: Diagnosis not present

## 2023-04-07 DIAGNOSIS — Z800 Family history of malignant neoplasm of digestive organs: Secondary | ICD-10-CM | POA: Diagnosis not present

## 2023-04-07 DIAGNOSIS — F19951 Other psychoactive substance use, unspecified with psychoactive substance-induced psychotic disorder with hallucinations: Secondary | ICD-10-CM | POA: Diagnosis not present

## 2023-04-07 DIAGNOSIS — Z8042 Family history of malignant neoplasm of prostate: Secondary | ICD-10-CM | POA: Diagnosis not present

## 2023-04-07 DIAGNOSIS — F19950 Other psychoactive substance use, unspecified with psychoactive substance-induced psychotic disorder with delusions: Secondary | ICD-10-CM | POA: Diagnosis not present

## 2023-04-07 DIAGNOSIS — F29 Unspecified psychosis not due to a substance or known physiological condition: Secondary | ICD-10-CM | POA: Diagnosis not present

## 2023-04-07 LAB — BASIC METABOLIC PANEL
Anion gap: 10 (ref 5–15)
BUN: 12 mg/dL (ref 6–20)
CO2: 25 mmol/L (ref 22–32)
Calcium: 9 mg/dL (ref 8.9–10.3)
Chloride: 99 mmol/L (ref 98–111)
Creatinine, Ser: 1.31 mg/dL — ABNORMAL HIGH (ref 0.61–1.24)
GFR, Estimated: >60 mL/min (ref >60)
Glucose, Bld: 157 mg/dL — ABNORMAL HIGH (ref 70–99)
Potassium: 3.5 mmol/L (ref 3.5–5.1)
Sodium: 134 mmol/L — ABNORMAL LOW (ref 135–145)

## 2023-04-07 MED ORDER — MAGNESIUM HYDROXIDE 400 MG/5ML PO SUSP: 30.0000 mL | Freq: Every day | ORAL | Status: DC | PRN

## 2023-04-07 MED ORDER — ACETAMINOPHEN 325 MG PO TABS
650.0000 mg | ORAL_TABLET | Freq: Four times a day (QID) | ORAL | Status: DC | PRN
  Administered 2023-04-08 (×3): 650 mg via ORAL
  Filled 2023-04-07 (×3): qty 2

## 2023-04-07 MED ORDER — RISPERIDONE 1 MG PO TBDP
1.0000 mg | ORAL_TABLET | Freq: Every day | ORAL | Status: DC
  Administered 2023-04-07 – 2023-04-08 (×2): 1 mg via ORAL
  Filled 2023-04-07 (×4): qty 1

## 2023-04-07 MED ORDER — LORAZEPAM 1 MG PO TABS: 2.0000 mg | ORAL_TABLET | Freq: Three times a day (TID) | ORAL | Status: DC | PRN

## 2023-04-07 MED ORDER — HALOPERIDOL 5 MG PO TABS: 5.0000 mg | ORAL_TABLET | Freq: Three times a day (TID) | ORAL | Status: DC | PRN

## 2023-04-07 MED ORDER — LORAZEPAM 2 MG/ML IJ SOLN: 2.0000 mg | Freq: Three times a day (TID) | INTRAMUSCULAR | Status: DC | PRN

## 2023-04-07 MED ORDER — HYDROXYZINE HCL 10 MG PO TABS: 10.0000 mg | ORAL_TABLET | Freq: Three times a day (TID) | ORAL | Status: DC | PRN

## 2023-04-07 MED ORDER — TRAZODONE HCL 50 MG PO TABS
50.0000 mg | ORAL_TABLET | Freq: Every evening | ORAL | Status: DC | PRN
  Filled 2023-04-07: qty 1

## 2023-04-07 MED ORDER — HYDROXYZINE HCL 25 MG PO TABS
25.0000 mg | ORAL_TABLET | Freq: Three times a day (TID) | ORAL | Status: DC | PRN
  Filled 2023-04-07: qty 1

## 2023-04-07 MED ORDER — ALUM & MAG HYDROXIDE-SIMETH 200-200-20 MG/5ML PO SUSP: 30.0000 mL | ORAL | Status: DC | PRN

## 2023-04-07 MED ORDER — DIPHENHYDRAMINE HCL 50 MG/ML IJ SOLN: 50.0000 mg | Freq: Three times a day (TID) | INTRAMUSCULAR | Status: DC | PRN

## 2023-04-07 MED ORDER — DIPHENHYDRAMINE HCL 25 MG PO CAPS: 50.0000 mg | ORAL_CAPSULE | Freq: Three times a day (TID) | ORAL | Status: DC | PRN

## 2023-04-07 MED ORDER — HALOPERIDOL LACTATE 5 MG/ML IJ SOLN: 5.0000 mg | Freq: Three times a day (TID) | INTRAMUSCULAR | Status: DC | PRN

## 2023-04-07 NOTE — ED Notes (Signed)
Staff called [facility] main number @ [phone], no answer on adult unit.  Attempted to reach admissions department, with no answer.

## 2023-04-07 NOTE — ED Notes (Signed)
EMTALA reviewed by this RN.  
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
## 2023-04-07 NOTE — ED Notes (Signed)
Pt is A/Ox 4, Edward [name] denies anycurrent  SI/HI stated that he is not having any A/V hallucinations.   Pt was agreeable to transfer All Belongings accounted for and given to safe transport with patient.  Pt left safe transport via [facility]

## 2023-04-07 NOTE — ED Notes (Signed)
Called [facility] again, @ [phone], phone range for 3 min.s without answer

## 2023-04-07 NOTE — [facility] Group Notes (Signed)
[facility] Group Notes:  (Nursing/MHT/Case Management/Adjunct)  Date:  04/07/2023  Time:  8:27 PM  Type of Therapy:   Wrap-up group  Participation Level:  Did Not Attend  Participation Quality:    Affect:    Cognitive:    Insight:    Engagement in Group:    Modes of Intervention:    Summary of Progress/Problems: Refused to attend group.  Noah Delaine 04/07/2023, 8:27 PM
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
## 2023-04-07 NOTE — [facility] Suicide Risk Assessment (Signed)
[facility] Admission Suicide Risk Assessment   Nursing information obtained from:    Demographic factors:    Current Mental Status:    Loss Factors:    Historical Factors:    Risk Reduction Factors:     Total Time spent with patient: 30 minutes Principal Problem: Substance-induced psychotic disorder (HCC) Diagnosis:  Principal Problem:   Substance-induced psychotic disorder (HCC)  Subjective Data: 57 year old Caucasian male with no prior psychiatric history admitted with symptoms of acute paranoia and agitation.  Continued Clinical Symptoms:    The "Alcohol Use Disorders Identification Test", Guidelines for Use in Primary Care, Second Edition.  World Science writer [facility]). Score between 0-7:  no or low risk or alcohol related problems. Score between 8-15:  moderate risk of alcohol related problems. Score between 16-19:  high risk of alcohol related problems. Score 20 or above:  warrants further diagnostic evaluation for alcohol dependence and treatment.   CLINICAL FACTORS:   Currently Psychotic   Musculoskeletal: Strength & Muscle Tone: within normal limits Gait & Station: normal Patient leans: N/A  Psychiatric Specialty Exam:  Presentation  General Appearance:  Appropriate for Environment  Eye Contact: Good  Speech: Clear and Coherent  Speech Volume: Normal  Handedness: Right   Mood and Affect  Mood: Euthymic  Affect: Congruent   Thought Process  Thought Processes: Disorganized  Descriptions of Associations:Tangential  Orientation:Full (Time, Place and Person)  Thought Content:WDL  History of Schizophrenia/Schizoaffective disorder:No data recorded Duration of Psychotic Symptoms:No data recorded Hallucinations:Hallucinations: None  Ideas of Reference:Paranoia; Delusions  Suicidal Thoughts:Suicidal Thoughts: No  Homicidal Thoughts:Homicidal Thoughts: No   Sensorium  Memory: Immediate Fair; Recent Poor; Remote  Poor  Judgment: Impaired  Insight: Poor   Executive Functions  Concentration: Good  Attention Span: Good  Recall: Fair  Fund of Knowledge: Good  Language: Good   Psychomotor Activity  Psychomotor Activity: Psychomotor Activity: Normal   Assets  Assets: Desire for Improvement; Social Support; Physical Health   Sleep  Sleep: Sleep: Fair Number of Hours of Sleep: 5    Physical Exam: Physical Exam Constitutional:      Appearance: Normal appearance.  Neurological:     General: No focal deficit present.     Mental Status: He is alert and oriented to person, place, and time. Mental status is at baseline.    Review of Systems  Psychiatric/Behavioral:  Positive for hallucinations. The patient is nervous/anxious.    There were no vitals taken for this visit. There is no height or weight on file to calculate BMI.   COGNITIVE FEATURES THAT CONTRIBUTE TO RISK:  Thought constriction (tunnel vision)    SUICIDE RISK:   Mild:  Suicidal ideation of limited frequency, intensity, duration, and specificity.  There are no identifiable plans, no associated intent, mild dysphoria and related symptoms, good self-control (both objective and subjective assessment), few other risk factors, and identifiable protective factors, including available and accessible social support.  PLAN OF CARE: The patient is being admitted to the inpatient unit in a safe and secure environment for further observation and assessment.  His IVC is being upheld.  I certify that inpatient services furnished can reasonably be expected to improve the patient's condition.   Rex Kras, MD 04/07/2023, 11:47 AM

## 2023-04-07 NOTE — Tx Team (Signed)
Initial Treatment Plan 04/07/2023 3:23 PM Ermalinda Memos Rama ZOX:[id number]    PATIENT STRESSORS:    PATIENT STRENGTHS: Average or above average intelligence  Capable of independent living  Communication skills  Motivation for treatment/growth  Supportive family/friends  Work skills    PATIENT IDENTIFIED PROBLEMS: Paranoia/ hallucinations         "I don't need to be here"     "I'm being harassed"         DISCHARGE CRITERIA:  Improved stabilization in mood, thinking, and/or behavior Need for constant or close observation no longer present Verbal commitment to aftercare and medication compliance  PRELIMINARY DISCHARGE PLAN: Outpatient therapy Return to previous living arrangement Return to previous work or school arrangements  PATIENT/FAMILY INVOLVEMENT: This treatment plan has been presented to and reviewed with the patient, DENYS STEITZ, .  The patient has been given the opportunity to ask questions and make suggestions.  Shela Nevin, RN 04/07/2023, 3:23 PM

## 2023-04-07 NOTE — ED Notes (Signed)
Called [facility] to verify bed, they reported they would have the charge call back after report was completed.

## 2023-04-07 NOTE — ED Notes (Signed)
Breakfast tray provided. 

## 2023-04-07 NOTE — ED Provider Notes (Signed)
Emergency Medicine Observation Re-evaluation Note  Edward [name] is a 57 y.o. male, seen on rounds today.  Pt initially presented to the ED for complaints of Psychiatric Evaluation Currently, the patient is resting comfortably.  Physical Exam  BP 108/74 (BP Location: Left Arm)   Pulse 60   Temp 97.9 F (36.6 C) (Oral)   Resp 18   SpO2 100%  Physical Exam General: No acute distress Cardiac: No cyanosis Lungs: Normal work of breathing Psych: Calm and cooperative  ED Course / MDM  EKG:   I have reviewed the labs performed to date as well as medications administered while in observation.  Recent changes in the last 24 hours include no acute events.  Plan  Current plan is for admittance to [facility] pending room.    Janith Lima, MD 04/07/23 [phone]

## 2023-04-07 NOTE — Group Note (Signed)
LCSW Group Therapy Note  Group Date: 04/07/2023 Start Time: 1000 End Time: 1100   Type of Therapy and Topic:  Group Therapy: Anger Cues and Responses  Participation Level:  Did Not Attend   Description of Group:   In this group, patients learned how to recognize the physical, cognitive, emotional, and behavioral responses they have to anger-provoking situations.  They identified a recent time they became angry and how they reacted.  They analyzed how their reaction was possibly beneficial and how it was possibly unhelpful.  The group discussed a variety of healthier coping skills that could help with such a situation in the future.  Focus was placed on how helpful it is to recognize the underlying emotions to our anger, because working on those can lead to a more permanent solution as well as our ability to focus on the important rather than the urgent.  Therapeutic Goals: Patients will remember their last incident of anger and how they felt emotionally and physically, what their thoughts were at the time, and how they behaved. Patients will identify how their behavior at that time worked for them, as well as how it worked against them. Patients will explore possible new behaviors to use in future anger situations. Patients will learn that anger itself is normal and cannot be eliminated, and that healthier reactions can assist with resolving conflict rather than worsening situations.  Therapeutic Modalities:   Cognitive Behavioral Therapy    Marinda Elk, [place] 04/07/2023  1:17 PM

## 2023-04-08 ENCOUNTER — Encounter (HOSPITAL_COMMUNITY): Payer: Self-pay

## 2023-04-08 DIAGNOSIS — F19959 Other psychoactive substance use, unspecified with psychoactive substance-induced psychotic disorder, unspecified: Secondary | ICD-10-CM | POA: Diagnosis not present

## 2023-04-08 MED ORDER — ALLOPURINOL 100 MG PO TABS
100.0000 mg | ORAL_TABLET | Freq: Every day | ORAL | Status: DC
  Administered 2023-04-09: 100 mg via ORAL
  Filled 2023-04-08 (×2): qty 1

## 2023-04-08 MED ORDER — FUROSEMIDE 40 MG PO TABS
40.0000 mg | ORAL_TABLET | Freq: Every day | ORAL | Status: DC
  Administered 2023-04-08 – 2023-04-09 (×2): 40 mg via ORAL
  Filled 2023-04-08 (×4): qty 1

## 2023-04-08 MED ORDER — ALLOPURINOL 100 MG PO TABS
100.0000 mg | ORAL_TABLET | Freq: Every day | ORAL | Status: DC
  Filled 2023-04-08 (×2): qty 1

## 2023-04-08 MED ORDER — BUSPIRONE HCL 10 MG PO TABS
10.0000 mg | ORAL_TABLET | Freq: Two times a day (BID) | ORAL | Status: DC
  Administered 2023-04-08 – 2023-04-09 (×2): 10 mg via ORAL
  Filled 2023-04-08: qty 2
  Filled 2023-04-08 (×4): qty 1

## 2023-04-08 MED ORDER — ASPIRIN 325 MG PO TBEC
325.0000 mg | DELAYED_RELEASE_TABLET | Freq: Every day | ORAL | Status: DC
  Administered 2023-04-08 – 2023-04-09 (×2): 325 mg via ORAL
  Filled 2023-04-08 (×4): qty 1

## 2023-04-08 MED ORDER — SPIRONOLACTONE 25 MG PO TABS
25.0000 mg | ORAL_TABLET | Freq: Every day | ORAL | Status: DC
  Administered 2023-04-08 – 2023-04-09 (×2): 25 mg via ORAL
  Filled 2023-04-08 (×4): qty 1

## 2023-04-08 MED ORDER — METOPROLOL SUCCINATE ER 50 MG PO TB24
50.0000 mg | ORAL_TABLET | Freq: Every day | ORAL | Status: DC
  Administered 2023-04-08 – 2023-04-09 (×2): 50 mg via ORAL
  Filled 2023-04-08 (×4): qty 1

## 2023-04-08 MED ORDER — SACUBITRIL-VALSARTAN 49-51 MG PO TABS
1.0000 | ORAL_TABLET | Freq: Two times a day (BID) | ORAL | Status: DC
  Administered 2023-04-08 – 2023-04-09 (×2): 1 via ORAL
  Filled 2023-04-08 (×5): qty 1

## 2023-04-08 NOTE — BH IP Treatment Plan (Signed)
Interdisciplinary Treatment and Diagnostic Plan Update  04/08/2023 Time of Session: 11:15 AM  Edward [name] MRN: [id number]  Principal Diagnosis: Substance-induced psychotic disorder [facility])  Secondary Diagnoses: Principal Problem:   Substance-induced psychotic disorder (HCC)   Current Medications:  Current Facility-Administered Medications  Medication Dose Route Frequency Provider Last Rate Last Admin   acetaminophen (TYLENOL) tablet 650 mg  650 mg Oral Q6H PRN Penn, Cranston Neighbor, NP   650 mg at 04/08/23 0610   alum & mag hydroxide-simeth (MAALOX/MYLANTA) 200-200-20 MG/5ML suspension 30 mL  30 mL Oral Q4H PRN Penn, Cicely, NP       diphenhydrAMINE (BENADRYL) capsule 50 mg  50 mg Oral TID PRN Mcneil Sober, NP       Or   diphenhydrAMINE (BENADRYL) injection 50 mg  50 mg Intramuscular TID PRN Penn, Cranston Neighbor, NP       haloperidol (HALDOL) tablet 5 mg  5 mg Oral TID PRN Mcneil Sober, NP       Or   haloperidol lactate (HALDOL) injection 5 mg  5 mg Intramuscular TID PRN Mcneil Sober, NP       hydrOXYzine (ATARAX) tablet 25 mg  25 mg Oral TID PRN Rex Kras, MD       LORazepam (ATIVAN) tablet 2 mg  2 mg Oral TID PRN Mcneil Sober, NP       Or   LORazepam (ATIVAN) injection 2 mg  2 mg Intramuscular TID PRN Penn, Cranston Neighbor, NP       magnesium hydroxide (MILK OF MAGNESIA) suspension 30 mL  30 mL Oral Daily PRN Penn, Cicely, NP       risperiDONE (RISPERDAL M-TABS) disintegrating tablet 1 mg  1 mg Oral QHS Rex Kras, MD   1 mg at 04/07/23 2119   traZODone (DESYREL) tablet 50 mg  50 mg Oral QHS PRN Rex Kras, MD       PTA Medications: Medications Prior to Admission  Medication Sig Dispense Refill Last Dose   allopurinol (ZYLOPRIM) 100 MG tablet Take 100 mg by mouth daily.      amitriptyline (ELAVIL) 50 MG tablet Take 50 mg by mouth at bedtime.      aspirin EC 325 MG tablet Take 325 mg by mouth daily.      busPIRone (BUSPAR) 10 MG tablet Take 10 mg by mouth 2 (two) times daily.       clobetasol cream (TEMOVATE) 0.05 % Apply topically. (Patient not taking: Reported on 04/06/2023)      clotrimazole (LOTRIMIN) 1 % cream Apply topically. (Patient not taking: Reported on 04/06/2023)      ENTRESTO 49-51 MG Take 1 tablet by mouth 2 (two) times daily.      FARXIGA 5 MG TABS tablet Take 5 mg by mouth daily.      furosemide (LASIX) 40 MG tablet Take 1 tablet (40 mg total) by mouth daily. 30 tablet 2    gabapentin (NEURONTIN) 100 MG capsule Take 100 mg by mouth 5 (five) times daily as needed. (Patient not taking: Reported on 03/05/2023)      metoprolol succinate (TOPROL-XL) 50 MG 24 hr tablet Take 50 mg by mouth daily.      Omega-3 Fatty Acids (FISH OIL) 1000 MG CPDR Take 1,000 mg by mouth daily.  (Patient not taking: Reported on 04/06/2023)      spironolactone (ALDACTONE) 25 MG tablet Take 25 mg by mouth daily.      vitamin B-12 (CYANOCOBALAMIN) 500 MCG tablet Take 500 mcg by mouth daily. (Patient not taking: Reported on  04/06/2023)      Zinc Sulfate (ZINC-220 PO) Take 220 mg by mouth daily.  (Patient not taking: Reported on 04/06/2023)       Patient Stressors:    Patient Strengths: Average or above average intelligence  Capable of independent living  Communication skills  Motivation for treatment/growth  Supportive family/friends  Work skills   Treatment Modalities: Medication Management, Group therapy, Case management,  1 to 1 session with clinician, Psychoeducation, Recreational therapy.   Physician Treatment Plan for Primary Diagnosis: Substance-induced psychotic disorder (HCC) Long Term Goal(s): Improvement in symptoms so as ready for discharge   Short Term Goals: Ability to identify changes in lifestyle to reduce recurrence of condition will improve Ability to verbalize feelings will improve Ability to disclose and discuss suicidal ideas Ability to identify triggers associated with substance abuse/mental health issues will improve Ability to identify and develop effective coping  behaviors will improve Compliance with prescribed medications will improve  Medication Management: Evaluate patient's response, side effects, and tolerance of medication regimen.  Therapeutic Interventions: 1 to 1 sessions, Unit Group sessions and Medication administration.  Evaluation of Outcomes: Not Progressing  Physician Treatment Plan for Secondary Diagnosis: Principal Problem:   Substance-induced psychotic disorder (HCC)  Long Term Goal(s): Improvement in symptoms so as ready for discharge   Short Term Goals: Ability to identify changes in lifestyle to reduce recurrence of condition will improve Ability to verbalize feelings will improve Ability to disclose and discuss suicidal ideas Ability to identify triggers associated with substance abuse/mental health issues will improve Ability to identify and develop effective coping behaviors will improve Compliance with prescribed medications will improve     Medication Management: Evaluate patient's response, side effects, and tolerance of medication regimen.  Therapeutic Interventions: 1 to 1 sessions, Unit Group sessions and Medication administration.  Evaluation of Outcomes: Not Progressing   RN Treatment Plan for Primary Diagnosis: Substance-induced psychotic disorder (HCC) Long Term Goal(s): Knowledge of disease and therapeutic regimen to maintain health will improve  Short Term Goals: Ability to remain free from injury will improve, Ability to verbalize frustration and anger appropriately will improve, Ability to demonstrate self-control, Ability to participate in decision making will improve, Ability to verbalize feelings will improve, Ability to disclose and discuss suicidal ideas, Ability to identify and develop effective coping behaviors will improve, and Compliance with prescribed medications will improve  Medication Management: RN will administer medications as ordered by provider, will assess and evaluate patient's  response and provide education to patient for prescribed medication. RN will report any adverse and/or side effects to prescribing provider.  Therapeutic Interventions: 1 on 1 counseling sessions, Psychoeducation, Medication administration, Evaluate responses to treatment, Monitor vital signs and CBGs as ordered, Perform/monitor CIWA, COWS, AIMS and Fall Risk screenings as ordered, Perform wound care treatments as ordered.  Evaluation of Outcomes: Not Progressing   LCSW Treatment Plan for Primary Diagnosis: Substance-induced psychotic disorder [facility]) Long Term Goal(s): Safe transition to appropriate next level of care at discharge, Engage patient in therapeutic group addressing interpersonal concerns.  Short Term Goals: Engage patient in aftercare planning with referrals and resources, Increase social support, Increase ability to appropriately verbalize feelings, Increase emotional regulation, Facilitate acceptance of mental health diagnosis and concerns, Facilitate patient progression through stages of change regarding substance use diagnoses and concerns, Identify triggers associated with mental health/substance abuse issues, and Increase skills for wellness and recovery  Therapeutic Interventions: Assess for all discharge needs, 1 to 1 time with Social worker, Explore available resources and support systems,  Assess for adequacy in community support network, Educate family and significant other(s) on suicide prevention, Complete Psychosocial Assessment, Interpersonal group therapy.  Evaluation of Outcomes: Not Progressing   Progress in Treatment: Attending groups: No. Participating in groups: No. Taking medication as prescribed: No. Toleration medication: No.Pt asked to get start back on his meds he was taking at home today during treatment team Family/Significant other contact made: Yes, individual(s) contacted:  Ashley Royalty  Patient understands diagnosis: Yes. Discussing patient identified  problems/goals with staff: Yes. Medical problems stabilized or resolved: Yes. Denies suicidal/homicidal ideation: Yes. Issues/concerns per patient self-inventory: No.   New problem(s) identified: No, Describe:  None reported   New Short Term/Long Term Goal(s):medication stabilization, elimination of SI thoughts, development of comprehensive mental wellness plan.    Patient Goals:  " go home "   Discharge Plan or Barriers: Patient recently admitted. CSW will continue to follow and assess for appropriate referrals and possible discharge planning.    Reason for Continuation of Hospitalization: Hallucinations Medication stabilization Withdrawal symptoms  Estimated Length of Stay: 4-6 days   Last 3 [place] Suicide Severity Risk Score: Flowsheet Row [facility]/2023 [facility] at [facility]  C-SSRS RISK CATEGORY No Risk No Risk       Last PHQ 2/9 Scores:    05/18/2019   10:44 AM  Depression screen PHQ 2/9  Decreased Interest 0  Down, Depressed, Hopeless 1  PHQ - 2 Score 1  Altered sleeping 2  Tired, decreased energy 0  Change in appetite 0  Feeling bad or failure about yourself  0  Trouble concentrating 0  Moving slowly or fidgety/restless 0  Suicidal thoughts 0  PHQ-9 Score 3    Scribe for Treatment Team: Beather Arbour 04/08/2023 12:36 PM

## 2023-04-08 NOTE — [facility] Suicide Risk Assessment (Signed)
[facility] INPATIENT:  Family/Significant Other Suicide Prevention Education  Suicide Prevention Education:  Education Completed; Kennyth Arnold ( girlfriend ) [phone] ,  (name of family member/significant other) has been identified by the patient as the family member/significant other with whom the patient will be residing, and identified as the person(s) who will aid the patient in the event of a mental health crisis (suicidal ideations/suicide attempt).  With written consent from the patient, the family member/significant other has been provided the following suicide prevention education, prior to the and/or following the discharge of the patient.  Safety planning was completed with GF. GF confirmed same exact story as pt. GF stated that patient is not crazy and should not be here at the hospital because he was only trying to protect his property. GF states that she to seen the people on the ring camera and heard them also; Then shared that the police did not no anything the times patient called them. GF shared that the gun that patient had, she gave it to pt brother-in-law. Pt will return back to his home with GF.   The suicide prevention education provided includes the following: Suicide risk factors Suicide prevention and interventions National Suicide Hotline telephone number [facility] assessment telephone number [facility] Emergency Assistance 911 [facility] and/or Residential Mobile Crisis Unit telephone number  Request made of family/significant other to: Remove weapons (e.g., guns, rifles, knives), all items previously/currently identified as safety concern.   Remove drugs/medications (over-the-counter, prescriptions, illicit drugs), all items previously/currently identified as a safety concern.  The family member/significant other verbalizes understanding of the suicide prevention education information provided.  The family member/significant other agrees to remove the  items of safety concern listed above.  Isabella Bowens 04/08/2023, 10:19 AM

## 2023-04-08 NOTE — Group Note (Deleted)

## 2023-04-08 NOTE — [facility] Group Notes (Signed)
[facility] Group Notes:  (Nursing/MHT/Case Management/Adjunct)  Date:  04/08/2023  Time:  8:49 PM  Type of Therapy:   AA groujp  Participation Level:  Did Not Attend  Participation Quality:    Affect:    Cognitive:    Insight:    Engagement in Group:  Engaged  Modes of Intervention:  Education  Summary of Progress/Problems:  Edward [name] 04/08/2023, 8:49 PM

## 2023-04-08 NOTE — Group Note (Signed)
Recreation Therapy Group Note   Group Topic:Stress Management Group Date: 04/08/2023 Start Time: 0945 End Time: 1010 Facilitators: Kionna Brier-McCall, LRT,CTRS Location: [address] Dayroom   Group Topic: Stress Management  Goal Area(s) Addresses:  Patient will identify positive stress management techniques. Patient will identify benefits of using stress management post d/c.  Group Description: Meditation.  LRT played a meditation that focused on being resilient in the face of struggle. Patients were to listen to the meditation as it played to engage. LRT also informed patients of being able to get more stress management resources from, apps, scripts, Youtube, internet, etc.   Education:  Stress Management, Discharge Planning.   Education Outcome: Acknowledges Education   Affect/Mood: Appropriate   Participation Level: N/A   Participation Quality: N/A   Behavior: N/A   Speech/Thought Process: N/A   Insight: N/A   Judgement: N/A   Modes of Intervention: Stress Management   Patient Response to Interventions:  N/A   Education Outcome:  In group clarification offered    Clinical Observations/Individualized Feedback: Pt was called out of group to meet with doctor as session was starting. Pt did not return.    Plan: Continue to engage patient in RT group sessions 2-3x/week.   Maryruth Apple-McCall, LRT,CTRS 04/08/2023 12:41 PM
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
## 2023-04-08 NOTE — Progress Notes (Signed)
   04/07/23 2119  Psych Admission Type (Psych Patients Only)  Admission Status Involuntary  Psychosocial Assessment  Patient Complaints None  Eye Contact Fair  Facial Expression Worried  Affect Sad  Speech Logical/coherent  Interaction Assertive  Motor Activity Other (Comment)  Appearance/Hygiene Unremarkable  Behavior Characteristics Cooperative;Calm  Mood Pleasant  Thought Process  Coherency WDL  Content WDL  Delusions None reported or observed  Perception WDL  Hallucination None reported or observed  Judgment Impaired  Confusion None  Danger to Self  Current suicidal ideation? Denies  Agreement Not to Harm Self Yes  Description of Agreement verbal  Danger to Others  Danger to Others None reported or observed

## 2023-04-08 NOTE — Progress Notes (Signed)
Patient appears depressed. Patient denies SI/HI/AVH. Pt reports anxiety is 1/10 and depression is 1/10. Pt reports good sleep and good appetite. Patient requested home medications be restarted, MD notified. Pt states "I just wanna go home". Patient remains safe on Q61min checks and contracts for safety.      04/08/23 0904  Psych Admission Type (Psych Patients Only)  Admission Status Involuntary  Psychosocial Assessment  Patient Complaints None  Eye Contact Fair  Facial Expression Anxious  Affect Sad;Depressed  Speech Logical/coherent  Interaction Assertive  Motor Activity Fidgety  Appearance/Hygiene Unremarkable  Behavior Characteristics Cooperative;Irritable  Mood Pleasant;Irritable  Thought Process  Coherency WDL  Content WDL  Delusions None reported or observed  Perception WDL  Hallucination None reported or observed  Judgment Impaired  Confusion None  Danger to Self  Current suicidal ideation? Denies  Agreement Not to Harm Self Yes  Description of Agreement verbal  Danger to Others  Danger to Others None reported or observed

## 2023-04-08 NOTE — Progress Notes (Signed)
   04/08/23 0541  15 Minute Checks  Location Bedroom  Visual Appearance Calm  Behavior Sleeping  Sleep (Behavioral Health Patients Only)  Calculate sleep? (Click Yes once per 24 hr at 0600 safety check) Yes  Documented sleep last 24 hours 5.5

## 2023-04-08 NOTE — Group Note (Signed)
Date:  04/08/2023 Time:  10:11 AM  Group Topic/Focus:  Goals Group:   The focus of this group is to help patients establish daily goals to achieve during treatment and discuss how the patient can incorporate goal setting into their daily lives to aide in recovery. Orientation:   The focus of this group is to educate the patient on the purpose and policies of crisis stabilization and provide a format to answer questions about their admission.  The group details unit policies and expectations of patients while admitted.    Participation Level:  Active  Participation Quality:  Attentive  Affect:  Appropriate  Cognitive:  Appropriate  Insight: Appropriate  Engagement in Group:  Engaged  Modes of Intervention:  Discussion  Additional Comments: Patient attended goals group and was attentive the duration of it.Patient's goal was to come up with a discharge plan.    Muaad Boehning T Redford Behrle 04/08/2023, 10:11 AM

## 2023-04-08 NOTE — Plan of Care (Signed)
Problem: Education: Goal: Knowledge of [facility] Education information/materials will improve Outcome: Progressing Goal: Emotional status will improve Outcome: Progressing Goal: Mental status will improve Outcome: Progressing Goal: Verbalization of understanding the information provided will improve Outcome: Progressing   Problem: Activity: Goal: Interest or engagement in activities will improve Outcome: Progressing Goal: Sleeping patterns will improve Outcome: Progressing   Problem: Coping: Goal: Ability to verbalize frustrations and anger appropriately will improve Outcome: Progressing Goal: Ability to demonstrate self-control will improve Outcome: Progressing   Problem: Health Behavior/Discharge Planning: Goal: Identification of resources available to assist in meeting health care needs will improve Outcome: Progressing Goal: Compliance with treatment plan for underlying cause of condition will improve Outcome: Progressing   Problem: Physical Regulation: Goal: Ability to maintain clinical measurements within normal limits will improve Outcome: Progressing   Problem: Safety: Goal: Periods of time without injury will increase Outcome: Progressing   Problem: Education: Goal: Knowledge of [facility] Education information/materials will improve Outcome: Progressing Goal: Emotional status will improve Outcome: Progressing Goal: Mental status will improve Outcome: Progressing Goal: Verbalization of understanding the information provided will improve Outcome: Progressing   Problem: Activity: Goal: Interest or engagement in activities will improve Outcome: Progressing Goal: Sleeping patterns will improve Outcome: Progressing   Problem: Coping: Goal: Ability to verbalize frustrations and anger appropriately will improve Outcome: Progressing Goal: Ability to demonstrate self-control will improve Outcome: Progressing   Problem: Health  Behavior/Discharge Planning: Goal: Identification of resources available to assist in meeting health care needs will improve Outcome: Progressing Goal: Compliance with treatment plan for underlying cause of condition will improve Outcome: Progressing   Problem: Physical Regulation: Goal: Ability to maintain clinical measurements within normal limits will improve Outcome: Progressing   Problem: Safety: Goal: Periods of time without injury will increase Outcome: Progressing   Problem: Education: Goal: Ability to state activities that reduce stress will improve Outcome: Progressing   Problem: Coping: Goal: Ability to identify and develop effective coping behavior will improve Outcome: Progressing   Problem: Self-Concept: Goal: Ability to identify factors that promote anxiety will improve Outcome: Progressing Goal: Level of anxiety will decrease Outcome: Progressing Goal: Ability to modify response to factors that promote anxiety will improve Outcome: Progressing   Problem: Education: Goal: Knowledge of disease or condition will improve Outcome: Progressing Goal: Understanding of discharge needs will improve Outcome: Progressing   Problem: Health Behavior/Discharge Planning: Goal: Ability to identify changes in lifestyle to reduce recurrence of condition will improve Outcome: Progressing Goal: Identification of resources available to assist in meeting health care needs will improve Outcome: Progressing   Problem: Physical Regulation: Goal: Complications related to the disease process, condition or treatment will be avoided or minimized Outcome: Progressing   Problem: Safety: Goal: Ability to remain free from injury will improve Outcome: Progressing   Problem: Activity: Goal: Will verbalize the importance of balancing activity with adequate rest periods Outcome: Progressing   Problem: Education: Goal: Will be free of psychotic symptoms Outcome: Progressing Goal:  Knowledge of the prescribed therapeutic regimen will improve Outcome: Progressing   Problem: Coping: Goal: Coping ability will improve Outcome: Progressing Goal: Will verbalize feelings Outcome: Progressing   Problem: Health Behavior/Discharge Planning: Goal: Compliance with prescribed medication regimen will improve Outcome: Progressing   Problem: Nutritional: Goal: Ability to achieve adequate nutritional intake will improve Outcome: Progressing   Problem: Role Relationship: Goal: Ability to communicate needs accurately will improve Outcome: Progressing Goal: Ability to interact with others will improve Outcome: Progressing   Problem: Safety: Goal: Ability  to redirect hostility and anger into socially appropriate behaviors will improve Outcome: Progressing Goal: Ability to remain free from injury will improve Outcome: Progressing   Problem: Self-Care: Goal: Ability to participate in self-care as condition permits will improve Outcome: Progressing   Problem: Self-Concept: Goal: Will verbalize positive feelings about self Outcome: Progressing

## 2023-04-08 NOTE — Plan of Care (Signed)
  Problem: Education: Goal: Emotional status will improve Outcome: Not Progressing Goal: Mental status will improve Outcome: Not Progressing   
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
## 2023-04-08 NOTE — Group Note (Signed)
Occupational Therapy Group Note  Group Topic: Sleep Hygiene  Group Date: 04/08/2023 Start Time: 1430 End Time: 1505 Facilitators: Ted Mcalpine, OT   Group Description: Group encouraged increased participation and engagement through topic focused on sleep hygiene. Patients reflected on the quality of sleep they typically receive and identified areas that need improvement. Group was given background information on sleep and sleep hygiene, including common sleep disorders. Group members also received information on how to improve one's sleep and introduced a sleep diary as a tool that can be utilized to track sleep quality over a length of time. Group session ended with patients identifying one or more strategies they could utilize or implement into their sleep routine in order to improve overall sleep quality.        Therapeutic Goal(s):  Identify one or more strategies to improve overall sleep hygiene  Identify one or more areas of sleep that are negatively impacted (sleep too much, too little, etc)     Participation Level: Engaged   Participation Quality: Independent   Behavior: Attentive    Speech/Thought Process: Ideas of reference    Affect/Mood: flat   Insight: Fair   Judgement: Fair      Modes of Intervention: Education  Patient Response to Interventions:  Engaged   Plan: Continue to engage patient in OT groups 2 - 3x/week.  04/08/2023  Ted Mcalpine, OT Kerrin Champagne, OT

## 2023-04-09 DIAGNOSIS — F19959 Other psychoactive substance use, unspecified with psychoactive substance-induced psychotic disorder, unspecified: Secondary | ICD-10-CM | POA: Diagnosis not present

## 2023-04-09 LAB — BASIC METABOLIC PANEL
Anion gap: 9 (ref 5–15)
BUN: 11 mg/dL (ref 6–20)
CO2: 25 mmol/L (ref 22–32)
Calcium: 8.6 mg/dL — ABNORMAL LOW (ref 8.9–10.3)
Chloride: 102 mmol/L (ref 98–111)
Creatinine, Ser: 1.29 mg/dL — ABNORMAL HIGH (ref 0.61–1.24)
GFR, Estimated: >60 mL/min (ref >60)
Glucose, Bld: 100 mg/dL — ABNORMAL HIGH (ref 70–99)
Potassium: 3.9 mmol/L (ref 3.5–5.1)
Sodium: 136 mmol/L (ref 135–145)

## 2023-04-09 LAB — LIPID PANEL
Cholesterol: 161 mg/dL (ref 0–200)
HDL: 45 mg/dL (ref >40)
LDL Cholesterol: 88 mg/dL (ref 0–99)
Total CHOL/HDL Ratio: 3.6 ratio
Triglycerides: 142 mg/dL (ref <150)
VLDL: 28 mg/dL (ref 0–40)

## 2023-04-09 LAB — TSH: TSH: 1.251 u[IU]/mL (ref 0.350–4.500)

## 2023-04-09 MED ORDER — RISPERIDONE 1 MG PO TBDP: 1.0000 mg | ORAL_TABLET | Freq: Every day | ORAL | 0 refills | Status: AC
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
## 2023-04-09 NOTE — Progress Notes (Signed)
Pt discharged at this time. Pt removed all belongings and verbalized understanding of medications and discharge instructions. Pt denies SI/HI/AVH.

## 2023-04-09 NOTE — Progress Notes (Signed)
  [facility] Adult Case Management Discharge Plan :  Will you be returning to the same living situation after discharge:  Yes,  Pt will be returning back to his home  At discharge, do you have transportation home?: Yes,  Pt sister will be picking him up from the hospital  Do you have the ability to pay for your medications: Yes,  [place] Medicaid Prepaid Plan / [place] Medicaid [employer]   Release of information consent forms completed and in the chart;  Patient's signature needed at discharge.  Patient to Follow up at:  Follow-up Information     [facility] [employer]. Go to.   Why: Please go to this provider for an assessment, to obtain group therapy and medication management services.  They are open 24/7. Contact information: [address] [place] [address] [phone]                 Next level of care provider has access to [facility] Link:no  Safety Planning and Suicide Prevention discussed: Yes,  Kennyth Arnold ( girlfriend ) [phone]     Has patient been referred to the Quitline?: Patient does not use tobacco/nicotine products. Patient does smoke Marijuana   Patient has been referred for addiction treatment: Patient refused referral for treatment.Patient stated that he uses marijuana and cocaine sometimes   Beather Arbour 04/09/2023, 10:55 AM

## 2023-04-09 NOTE — [facility] Suicide Risk Assessment (Addendum)
Suicide Risk Assessment  Discharge Assessment    [facility] Discharge Suicide Risk Assessment   Principal Problem: Substance-induced psychotic disorder [facility]) Discharge Diagnoses: Principal Problem:   Substance-induced psychotic disorder (HCC)  Reason for admission: The patient is a 57 year old Caucasian single male with no prior psychiatric history who was admitted through the [facility] ED under IVC due to hallucinations and paranoia. This was primarily obtained from the records and patient interview.  In addition collateral was obtained from his sister.  According to the records, apparently the patient presented with significant symptoms of paranoia, expressing concern about people stalking him from the wards and camping out in the woods behind his house.  Apparently they are plotting to rob him.  He claims that he has contacted a Pensions consultant multiple times regarding a gang of the individuals who are related to his girlfriend who are apparently released from jail recently.  He claims that they are trying to harass him and taunt him.  Records also indicate possible alcohol use and cocaine use.  He has not used unspecified amount of marijuana recently.  When he was seen in the [facility] his UDS was positive for cocaine.  Total Time spent with patient: 30 minutes  Musculoskeletal: Strength & Muscle Tone: within normal limits Gait & Station: normal Patient leans: N/A  Psychiatric Specialty Exam  Presentation  General Appearance:  Appropriate for Environment; Casual; Fairly Groomed  Eye Contact: Good  Speech: Clear and Coherent; Normal Rate  Speech Volume: Normal  Handedness: Right  Mood and Affect  Mood: Euthymic  Duration of Depression Symptoms: No data recorded Affect: Congruent  Thought Process  Thought Processes: Coherent  Descriptions of Associations:Intact  Orientation:Full (Time, Place and Person)  Thought Content:Logical; WDL  History of Schizophrenia/Schizoaffective  disorder:No data recorded Duration of Psychotic Symptoms:No data recorded Hallucinations:Hallucinations: None  Ideas of Reference:None  Suicidal Thoughts:Suicidal Thoughts: No  Homicidal Thoughts:Homicidal Thoughts: No HI Passive Intent and/or Plan: -- (n/a)  Sensorium  Memory: Immediate Good; Recent Good  Judgment: Fair  Insight: Fair  Executive Functions  Concentration: Good  Attention Span: Good  Recall: Good  Fund of Knowledge: Fair  Language: Good  Psychomotor Activity  Psychomotor Activity: Psychomotor Activity: Normal  Assets  Assets: Communication Skills; Desire for Improvement; Housing; Physical Health; Resilience; Social Support  Sleep  Sleep: Sleep: Good Number of Hours of Sleep: 8.5  Physical Exam: Physical Exam Vitals and nursing note reviewed.  HENT:     Head: Normocephalic.     Nose: Nose normal.     Mouth/Throat:     Mouth: Mucous membranes are moist.  Eyes:     Extraocular Movements: Extraocular movements intact.  Cardiovascular:     Rate and Rhythm: Normal rate.     Pulses: Normal pulses.  Pulmonary:     Effort: Pulmonary effort is normal.  Abdominal:     Comments: Deferred  Genitourinary:    Comments: Deferred Musculoskeletal:        General: Normal range of motion.     Cervical back: Normal range of motion.  Skin:    General: Skin is warm.  Neurological:     General: No focal deficit present.     Mental Status: He is alert and oriented to person, place, and time.  Psychiatric:        Mood and Affect: Mood normal.        Behavior: Behavior normal.        Thought Content: Thought content normal.    Review of Systems  Neurological:  Negative for dizziness, tingling, tremors, sensory change and headaches.  Endo/Heme/Allergies:        See allergy listing  Psychiatric/Behavioral:  The patient is nervous/anxious (Improved with medication).    Blood pressure 100/78, pulse 87, temperature 97.7 F (36.5 C),  temperature source Oral, resp. rate 16, height 6' (1.829 m), weight 90.7 kg, SpO2 96%. Body mass index is 27.12 kg/m.  Mental Status Per Nursing Assessment::   On Admission:  NA  Demographic Factors:  Male, Caucasian, Access to firearms, and firearm locked up by sister  Loss Factors: Legal issues and reports some people stooled he was drunk 2 weeks ago.  Historical Factors: Impulsivity  Risk Reduction Factors:   Positive social support, Positive therapeutic relationship, and Positive coping skills or problem solving skills  Continued Clinical Symptoms:  Severe Anxiety and/or Agitation Alcohol/Substance Abuse/Dependencies Medical Diagnoses and Treatments/Surgeries  Cognitive Features That Contribute To Risk:  Polarized thinking    Suicide Risk:  Mild: There are no identifiable plans, no associated intent, mild dysphoria and related symptoms, good self-control (both objective and subjective assessment), few other risk factors, and identifiable protective factors, including available and accessible social support.   Follow-up Information     [facility] [employer]. Go to.   Why: Please go to this provider for an assessment, to obtain group therapy and medication management services.  They are open 24/7. Contact information: [address] [place] [address] [phone]                 Plan Of Care/Follow-up recommendations:  Discharge Recommendations:  The patient is being discharged to home. Patient is to take his discharge medications as ordered.  See follow up above. We recommend that he participates in individual therapy to target uncontrollable agitation and substance abuse.  We recommend that he participates in therapy to target personal conflict, to improve communication skills and conflict resolution skills. Patient is to initiate/implement a contingency based behavioral model to address his behavior. We recommend that he gets AIMS scale, height,  weight, blood pressure, fasting lipid panel, fasting blood sugar in three months from discharge if he's on atypical antipsychotics.  Patient will benefit from monitoring of recurrent suicidal ideation since patient is on antidepressant medication. The patient should abstain from all illicit substances and alcohol. If the patient's symptoms worsen or do not continue to improve or if the patient becomes actively suicidal or homicidal then it is recommended that the patient return to the closest hospital emergency room or call 911 for further evaluation and treatment. National Suicide Prevention Lifeline 1800-SUICIDE or 6056137247. Please follow up with your primary medical doctor for all other medical needs.  The patient has been educated on the possible side effects to medications and she/her guardian is to contact a medical professional and inform outpatient provider of any new side effects of medication. He is to take regular diet and activity as tolerated.  Will benefit from moderate daily exercise. Patient and Family was educated about removing/locking any firearms, medications or dangerous products from the home.  Activity:  As tolerated Diet:  Regular Diet  Cecilie Lowers, FNP 04/09/2023, 11:08 AM

## 2023-04-09 NOTE — Progress Notes (Signed)
   04/08/23 2322  Psych Admission Type (Psych Patients Only)  Admission Status Involuntary  Psychosocial Assessment  Patient Complaints None  Eye Contact Fair  Facial Expression Pained  Affect Depressed  Speech Logical/coherent  Interaction Assertive  Motor Activity Other (Comment) (WDL)  Appearance/Hygiene Unremarkable  Behavior Characteristics Cooperative;Calm  Mood Pleasant  Thought Process  Coherency WDL  Content WDL  Delusions None reported or observed  Perception WDL  Hallucination None reported or observed  Judgment Impaired  Confusion None  Danger to Self  Current suicidal ideation? Denies  Agreement Not to Harm Self Yes  Description of Agreement verbal  Danger to Others  Danger to Others None reported or observed

## 2023-04-10 LAB — HEMOGLOBIN A1C
Hgb A1c MFr Bld: 5.9 % — ABNORMAL HIGH (ref 4.8–5.6)
Mean Plasma Glucose: 123 mg/dL

## 2023-04-12 ENCOUNTER — Telehealth: Payer: Self-pay | Admitting: *Deleted

## 2023-04-12 DIAGNOSIS — Z0130 Encounter for examination of blood pressure without abnormal findings: Secondary | ICD-10-CM | POA: Diagnosis not present

## 2023-04-12 DIAGNOSIS — F1910 Other psychoactive substance abuse, uncomplicated: Secondary | ICD-10-CM | POA: Diagnosis not present

## 2023-04-12 DIAGNOSIS — Z1389 Encounter for screening for other disorder: Secondary | ICD-10-CM | POA: Diagnosis not present

## 2023-04-12 DIAGNOSIS — M545 Low back pain, unspecified: Secondary | ICD-10-CM | POA: Diagnosis not present

## 2023-04-12 DIAGNOSIS — I10 Essential (primary) hypertension: Secondary | ICD-10-CM | POA: Diagnosis not present

## 2023-04-12 DIAGNOSIS — I5021 Acute systolic (congestive) heart failure: Secondary | ICD-10-CM | POA: Diagnosis not present

## 2023-04-12 DIAGNOSIS — Z1331 Encounter for screening for depression: Secondary | ICD-10-CM | POA: Diagnosis not present

## 2023-04-12 NOTE — Telephone Encounter (Signed)
Received clearance from patient PCP.  Patient is clear to have procedure and should stop taking aspirin 5 days prior and restart 2 days.  Tried to call patient but could not leave message since VM is not set up  Tried 2 times today.

## 2023-04-15 NOTE — Telephone Encounter (Signed)
Tried to call patient but could not leave message since VM is not set up

## 2023-04-17 ENCOUNTER — Encounter: Admission: RE | Payer: Self-pay | Source: Ambulatory Visit

## 2023-04-17 ENCOUNTER — Ambulatory Visit: Admission: RE | Admit: 2023-04-17 | Payer: Medicaid Other | Source: Ambulatory Visit | Admitting: Gastroenterology

## 2023-04-17 SURGERY — COLONOSCOPY WITH PROPOFOL
Anesthesia: General

## 2023-06-10 DIAGNOSIS — I5020 Unspecified systolic (congestive) heart failure: Secondary | ICD-10-CM | POA: Diagnosis not present

## 2023-06-10 DIAGNOSIS — E782 Mixed hyperlipidemia: Secondary | ICD-10-CM | POA: Diagnosis not present

## 2023-06-10 DIAGNOSIS — Z0130 Encounter for examination of blood pressure without abnormal findings: Secondary | ICD-10-CM | POA: Diagnosis not present

## 2023-06-10 DIAGNOSIS — I10 Essential (primary) hypertension: Secondary | ICD-10-CM | POA: Diagnosis not present

## 2023-06-10 DIAGNOSIS — G629 Polyneuropathy, unspecified: Secondary | ICD-10-CM | POA: Diagnosis not present

## 2023-06-10 DIAGNOSIS — Z1389 Encounter for screening for other disorder: Secondary | ICD-10-CM | POA: Diagnosis not present

## 2023-06-10 DIAGNOSIS — Z125 Encounter for screening for malignant neoplasm of prostate: Secondary | ICD-10-CM | POA: Diagnosis not present

## 2023-06-10 DIAGNOSIS — R7309 Other abnormal glucose: Secondary | ICD-10-CM | POA: Diagnosis not present

## 2023-12-25 DIAGNOSIS — Z1389 Encounter for screening for other disorder: Secondary | ICD-10-CM | POA: Diagnosis not present

## 2023-12-25 DIAGNOSIS — I5020 Unspecified systolic (congestive) heart failure: Secondary | ICD-10-CM | POA: Diagnosis not present

## 2023-12-25 DIAGNOSIS — Z0131 Encounter for examination of blood pressure with abnormal findings: Secondary | ICD-10-CM | POA: Diagnosis not present

## 2023-12-25 DIAGNOSIS — I10 Essential (primary) hypertension: Secondary | ICD-10-CM | POA: Diagnosis not present

## 2024-01-03 DIAGNOSIS — Z0130 Encounter for examination of blood pressure without abnormal findings: Secondary | ICD-10-CM | POA: Diagnosis not present

## 2024-01-03 DIAGNOSIS — I5020 Unspecified systolic (congestive) heart failure: Secondary | ICD-10-CM | POA: Diagnosis not present

## 2024-01-03 DIAGNOSIS — R490 Dysphonia: Secondary | ICD-10-CM | POA: Diagnosis not present

## 2024-01-03 DIAGNOSIS — Z1389 Encounter for screening for other disorder: Secondary | ICD-10-CM | POA: Diagnosis not present

## 2024-01-14 DIAGNOSIS — I5022 Chronic systolic (congestive) heart failure: Secondary | ICD-10-CM | POA: Diagnosis not present

## 2024-01-14 DIAGNOSIS — F1010 Alcohol abuse, uncomplicated: Secondary | ICD-10-CM | POA: Diagnosis not present

## 2024-01-14 DIAGNOSIS — I5020 Unspecified systolic (congestive) heart failure: Secondary | ICD-10-CM | POA: Diagnosis not present

## 2024-02-04 DIAGNOSIS — I5020 Unspecified systolic (congestive) heart failure: Secondary | ICD-10-CM | POA: Diagnosis not present

## 2024-03-09 DIAGNOSIS — H6121 Impacted cerumen, right ear: Secondary | ICD-10-CM | POA: Diagnosis not present

## 2024-03-09 DIAGNOSIS — H9201 Otalgia, right ear: Secondary | ICD-10-CM | POA: Diagnosis not present
